# Patient Record
Sex: Male | Born: 1965 | Race: White | Hispanic: No | Smoking: Former smoker
Health system: Southern US, Academic
[De-identification: ages and names within clinical notes are randomized; demographics above are authoritative.]

## PROBLEM LIST (undated history)

## (undated) DIAGNOSIS — M542 Cervicalgia: Secondary | ICD-10-CM

## (undated) DIAGNOSIS — Z87898 Personal history of other specified conditions: Secondary | ICD-10-CM

## (undated) DIAGNOSIS — R29898 Other symptoms and signs involving the musculoskeletal system: Secondary | ICD-10-CM

## (undated) DIAGNOSIS — Z973 Presence of spectacles and contact lenses: Secondary | ICD-10-CM

## (undated) DIAGNOSIS — K069 Disorder of gingiva and edentulous alveolar ridge, unspecified: Secondary | ICD-10-CM

## (undated) DIAGNOSIS — M109 Gout, unspecified: Secondary | ICD-10-CM

## (undated) DIAGNOSIS — I679 Cerebrovascular disease, unspecified: Secondary | ICD-10-CM

## (undated) DIAGNOSIS — Z9981 Dependence on supplemental oxygen: Secondary | ICD-10-CM

## (undated) DIAGNOSIS — E785 Hyperlipidemia, unspecified: Secondary | ICD-10-CM

## (undated) DIAGNOSIS — M4802 Spinal stenosis, cervical region: Secondary | ICD-10-CM

## (undated) DIAGNOSIS — G8929 Other chronic pain: Secondary | ICD-10-CM

## (undated) DIAGNOSIS — J449 Chronic obstructive pulmonary disease, unspecified: Secondary | ICD-10-CM

## (undated) DIAGNOSIS — M5416 Radiculopathy, lumbar region: Secondary | ICD-10-CM

## (undated) DIAGNOSIS — R6889 Other general symptoms and signs: Secondary | ICD-10-CM

## (undated) DIAGNOSIS — R269 Unspecified abnormalities of gait and mobility: Secondary | ICD-10-CM

## (undated) DIAGNOSIS — K219 Gastro-esophageal reflux disease without esophagitis: Secondary | ICD-10-CM

## (undated) DIAGNOSIS — N200 Calculus of kidney: Secondary | ICD-10-CM

## (undated) DIAGNOSIS — Z87448 Personal history of other diseases of urinary system: Secondary | ICD-10-CM

## (undated) DIAGNOSIS — M545 Low back pain, unspecified: Secondary | ICD-10-CM

## (undated) HISTORY — DX: Calculus of kidney: N20.0

## (undated) HISTORY — DX: Cervicalgia: M54.2

## (undated) HISTORY — PX: HX CERVICAL SPINE SURGERY: 2100001197

## (undated) HISTORY — DX: Cerebrovascular disease, unspecified: I67.9

## (undated) HISTORY — DX: Gout, unspecified: M10.9

## (undated) HISTORY — DX: Low back pain, unspecified: M54.50

## (undated) HISTORY — DX: Radiculopathy, lumbar region: M54.16

## (undated) HISTORY — DX: Unspecified abnormalities of gait and mobility: R26.9

## (undated) HISTORY — DX: Chronic obstructive pulmonary disease, unspecified: J44.9

## (undated) HISTORY — PX: HX HERNIA REPAIR: SHX51

## (undated) HISTORY — DX: Chronic obstructive pulmonary disease, unspecified (CMS HCC): J44.9

## (undated) HISTORY — DX: Spinal stenosis, cervical region: M48.02

## (undated) HISTORY — PX: LITHOTRIPSY: SUR834

## (undated) HISTORY — DX: Other symptoms and signs involving the musculoskeletal system: R29.898

## (undated) HISTORY — PX: HX FOOT SURGERY: 2100001154

---

## 1992-08-11 ENCOUNTER — Ambulatory Visit (HOSPITAL_COMMUNITY): Payer: Self-pay | Admitting: INTERNAL MEDICINE

## 2011-11-06 DIAGNOSIS — A4902 Methicillin resistant Staphylococcus aureus infection, unspecified site: Secondary | ICD-10-CM

## 2011-11-06 HISTORY — DX: Methicillin resistant Staphylococcus aureus infection, unspecified site: A49.02

## 2019-02-19 ENCOUNTER — Inpatient Hospital Stay (HOSPITAL_COMMUNITY)
Admission: EM | Admit: 2019-02-19 | Discharge: 2019-02-19 | Disposition: A | Discharge status: Home / Self Care | Payer: Charity | Source: Other Acute Inpatient Hospital

## 2019-02-19 DIAGNOSIS — K047 Periapical abscess without sinus: Secondary | ICD-10-CM

## 2019-02-19 DIAGNOSIS — J329 Chronic sinusitis, unspecified: Secondary | ICD-10-CM

## 2019-08-03 ENCOUNTER — Inpatient Hospital Stay (HOSPITAL_COMMUNITY)
Admission: EM | Admit: 2019-08-03 | Discharge: 2019-08-03 | Disposition: A | Discharge status: Home / Self Care | Payer: Charity | Source: Other Acute Inpatient Hospital

## 2019-08-03 DIAGNOSIS — X58XXXA Exposure to other specified factors, initial encounter: Secondary | ICD-10-CM

## 2019-08-03 DIAGNOSIS — S2231XA Fracture of one rib, right side, initial encounter for closed fracture: Secondary | ICD-10-CM

## 2019-08-03 DIAGNOSIS — R0781 Pleurodynia: Secondary | ICD-10-CM

## 2019-08-12 ENCOUNTER — Inpatient Hospital Stay (HOSPITAL_COMMUNITY)
Admission: EM | Admit: 2019-08-12 | Discharge: 2019-08-12 | Disposition: A | Discharge status: Home / Self Care | Payer: Charity | Source: Other Acute Inpatient Hospital

## 2019-08-12 DIAGNOSIS — R0600 Dyspnea, unspecified: Secondary | ICD-10-CM

## 2019-08-12 DIAGNOSIS — J9600 Acute respiratory failure, unspecified whether with hypoxia or hypercapnia: Secondary | ICD-10-CM

## 2019-08-12 DIAGNOSIS — J9801 Acute bronchospasm: Secondary | ICD-10-CM

## 2019-08-12 DIAGNOSIS — F10139 Alcohol abuse with withdrawal, unspecified (CMS HCC): Secondary | ICD-10-CM

## 2019-08-12 DIAGNOSIS — J441 Chronic obstructive pulmonary disease with (acute) exacerbation: Secondary | ICD-10-CM

## 2019-08-12 DIAGNOSIS — S2231XA Fracture of one rib, right side, initial encounter for closed fracture: Secondary | ICD-10-CM

## 2019-08-12 DIAGNOSIS — R0789 Other chest pain: Secondary | ICD-10-CM

## 2020-01-27 ENCOUNTER — Inpatient Hospital Stay (HOSPITAL_COMMUNITY)
Admission: EM | Admit: 2020-01-27 | Discharge: 2020-01-27 | Disposition: A | Discharge status: Home / Self Care | Payer: No Typology Code available for payment source | Source: Other Acute Inpatient Hospital

## 2020-01-27 DIAGNOSIS — M545 Low back pain: Secondary | ICD-10-CM

## 2020-01-27 DIAGNOSIS — M5416 Radiculopathy, lumbar region: Secondary | ICD-10-CM

## 2020-01-27 DIAGNOSIS — X509XXA Other and unspecified overexertion or strenuous movements or postures, initial encounter: Secondary | ICD-10-CM

## 2020-01-27 DIAGNOSIS — M48061 Spinal stenosis, lumbar region without neurogenic claudication: Secondary | ICD-10-CM

## 2020-05-06 ENCOUNTER — Inpatient Hospital Stay (HOSPITAL_COMMUNITY)
Admission: EM | Admit: 2020-05-06 | Discharge: 2020-05-06 | Disposition: A | Discharge status: Home / Self Care | Payer: No Typology Code available for payment source | Source: Other Acute Inpatient Hospital

## 2020-05-06 DIAGNOSIS — M545 Low back pain: Secondary | ICD-10-CM

## 2021-08-12 ENCOUNTER — Emergency Department (HOSPITAL_COMMUNITY): Payer: Self-pay

## 2021-08-12 ENCOUNTER — Emergency Department
Admission: EM | Admit: 2021-08-12 | Discharge: 2021-08-12 | Disposition: A | Discharge status: Home / Self Care | Payer: MEDICAID | Attending: Physician Assistant | Admitting: Physician Assistant

## 2021-08-12 ENCOUNTER — Other Ambulatory Visit: Payer: Self-pay

## 2021-08-12 ENCOUNTER — Emergency Department (HOSPITAL_COMMUNITY)
Admission: RE | Admit: 2021-08-12 | Discharge: 2021-08-12 | Disposition: A | Discharge status: Home / Self Care | Payer: MEDICAID | Source: Ambulatory Visit

## 2021-08-12 ENCOUNTER — Encounter (HOSPITAL_COMMUNITY): Payer: Self-pay

## 2021-08-12 DIAGNOSIS — S93602A Unspecified sprain of left foot, initial encounter: Secondary | ICD-10-CM | POA: Insufficient documentation

## 2021-08-12 DIAGNOSIS — X501XXA Overexertion from prolonged static or awkward postures, initial encounter: Secondary | ICD-10-CM | POA: Insufficient documentation

## 2021-08-12 DIAGNOSIS — L03116 Cellulitis of left lower limb: Secondary | ICD-10-CM | POA: Insufficient documentation

## 2021-08-12 DIAGNOSIS — S99922A Unspecified injury of left foot, initial encounter: Secondary | ICD-10-CM

## 2021-08-12 DIAGNOSIS — S93609A Unspecified sprain of unspecified foot, initial encounter: Secondary | ICD-10-CM

## 2021-08-12 MED ORDER — CEPHALEXIN 500 MG CAPSULE
500.0000 mg | ORAL_CAPSULE | Freq: Four times a day (QID) | ORAL | 0 refills | Status: AC
Start: 2021-08-12 — End: 2021-08-22

## 2021-08-12 MED ORDER — KETOROLAC 60 MG/2 ML INTRAMUSCULAR SOLUTION
INTRAMUSCULAR | Status: AC
Start: 2021-08-12 — End: 2021-08-12
  Filled 2021-08-12: qty 2

## 2021-08-12 MED ORDER — HYDROCODONE 5 MG-ACETAMINOPHEN 325 MG TABLET
1.0000 | ORAL_TABLET | Freq: Four times a day (QID) | ORAL | 0 refills | Status: DC | PRN
Start: 2021-08-12 — End: 2022-06-12

## 2021-08-12 MED ORDER — HYDROCODONE 5 MG-ACETAMINOPHEN 325 MG TABLET
ORAL_TABLET | ORAL | Status: AC
Start: 2021-08-12 — End: 2021-08-12
  Filled 2021-08-12: qty 2

## 2021-08-12 MED ORDER — KETOROLAC 60 MG/2 ML INTRAMUSCULAR SOLUTION
60.0000 mg | INTRAMUSCULAR | Status: AC
Start: 2021-08-12 — End: 2021-08-12
  Administered 2021-08-12: 60 mg via INTRAMUSCULAR

## 2021-08-12 MED ORDER — HYDROCODONE 5 MG-ACETAMINOPHEN 325 MG TABLET
2.0000 | ORAL_TABLET | ORAL | Status: AC
Start: 2021-08-12 — End: 2021-08-12
  Administered 2021-08-12: 2 via ORAL

## 2021-08-12 MED ORDER — HYDROMORPHONE 1 MG/ML INJECTION WRAPPER
1.0000 mg | INJECTION | INTRAMUSCULAR | Status: DC
Start: 2021-08-12 — End: 2021-08-12

## 2021-08-12 NOTE — ED Triage Notes (Signed)
Patient states that he stepped off bed of his truck two days ago and fell and injured his left foot. Swelling and redness noted to left big toe.

## 2021-08-12 NOTE — ED Provider Notes (Signed)
[facility]  Emergency Department      Name: [name] [name]  Age and Gender: 55 y.o. male  Date of Birth: 1966-05-01  Date of Service: 08/12/2021   MRN: [id number]  PCP: No Pcp    Chief Complaint   Patient presents with   . Foot Pain       HPI:  Kycen Spalla is a 55 y.o. male presenting with left foot injury.  Patient reports yesterday he was working on the back of his truck when he off the back of the truck.  He states he landed on his left knee and twisted his foot and ankle.  He denies being on blood thinners.  Denies hitting his head or losing consciousness.  Patient reports that he has been able to walk on his leg, but mostly his left toe and foot have been swollen and painful.  Today he noticed it was red as well.  He denies being diabetic.  He denies any headache, dizziness, vision changes, fevers, chills, cough, chest pain, shortness of breath, abdominal pain, nausea, vomiting, diarrhea, dysuria, hematuria or any other symptoms.  Patient reports his last tetanus shot was within the last 5 years.      ROS:  Pertinent positives and negatives per HPI, all other systems reviewed and negative.       Below pertinent information reviewed with patient and/or EMR:  History reviewed. No pertinent past medical history.   Cannot display prior to admission medications because the patient has not been admitted in this contact.        No Known Allergies  History reviewed. No pertinent surgical history.  Family Medical History:    None        Social History     Tobacco Use   . Smoking status: Never   . Smokeless tobacco: Never       Objective:  ED Triage Vitals   BP (Non-Invasive) 08/12/21 1453 121/74   Heart Rate 08/12/21 1453 74   Respiratory Rate 08/12/21 1453 18   Temperature 08/12/21 1453 36.7 C (98.1 F)   SpO2 08/12/21 1453 93 %   Weight 08/12/21 1454 111 kg (245 lb)   Height 08/12/21 1454 1.88 m (6\' 2" )     Nursing notes and vital signs reviewed.    Constitutional: Vital signs reviewed.  Well-developed, well-nourished, in no apparent distress.  Head: No signs of head trauma  Eyes: Sclera white and non-icteric. Pupils bilaterally equal, round and reactive to light.  Ears: Hearing grossly intact.  Mouth/throat: Oropharynx is normal.  Mucous membranes are pink and moist without erythema, tonsillar enlargement or exudate. Uvula is midline and non-edematous   Neck:  Supple, without adenopathy or meningism. Trachea is midline.  Resp: No tenderness over the precordium, symmetric chest wall expansion.  Breathing is non-labored. Breath sounds present in all lung fields.  No evidence of wheezing, rales or rhonchi.  Cardio: Normal rate and rhythm. S1 and S2 without murmurs, rubs or gallops. No JVD. No edema.  GI: no detectable tenderness.  Soft, without signs of distention. No masses palpated. No rebound or guarding. Bowel sounds normal. No CVA tenderness bilaterally.  Musculoskeletal:  Strength is 5/5 in bilateral lower extremities. peripheral pulses are 2+, sensation is intact bilaterally.  Patient is able to wiggle his toes, dorsiflexion and plantar flexion are intact.  Full painless range of motion of all other major joints.  There is some mild swelling to the dorsum of the left foot into the ankle.  There is a small superficial scratch noted to the patient's left dorsal toe.  No active bleeding or foreign body noted.  There is some erythema surrounding this.  Moves all extremities well. Extremities without clubbing or cyanosis.  Neuro:  No focal neuro, sensory or strength deficits. EOMs intact.  Speech is normal.  Follows commands.  Psychiatric: Mood is normal. Alert and oriented 3.  Skin: Pink, with normal turgor.  No rashes or lesions noted.    Labs:   Labs Reviewed - No data to display    Imaging:  XR FOOT LEFT   Final Result by Edi, Radresults In (10/08 1450)   Tissue swelling.                Radiologist location ID: [id number]         XR ANKLE LEFT   Final Result by Edi, Radresults In (10/08 1448)    SOFT TISSUE SWELLING                   Radiologist location ID: [id number]                   MDM/Course:  Patient's vitals are stable, they are afebrile and in no acute distress.   Labs and diagnostic studies reviewed and discussed with the patient/caregiver.  X-ray shows soft tissue swelling, no acute fracture.  Patient denies history of gout.  I suspect patient has cellulitis as he does have a small cut on his toe and there is erythema surrounding this.  Patient was placed on Keflex.  He is up-to-date on his tetanus shot.  He was given Norco for pain referral to Podiatry.  He was placed in an Ace wrap and postop shoe.  Patient/caregiver councelled on supportive treatment and advised to follow up with PCP and/or specialist as soon as possible. Advised to return to the ED if experiencing worsening pain, fevers, or any new or worsening symptoms. Patient/caregiver agreed with the plan and they were discharged in stable condition.       Clinical Impression:     Encounter Diagnoses   Name Primary?   . Cellulitis of left foot Yes   . Foot sprain        Medications given:  Medications Administered in the ED   ketorolac (TORADOL) 60mg /2 mL IM injection (60 mg IntraMUSCULAR Given 08/12/21 1509)   HYDROcodone-acetaminophen (NORCO) 5-325 mg per tablet (2 Tablets Oral Given 08/12/21 1519)           Disposition: Discharged       Current Discharge Medication List      START taking these medications.      Details   cephalexin 500 mg Capsule  Commonly known as: KEFLEX   500 mg, Oral, 4 TIMES DAILY  Qty: 40 Capsule  Refills: 0     HYDROcodone-acetaminophen 5-325 mg Tablet  Commonly known as: NORCO   1 Tablet, Oral, EVERY 6 HOURS PRN  Qty: 12 Tablet  Refills: 0            Follow up:    Center, [facility]  [address]  [place]  [phone]    Schedule an appointment as soon as possible for a visit       [phone]  [address]  [address] [place]  [phone]    Schedule an appointment as  soon as possible for a visit         [phone],  PA-C  08/12/2021, 14:22     Parts of this patients chart were completed in a retrospective fashion due to simultaneous direct patient care activities in the Emergency Department.   This note was partially generated using MModal Fluency Direct system, and there may be some incorrect words, spellings, and punctuation that were not noted in checking the note before saving.

## 2021-08-12 NOTE — ED Nurses Note (Signed)
Patient asked about our charity form contacted registration.

## 2021-08-12 NOTE — ED Nurses Note (Signed)
Patient discharged home with family.  AVS reviewed with patient/care giver.  A written copy of the AVS and discharge instructions was given to the patient/care giver.  Questions sufficiently answered as needed.  Patient/care giver encouraged to follow up with PCP as indicated.  In the event of an emergency, patient/care giver instructed to call 911 or go to the nearest emergency room.

## 2021-08-16 ENCOUNTER — Ambulatory Visit: Payer: MEDICAID | Attending: Foot & Ankle Surgery | Admitting: Foot & Ankle Surgery

## 2021-08-16 ENCOUNTER — Ambulatory Visit (HOSPITAL_COMMUNITY): Payer: MEDICAID

## 2021-08-16 ENCOUNTER — Encounter (INDEPENDENT_AMBULATORY_CARE_PROVIDER_SITE_OTHER): Payer: Self-pay | Admitting: Foot & Ankle Surgery

## 2021-08-16 ENCOUNTER — Other Ambulatory Visit: Payer: Self-pay

## 2021-08-16 VITALS — Ht 74.02 in | Wt 245.0 lb

## 2021-08-16 DIAGNOSIS — M79672 Pain in left foot: Secondary | ICD-10-CM | POA: Insufficient documentation

## 2021-08-16 DIAGNOSIS — L02612 Cutaneous abscess of left foot: Secondary | ICD-10-CM | POA: Insufficient documentation

## 2021-08-16 DIAGNOSIS — L0390 Cellulitis, unspecified: Secondary | ICD-10-CM

## 2021-08-16 DIAGNOSIS — F17210 Nicotine dependence, cigarettes, uncomplicated: Secondary | ICD-10-CM

## 2021-08-16 DIAGNOSIS — L03116 Cellulitis of left lower limb: Secondary | ICD-10-CM

## 2021-08-16 LAB — C-REACTIVE PROTEIN(CRP),INFLAMMATION: CRP INFLAMMATION: 29.6 mg/L — ABNORMAL HIGH (ref <10.0)

## 2021-08-16 LAB — BASIC METABOLIC PANEL
ANION GAP: 8 mmol/L (ref 5–19)
BUN/CREA RATIO: 13 (ref 6–20)
BUN: 10 mg/dL (ref 9–20)
CALCIUM: 9.5 mg/dL (ref 8.4–10.2)
CHLORIDE: 106 mmol/L (ref 98–107)
CO2 TOTAL: 27 mmol/L (ref 22–30)
CREATININE: 0.75 mg/dL (ref 0.66–1.20)
ESTIMATED GFR: >60 mL/min/{1.73_m2} (ref >60)
GLUCOSE: 102 mg/dL (ref 74–106)
POTASSIUM: 4.7 mmol/L (ref 3.5–5.1)
SODIUM: 141 mmol/L (ref 137–145)

## 2021-08-16 LAB — CBC
HCT: 40.8 % (ref 36.0–46.0)
HGB: 13.9 g/dL (ref 13.9–16.3)
MCH: 36.2 pg — ABNORMAL HIGH (ref 25.4–34.0)
MCHC: 34.2 g/dL (ref 30.0–37.0)
MCV: 106.1 fL — ABNORMAL HIGH (ref 80.0–100.0)
MPV: 7.8 fL (ref 7.5–11.5)
PLATELETS: 430 10*3/uL — ABNORMAL HIGH (ref 130–400)
RBC: 3.84 10*6/uL — ABNORMAL LOW (ref 4.30–5.90)
RDW: 14.4 % — ABNORMAL HIGH (ref 11.5–14.0)
WBC: 8.7 10*3/uL (ref 4.5–11.5)

## 2021-08-16 MED ORDER — MELOXICAM 15 MG TABLET
15.0000 mg | ORAL_TABLET | Freq: Every day | ORAL | 0 refills | Status: AC
Start: 2021-08-16 — End: 2021-08-30

## 2021-08-16 MED ORDER — DOXYCYCLINE HYCLATE 100 MG CAPSULE
100.0000 mg | ORAL_CAPSULE | Freq: Two times a day (BID) | ORAL | 0 refills | Status: AC
Start: 2021-08-16 — End: 2021-08-26

## 2021-08-16 NOTE — Nursing Note (Signed)
Pain Left Great Toe Implant 10 years ago DOI 08/09/2021 [facility] ER 08/12/2021 Patient presents in a surgical shoe. Patient states he has increased pain when walking. Patient states he believes he has an infection. Patient states that his ankle is swelling now, its red and hot to the touch.

## 2021-08-16 NOTE — Procedures (Signed)
PODIATRY, [facility]  [address]  [place] [address]  Operated by [facility]  Procedure Note    Name: Tupac Jeffus MRN:  [id number]   Date: 08/16/2021 Age: 55 y.o.       Joint Asp/Inj    Date/Time: 08/16/2021 12:43 PM  Performed by: [name], DPM  Authorized by: [name], DPM     Consent:     Consent obtained:  Verbal    Consent given by:  Patient    Risks discussed:  Bleeding, incomplete drainage, infection and pain    Alternatives discussed:  No treatment and observation  Location:     Location:  Toe    Toe:  Great toe    Great toe joint:  L great MTP  Anesthesia (see MAR for exact dosages):     Anesthesia method:  Local infiltration    Local anesthetic:  Bupivacaine 0.5% w/o epi  Procedure details:     Needle gauge:  18 G    Ultrasound guidance: no      Approach:  Medial    Aspirate amount:  109mL    Aspirate characteristics:  Cloudy, purulent and yellow    Steroid injected: no      Specimen collected: yes    Post-procedure details:     Dressing:  Gauze roll and sterile dressing    Patient tolerance of procedure:  Tolerated well, no immediate complications        [name], DPM

## 2021-08-16 NOTE — Progress Notes (Signed)
Podiatric Surgery Clinic Note    Encounter Date: 08/16/2021  Patient Name: [name] [name]  Date of Birth: Jul 29, 1966  MRN: [id number]    Chief complaint #1:   Chief Complaint   Patient presents with   . Establish Care     Pain Left great toe.       HPI: [name] [name] is a 55 y.o. male who presents to the office for new onset, and worsening pain and redness to his left foot.  He states that on 08/11/2021, he fell off the back of the truck and twisted his foot.  He went to the emergency department and radiographs were taken that were noted to be negative for fracture.  He was started on cephalexin.  He states that since then, he has been getting worsening redness and swelling to his foot along with worsening pain.  Of note, patient has a 1st MPJ implant that was placed about 10 years ago, and at that time he states that he developed "MRSA and a bone infection".  He states that until last week, he had not had any problems with this foot.  He denies any history of gout.  He denies any open wounds after this recent fall but did have a small abrasion to the top of his toe.  He denies any nausea vomiting or fevers at present.  Patient states that he does not have a primary care doctor or insurance, but overall believes he is healthy without any medical conditions.  He denies any history of diabetes. He is a current tobacco smoker.      Current Outpatient Medications   Medication Sig   . cephalexin (KEFLEX) 500 mg Oral Capsule Take 1 Capsule (500 mg total) by mouth in the morning and 1 Capsule (500 mg total) at noon and 1 Capsule (500 mg total) in the evening and 1 Capsule (500 mg total) before bedtime. Do all this for 10 days.   Marland Kitchen doxycycline hyclate (VIBRAMYCIN) 100 mg Oral Capsule Take 1 Capsule (100 mg total) by mouth in the morning and 1 Capsule (100 mg total) before bedtime. Do all this for 10 days.   Marland Kitchen HYDROcodone-acetaminophen (NORCO) 5-325 mg Oral Tablet Take 1 Tablet by mouth Every 6 hours as needed for  Pain   . meloxicam (MOBIC) 15 mg Oral Tablet Take 1 Tablet (15 mg total) by mouth Once a day for 14 days     No Known Allergies   History reviewed. No pertinent past medical history.    Past Surgical History:   Procedure Laterality Date   . HX FOOT SURGERY         Social History     Tobacco Use   . Smoking status: Every Day     Types: Cigarettes   . Smokeless tobacco: Never   Substance Use Topics   . Alcohol use: Never       Physical Exam:  Vitals:    08/16/21 1008   Weight: 111 kg (245 lb)   Height: 1.88 m (6' 2.02")   BMI: 31.51          General: NAD, AAOx3  Left lower extremity examination:  Vascular exam: DP/PT pulses Palpable. normal CRT to all digits. Foot is warm to touch. Pedal hair growth is present.  Presence of varicosities; nonpitting edema to the left foot.  There is fluctuance noted to the dorsal 1st MPJ.  Neurological exam: Light touch sensation is normal,  no clonus, no allodynia  Dermatological exam:  To the left foot, there is erythema to the dorsal 1st MPJ that extends laterally.  There is no streaking cellulitis or ascending lymphangitis.  There is fluctuance noted to the dorsal 1st MPJ.  There is no drainage identified.  There is a superficial abrasion noted to the dorsal hallux.  No interspace wounds.  No plantar wounds.  Musculoskeletal exam:  To the left foot, hallux is rectus.  There is no subluxation or dislocation noted.  There is significant pain with attempted range of motion at the 1st MPJ and with palpation to this area.    Imaging:  Three views of the left ankle and three views of the left foot were evaluated from 08/12/2021.  There is no fractures identified.  There is a hemi-implant to the proximal phalanx of the 1st MPJ.  There is no subluxation or dislocation noted.  The 1st MPJ joint spaces completely narrowed.  No soft tissue air noted.  No cortical erosions.    Assessment and Plan:  (L03.90) Cellulitis, unspecified cellulitis site  (primary encounter diagnosis)  Plan:  doxycycline hyclate (VIBRAMYCIN) 100 mg Oral         Capsule, ANAEROBIC CULTURE, STERILE SITE         CULTURE AND GRAM STAIN, AEROBIC, SYNOVIAL         (JOINT) FLUID CRYSTALS, C-REACTIVE         PROTEIN(CRP),INFLAMMATION, CBC, BASIC METABOLIC        PANEL    (M79.672) Left foot pain  Plan: meloxicam (MOBIC) 15 mg Oral Tablet, DME -         OTHER    (L02.612) Abscess of left foot  Plan: ANAEROBIC CULTURE, STERILE SITE CULTURE AND         GRAM STAIN, AEROBIC, SYNOVIAL (JOINT) FLUID         CRYSTALS       Today we discussed treatment and medical management of patient's condition, 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  This is a new complicated condition with an unknown prognosis at this time.  Patient has acute onset cellulitis to the left foot following a traumatic injury.  Unlikely to be gout given the onset and lack of history in the past.  Patient has presence of hardware to his 1st MPJ and a history of infection to this area, it is possible that this trauma has reactivated the infection.  The fluctuance noted to the dorsal aspect of the left foot is concerning for a septic joint, and was aspirated today.  This fluid will be sent for culturing including aerobic anaerobic as well as [name] [name] analysis.  We will start patient on doxycycline for broad-spectrum coverage and MRSA coverage.  I have also given patient a prescription for meloxicam to take once daily for pain control.  We will check infectious labs including CBC and CRP.  Patient does not have a primary care doctor and therefore I would like to also check a BMP to evaluate the status of his renal function.  I advised patient that he needs to decrease his smoking to allow for increased healing.  Patient understands the moderate risk of this treatment. Patient is at risk for worsening of his condition including sepsis, and limb loss.  Due to pain at present, I have also recommended stabilization of this condition with a walking boot.  I recommended patient return to the  office on Monday for re-evaluation.  Should there be no resolution with use of antibiotics, patient may require formal incision and drainage.  Delorse Limber, DPM    This note was partially created using M*Modal fluency direct system (voice recognition software ) and is inherently subject to errors including those of syntax and "sound- alike" substitutions which may escape proofreading.  In such instances, original meaning may be extrapolated by contextual derivation.

## 2021-08-17 ENCOUNTER — Telehealth (HOSPITAL_COMMUNITY): Payer: Self-pay | Admitting: Foot & Ankle Surgery

## 2021-08-17 NOTE — Telephone Encounter (Signed)
I reviewed lab results with patient.  CRP is mildly elevated to 29, WBC within normal limits, BMP within normal limits.  There is no growth on the cultures at this time.  Patient states he felt like he may have a fever last night, but this morning he feels normal.  He states the foot is still red.  He started the doxycycline last night.  At this point, culture is still pending, unclear if this is due to septic joint versus possible gout.  Patient to continue with anti-inflammatory medication antibiotics.  We will continue to watch the cultures.  I advised patient if he does develop any fever, nausea, chills, worsening pain redness of the foot he should report immediately to emergency department.

## 2021-08-18 ENCOUNTER — Telehealth (HOSPITAL_COMMUNITY): Payer: Self-pay | Admitting: Foot & Ankle Surgery

## 2021-08-18 DIAGNOSIS — M79671 Pain in right foot: Secondary | ICD-10-CM

## 2021-08-18 DIAGNOSIS — M109 Gout, unspecified: Secondary | ICD-10-CM

## 2021-08-18 LAB — SYNOVIAL (JOINT) FLUID CRYSTALS

## 2021-08-18 MED ORDER — METHYLPREDNISOLONE 4 MG TABLETS IN A DOSE PACK
ORAL_TABLET | ORAL | 1 refills | Status: DC
Start: 2021-08-18 — End: 2022-06-12

## 2021-08-18 NOTE — Telephone Encounter (Signed)
Called patient to let him know that the culture is still negative, and the Shira Bobst analysis came back positive. He said his foot is feeling slightly better. We will treat him for presumed gout with medrol dose pak, I reviewed side effects. I think he should still continue to antibotics given him history just to be safe while we watch the cultures. Patient has an appt on Monday. He is to call with any concerns.

## 2021-08-19 LAB — STERILE SITE CULTURE AND GRAM STAIN, AEROBIC
FLC: NO GROWTH
GRAM STAIN: NONE SEEN

## 2021-08-21 ENCOUNTER — Other Ambulatory Visit: Payer: Self-pay

## 2021-08-21 ENCOUNTER — Encounter (INDEPENDENT_AMBULATORY_CARE_PROVIDER_SITE_OTHER): Payer: Self-pay | Admitting: Foot & Ankle Surgery

## 2021-08-21 ENCOUNTER — Ambulatory Visit: Payer: MEDICAID | Attending: Foot & Ankle Surgery | Admitting: Foot & Ankle Surgery

## 2021-08-21 VITALS — Ht 74.02 in | Wt 245.0 lb

## 2021-08-21 DIAGNOSIS — M109 Gout, unspecified: Secondary | ICD-10-CM

## 2021-08-21 DIAGNOSIS — M79672 Pain in left foot: Secondary | ICD-10-CM

## 2021-08-21 LAB — ANAEROBIC CULTURE: ANAEROBIC CULTURE: NO GROWTH

## 2021-08-21 NOTE — Progress Notes (Signed)
Podiatric Surgery Clinic Note    Encounter Date: 08/21/2021  Patient Name: [name] [name]  Date of Birth: [birth year]  MRN: [id number]    Chief complaint #1:   Chief Complaint   Patient presents with   . Follow Up     Left great Toe.       HPI: [name] [name] is a 55 y.o. male who returns to the office for evaluation of left foot infection. He had an injury on 08/11/21 that involved falling off the back of his truck, radiographs were negative but he had findings concerning for infection to his foot and was originally started on keflex by the ED. He was seen in the office on 08/16/21 and underwent aspiration of fluid at the 1st MPJ that was sent for culture and fluid analysis. He was switched to doxycycline. Lab work was taken that was significant for a CRP of 29.6, WBC wnl, and BMP wnl.  [name] [name] analysis was positive for monosodium urate.  Patient was started on a Medrol Dosepak for treatment of possible gout.  He states that he has only been taking 1 steroid pill a day because he was not sure how to take it.  He states that the foot pain and redness has gotten significantly better.  He denies any fevers.    Current Outpatient Medications   Medication Sig   . cephalexin (KEFLEX) 500 mg Oral Capsule Take 1 Capsule (500 mg total) by mouth in the morning and 1 Capsule (500 mg total) at noon and 1 Capsule (500 mg total) in the evening and 1 Capsule (500 mg total) before bedtime. Do all this for 10 days.   Marland Kitchen doxycycline hyclate (VIBRAMYCIN) 100 mg Oral Capsule Take 1 Capsule (100 mg total) by mouth in the morning and 1 Capsule (100 mg total) before bedtime. Do all this for 10 days.   Marland Kitchen HYDROcodone-acetaminophen (NORCO) 5-325 mg Oral Tablet Take 1 Tablet by mouth Every 6 hours as needed for Pain   . meloxicam (MOBIC) 15 mg Oral Tablet Take 1 Tablet (15 mg total) by mouth Once a day for 14 days   . Methylprednisolone (MEDROL DOSEPACK) 4 mg Oral Tablets, Dose Pack Take as instructed.     No Known Allergies   History  reviewed. No pertinent past medical history.      Past Surgical History:   Procedure Laterality Date   . HX FOOT SURGERY           Social History     Tobacco Use   . Smoking status: Every Day     Types: Cigarettes   . Smokeless tobacco: Never   Substance Use Topics   . Alcohol use: Never       Physical Exam:  Vitals:    08/21/21 1044   Weight: 111 kg (245 lb)   Height: 1.88 m (6' 2.02")   BMI: 31.51            General: NAD, AAOx3  Left lower extremity examination:  Vascular exam: DP/PT pulses Palpable. normal CRT to all digits. Foot is cool to touch. Pedal hair growth is present.  Moderate varicosities; minimal edema   Neurological exam: Light touch sensation is normal,  no clonus  Dermatological exam: Normal skin texture and turgor; no lesions or wounds; interspaces are clean and dry; no calor  Musculoskeletal exam:  To the left lower extremity, there has been reduction in pain to the 1st MPJ noted.  There is minimal edema noted.  Hallux is  rectus.    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  He is had significant improvement in symptoms.  I have reviewed his lab work and [name] [name] analysis with patient, findings are consistent with gout.  We discussed treatment of gout as well as diet modification.  I have discussed with patient that he should begin taking Medrol Dosepak as prescribed, and he was given directions today.  Patient can discontinue antibiotics at this time as cultures have grown back negative.  Patient to continue with supportive shoe gear.  I have recommended patient establish care with a primary care doctor for further management of gout in the future.  If he has repeat flare, he would benefit from uric acid level testing as well as possible allopurinol.  I have recommended patient increase his hydration throughout the day with water.  I have also recommended 1  oz of dark cherry juice concentrate daily as a natural way to lower uric acid levels.  Patient to call with any questions or concerns.      [name], DPM    This note was partially created using M*Modal fluency direct system (voice recognition software ) and is inherently subject to errors including those of syntax and "sound- alike" substitutions which may escape proofreading.  In such instances, original meaning may be extrapolated by contextual derivation.

## 2021-08-21 NOTE — Nursing Note (Signed)
Follow up Left great toe. Patient states he is doing much better. He is able to get his shoe on. Patient states he has very little pain. Patient states he has no new concerns.

## 2022-04-27 IMAGING — MR MRI LSPINE WO CONTRAST
5 of 6 series · 29 of 48 positions shown · IV contrast (agent unspecified)
Comparison: Lumbar spine radiographs 05/27/2020

HISTORY: 55 year-old male with chronic lower back pain
TECHNIQUE: Multiplanar, multisequence MR images of the lumbar spine were obtained without intravenous contrast.

CONTRAST: None.

[Series 2: bSSFP · axial · 8.0mm · 1.37mm/px · z∈[-61,+175]mm · 11 of 25 slices shown]
[im 1/25]
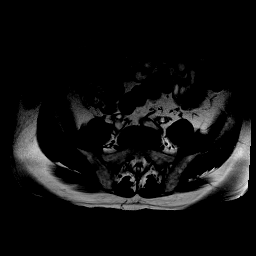
[im 3/25]
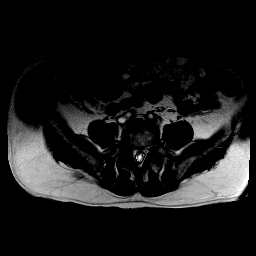
[im 5/25]
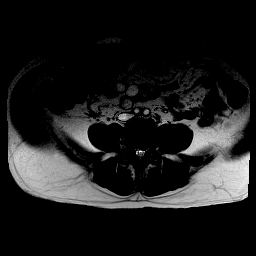
[im 8/25]
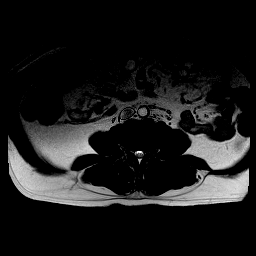
[im 10/25]
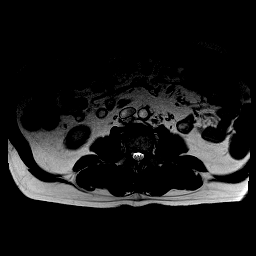
[im 13/25]
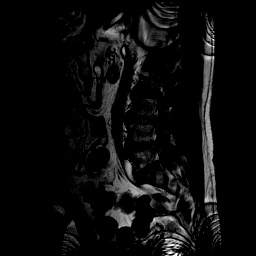
[im 15/25]
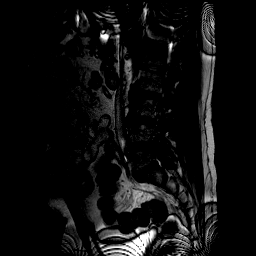
[im 17/25]
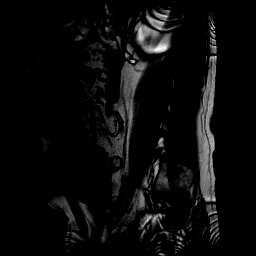
[im 20/25]
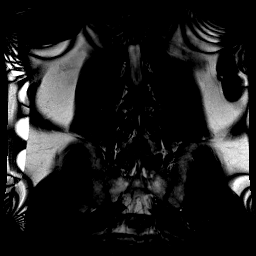
[im 22/25]
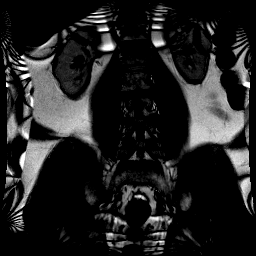
[im 25/25]
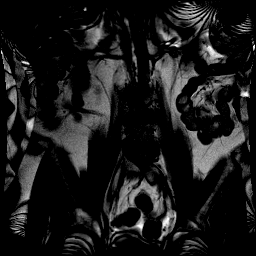

[Series 3: t2_sag · sagittal · 4.0mm · 0.38mm/px · 5 of 14 slices shown]
[im 1/14]
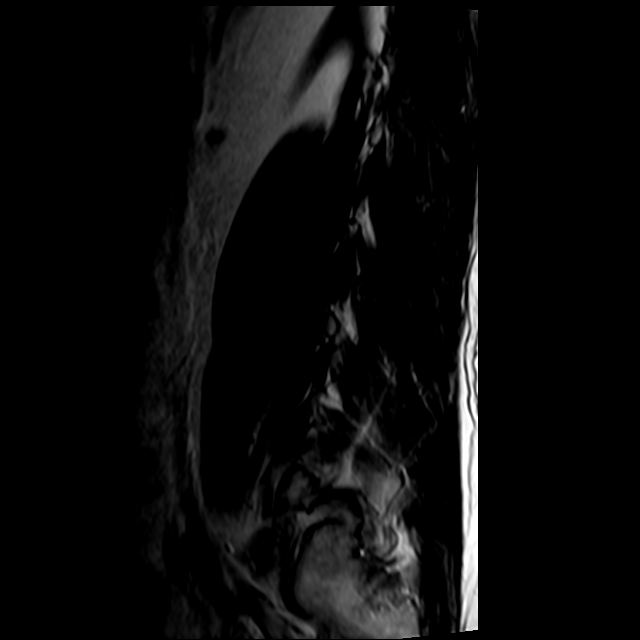
[im 4/14]
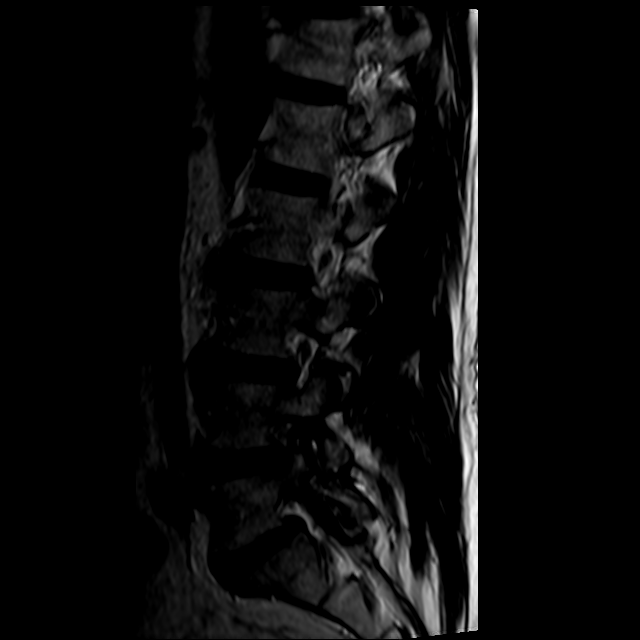
[im 7/14]
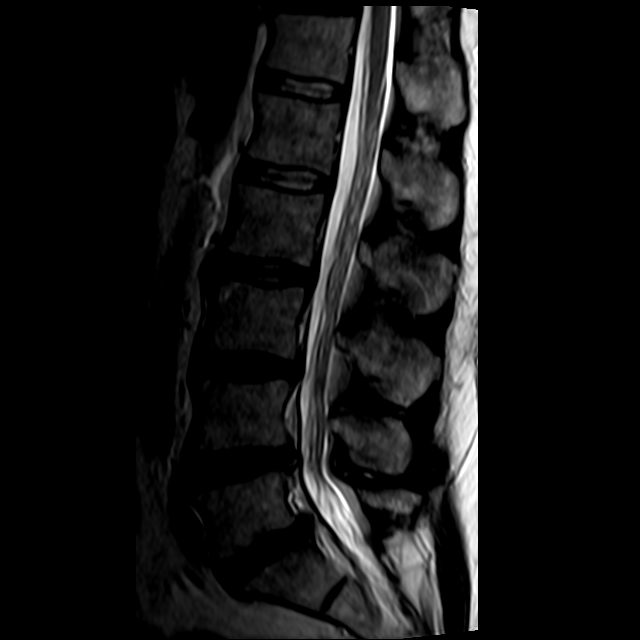
[im 10/14]
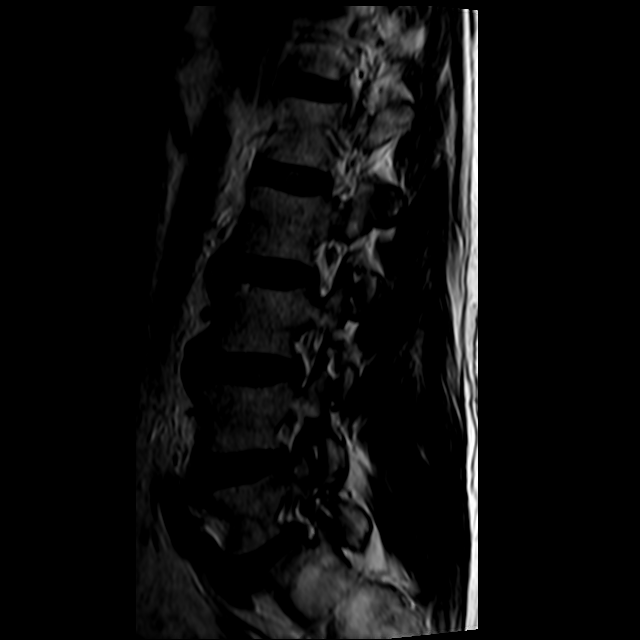
[im 14/14]
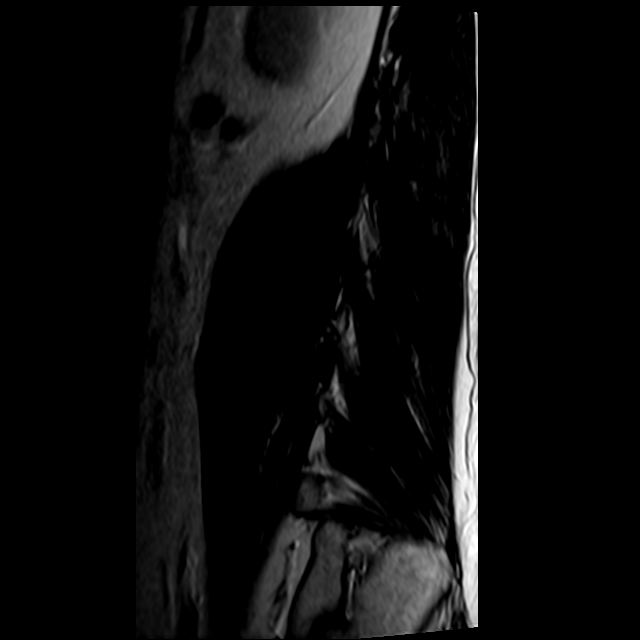

[Series 4: t1_sag · sagittal · 4.0mm · 0.75mm/px · 5 of 14 slices shown]
[im 1/14]
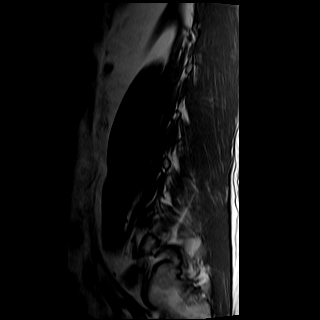
[im 4/14]
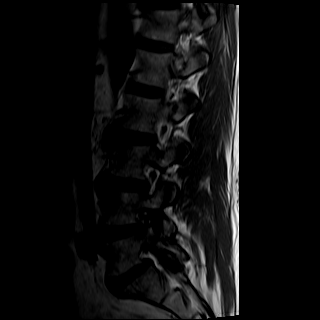
[im 7/14]
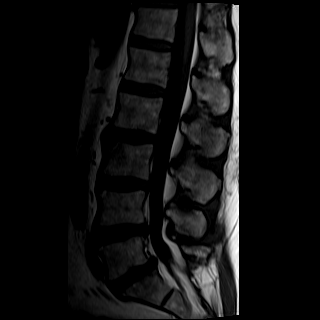
[im 10/14]
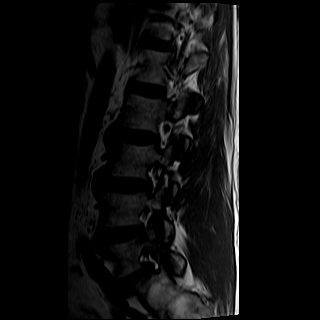
[im 14/14]
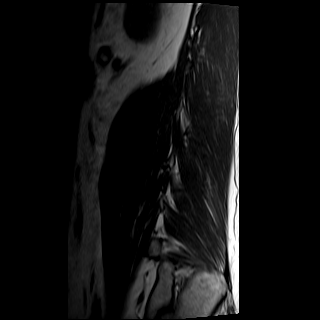

[Series 5: ir_sag · sagittal · 4.0mm · 0.47mm/px · 5 of 14 slices shown]
[im 1/14]
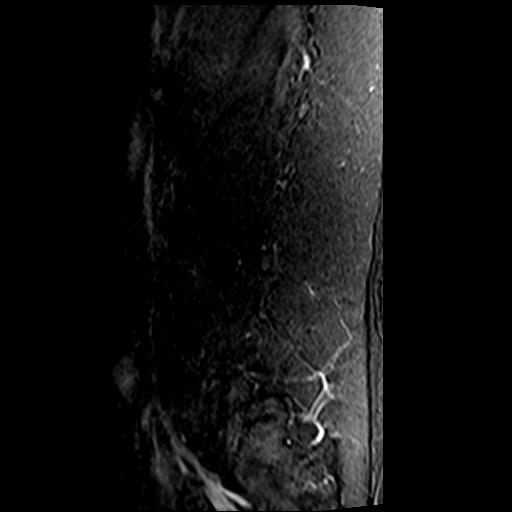
[im 4/14]
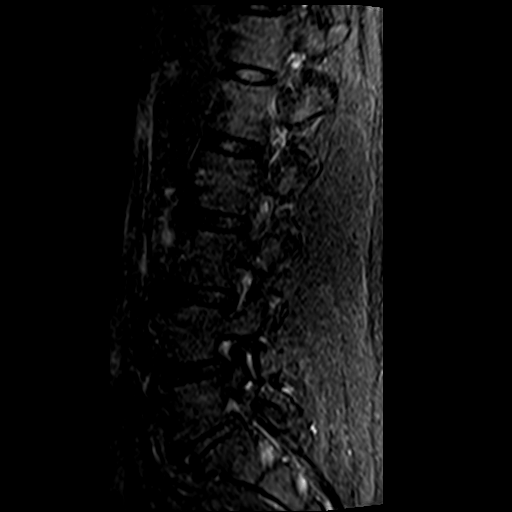
[im 7/14]
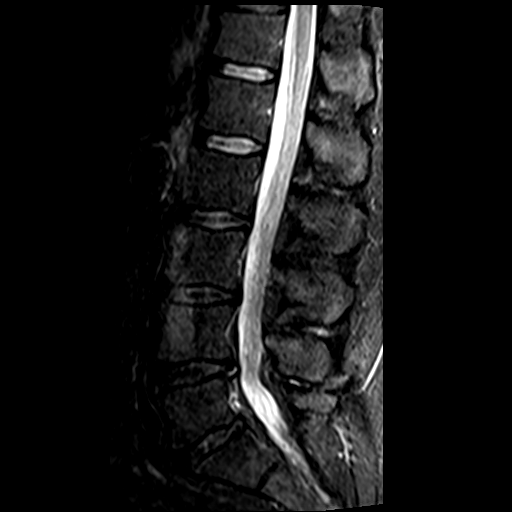
[im 10/14]
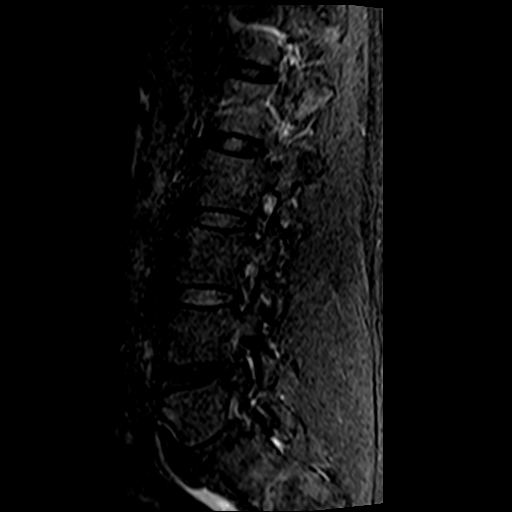
[im 14/14]
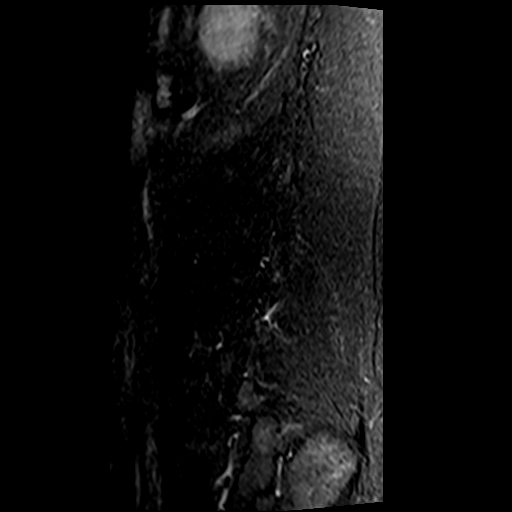

[Series 6: t1_axial_obl · axial · 4.0mm · 0.48mm/px · z∈[-91,-40]mm · 3 of 27 slices shown]
[im 1/27]
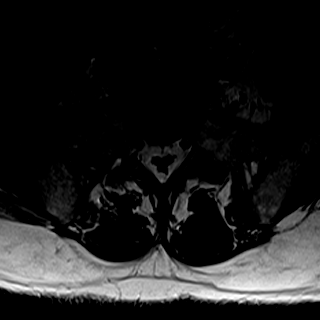
[im 3/27]
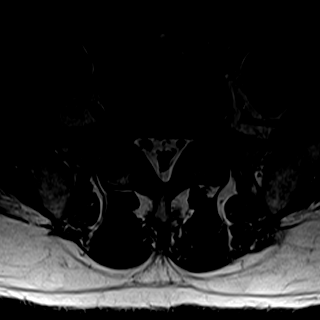
[im 9/27]
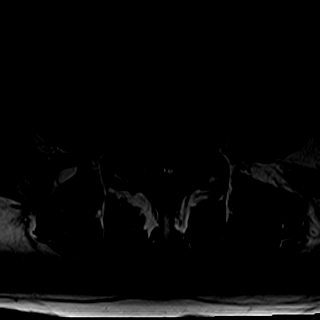

[29 of 48 positions shown; findings below may reference images not displayed]

FINDINGS: COUNT/LABELING: Please note that spinal labeling was performed assuming there are 5 non-rib-bearing lumbar type vertebrae.

ALIGNMENT: No subluxations.

VERTEBRAE:  Vertebral body heights are maintained. Minimal degenerative endplate signal changes.

INTERVERTEBRAL DISCS:  Multilevel disc desiccation without height loss.

FACET JOINTS:  Multilevel facet arthropathy, moderate at L1-2, L2-3, L3-4 and severe L4-5 and L5-S1.

PARASPINAL SOFT TISSUES:  No paraspinal soft tissue signal abnormalities.

DISTAL THORACIC SPINAL CORD AND CONUS MEDULLARIS:  The distal thoracic spinal cord is normal in caliber and signal with the conus medullaris terminating at approximately L1, which is normal.

CAUDA EQUINA NERVE ROOTS: Unremarkable.

FINDINGS BY LEVEL:

T12-L1: No high-grade canal stenosis or foraminal narrowing.

L1-L2:  No high-grade canal stenosis or foraminal narrowing.

L2-L3:  No high-grade canal stenosis or foraminal narrowing.

L3-L4:  No high-grade canal stenosis or foraminal narrowing. 

L4-L5:  Disc bulge, facet arthropathy and endplate spurring contribute to moderate right foraminal narrowing. No high-grade left foraminal narrowing or canal stenosis.

L5-S1:  Disc bulge, facet arthropathy and endplate spurring. Moderate bilateral foraminal narrowing. No high-grade canal stenosis.

OTHER: Mild bilateral SI joint osteoarthritis.
IMPRESSION: 1.
Moderate right L4-5 and moderate bilateral L5-S1 foraminal narrowing.

2.
Severe facet arthropathy at L4-5 and L5-S1.

3.
Mild bilateral SI joint osteoarthritis.

## 2022-05-27 ENCOUNTER — Emergency Department (HOSPITAL_COMMUNITY)
Admission: RE | Admit: 2022-05-27 | Discharge: 2022-05-27 | Disposition: A | Discharge status: Home / Self Care | Payer: MEDICAID | Source: Ambulatory Visit

## 2022-05-27 ENCOUNTER — Emergency Department (HOSPITAL_COMMUNITY): Payer: MEDICAID

## 2022-05-27 ENCOUNTER — Other Ambulatory Visit: Payer: Self-pay

## 2022-05-27 ENCOUNTER — Emergency Department
Admission: EM | Admit: 2022-05-27 | Discharge: 2022-05-27 | Disposition: A | Discharge status: Home / Self Care | Payer: MEDICAID | Attending: Physician Assistant | Admitting: Physician Assistant

## 2022-05-27 DIAGNOSIS — Z6832 Body mass index (BMI) 32.0-32.9, adult: Secondary | ICD-10-CM

## 2022-05-27 DIAGNOSIS — M545 Low back pain, unspecified: Secondary | ICD-10-CM

## 2022-05-27 DIAGNOSIS — F17210 Nicotine dependence, cigarettes, uncomplicated: Secondary | ICD-10-CM | POA: Insufficient documentation

## 2022-05-27 DIAGNOSIS — M47816 Spondylosis without myelopathy or radiculopathy, lumbar region: Secondary | ICD-10-CM | POA: Insufficient documentation

## 2022-05-27 DIAGNOSIS — M5441 Lumbago with sciatica, right side: Secondary | ICD-10-CM | POA: Insufficient documentation

## 2022-05-27 MED ORDER — ORPHENADRINE CITRATE 30 MG/ML INJECTION SOLUTION
60.0000 mg | INTRAMUSCULAR | Status: AC
Start: 2022-05-27 — End: 2022-05-27
  Administered 2022-05-27: 60 mg via INTRAMUSCULAR
  Filled 2022-05-27: qty 2

## 2022-05-27 MED ORDER — BACLOFEN 20 MG TABLET
20.0000 mg | ORAL_TABLET | Freq: Three times a day (TID) | ORAL | 0 refills | Status: DC | PRN
Start: 2022-05-27 — End: 2022-06-12

## 2022-05-27 MED ORDER — ORPHENADRINE CITRATE 30 MG/ML INJECTION SOLUTION
60.0000 mg | INTRAMUSCULAR | Status: DC
Start: 2022-05-27 — End: 2022-05-27

## 2022-05-27 MED ORDER — DEXAMETHASONE SODIUM PHOSPHATE (PF) 10 MG/ML INJECTION SOLUTION
10.0000 mg | INTRAMUSCULAR | Status: DC
Start: 2022-05-27 — End: 2022-05-27

## 2022-05-27 MED ORDER — KETOROLAC 30 MG/ML (1 ML) INJECTION SOLUTION
30.0000 mg | INTRAMUSCULAR | Status: DC
Start: 2022-05-27 — End: 2022-05-27

## 2022-05-27 MED ORDER — NAPROXEN 500 MG TABLET
500.0000 mg | ORAL_TABLET | Freq: Two times a day (BID) | ORAL | 0 refills | Status: DC
Start: 2022-05-27 — End: 2022-06-11

## 2022-05-27 MED ORDER — HYDROMORPHONE 1 MG/ML INJECTION WRAPPER
1.0000 mg | INJECTION | INTRAMUSCULAR | Status: AC
Start: 2022-05-27 — End: 2022-05-27
  Administered 2022-05-27: 1 mg via INTRAMUSCULAR
  Filled 2022-05-27: qty 1

## 2022-05-27 MED ORDER — KETOROLAC 30 MG/ML (1 ML) INJECTION SOLUTION
30.0000 mg | INTRAMUSCULAR | Status: AC
Start: 2022-05-27 — End: 2022-05-27
  Administered 2022-05-27: 30 mg via INTRAMUSCULAR
  Filled 2022-05-27: qty 1

## 2022-05-27 MED ORDER — DEXAMETHASONE SODIUM PHOSPHATE (PF) 10 MG/ML INJECTION SOLUTION
10.0000 mg | INTRAMUSCULAR | Status: AC
Start: 2022-05-27 — End: 2022-05-27
  Administered 2022-05-27: 10 mg via INTRAMUSCULAR
  Filled 2022-05-27: qty 1

## 2022-05-27 NOTE — ED Triage Notes (Signed)
Pt states Hx of low back pain from comp, states while brushing teeth this morning felt like a sledge hammer into low back, states worst pain ever, decreased by reliving pressure form low spine

## 2022-05-27 NOTE — ED Provider Notes (Signed)
[facility]  Emergency Department      Name: [name] [name]  Age and Gender: 56 y.o. male  Date of Birth: [birth year]  Date of Service: 05/27/2022   MRN: [id number]  PCP: No Pcp    Chief Complaint   Patient presents with    Low Back Pain       HPI:  [name] [name] is a 56 y.o. male presenting with low back pain.  Patient reports a history of low back problems for many years.  He states that it has never been this bad.  Reports that today while brushing his teeth he felt a sharp pain right in the middle of his low back.  He denies any recent injuries or falls.  He reports chronic numbness and tingling in his left foot.  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  No new numbness and tingling down his legs or in his groin.  He denies any saddle paresthesias, bowel or bladder incontinence or retention.  He did drive here today but states that he could find a ride.  He denies any headache dizziness, vision changes, fevers, chills, cough, chest pain, shortness of breath, abdominal pain, nausea vomiting diarrhea, dysuria, hematuria or any other symptoms.      ROS:  Pertinent positives and negatives per HPI, all other systems reviewed and negative.       Below pertinent information reviewed with patient and/or EMR:  No past medical history on file.  Medications Prior to Admission       Prescriptions    HYDROcodone-acetaminophen (NORCO) 5-325 mg Oral Tablet    Take 1 Tablet by mouth Every 6 hours as needed for Pain    Methylprednisolone (MEDROL DOSEPACK) 4 mg Oral Tablets, Dose Pack    Take as instructed.          No Known Allergies  Past Surgical History:   Procedure Laterality Date    HX FOOT SURGERY       Family Medical History:    None        Social History     Tobacco Use    Smoking status: Every Day     Types: Cigarettes    Smokeless tobacco: Never   Substance Use Topics    Alcohol use: Never    Drug use: Never       Objective:  ED Triage Vitals [05/27/22 1227]   BP (Non-Invasive) (!) 140/75   Heart Rate  (!) 104   Respiratory Rate 18   Temperature 36.7 C (98.1 F)   SpO2 94 %   Weight 116 kg (255 lb)   Height 1.88 m (6\' 2" )     Nursing notes and vital signs reviewed.    Constitutional: Vital signs reviewed. Well-developed, well-nourished, in no apparent distress.  Head: No signs of head trauma  Eyes: Sclera white and non-icteric. Pupils bilaterally equal, round and reactive to light.  Ears: Hearing grossly intact.  Mouth/throat: Oropharynx is normal.  Mucous membranes are pink and moist without erythema, tonsillar enlargement or exudate. Uvula is midline and non-edematous   Neck:  Supple, without adenopathy or meningism. Trachea is midline.  Resp: No tenderness over the precordium, symmetric chest wall expansion.  Breathing is non-labored. Breath sounds present in all lung fields.  No evidence of wheezing, rales or rhonchi.  Cardio: Normal rate and rhythm. S1 and S2 without murmurs, rubs or gallops. No JVD. No edema.  GI: no detectable tenderness.  Soft, without signs of distention. No masses palpated. No rebound  or guarding. Bowel sounds normal. No CVA tenderness bilaterally.  Musculoskeletal:  Strength is 5/5 bilateral upper and lower extremities.  Peripheral pulses are 2+.  Sensation is intact bilaterally.  Pain to palpation over L4-L5 of the low back without deformities or step-offs noted.  Moves all extremities well. Extremities without clubbing or cyanosis.  Neuro:  No focal neuro, sensory or strength deficits. EOMs intact.  Speech is normal.  Follows commands.  Psychiatric: Mood is normal. Alert and oriented 3.  Skin: Pink, with normal turgor.  No rashes or lesions noted.    Labs:   Labs Ordered/Reviewed - No data to display    Imaging:  XR LUMBAR SPINE SERIES   Final Result by Edi, Radresults In (07/23 1356)   MILD DEGENERATIVE CHANGES OF THE LUMBAR SPINE.               Radiologist location ID: [id number]                     MDM/Course:  Patient's vitals are stable, they are afebrile and in no acute  distress.   Labs and diagnostic studies reviewed and discussed with the patient/caregiver.  Patient is neurovascularly intact on exam.  He is able to ambulate with the Rollator that he brought.  Patient was given Toradol, Norflex and Decadron IM.  On recheck he is feeling somewhat better.  He was given IM Dilaudid.  Was given a prescription for naproxen, lidocaine patches and baclofen.  He was given referrals to neurosurgery and pain management.  Patient/caregiver councelled on supportive treatment and advised to follow up with PCP and/or specialist as soon as possible. Advised to return to the ED if experiencing worsening pain, fevers, or any new or worsening symptoms. AVS reviewed with patient. Patient/caregiver agreed with the plan and they were discharged in stable condition.       Clinical Impression:     Clinical Impression   Low back pain (Primary)       Medications given:  Medications Administered in the ED   dexamethasone (PF) 10 mg/mL injection (10 mg IntraMUSCULAR Given 05/27/22 1355)   ketorolac (TORADOL) 30 mg/mL injection (30 mg IntraMUSCULAR Given 05/27/22 1356)   orphenadrine (NORFLEX) 30 mg/mL injection (60 mg IntraMUSCULAR Given 05/27/22 1355)   HYDROmorphone (DILAUDID) 1 mg/mL injection (1 mg IntraMUSCULAR Given 05/27/22 1453)           Disposition: Discharged       Current Discharge Medication List        START taking these medications.        Details   Baclofen 20 mg Tablet  Commonly known as: LIORESAL   20 mg, Oral, 3 TIMES DAILY PRN  Qty: 15 Tablet  Refills: 0     naproxen 500 mg Tablet  Commonly known as: NAPROSYN   500 mg, Oral, 2 TIMES DAILY WITH FOOD  Qty: 30 Tablet  Refills: 0            CONTINUE these medications - NO CHANGES were made during your visit.        Details   HYDROcodone-acetaminophen 5-325 mg Tablet  Commonly known as: NORCO   1 Tablet, Oral, EVERY 6 HOURS PRN  Qty: 12 Tablet  Refills: 0     Methylprednisolone 4 mg Tablets, Dose Pack  Commonly known as: MEDROL DOSEPACK   Take as  instructed.  Qty: 21 Tablet  Refills: 1              Follow  up:    Janeann Merl, MD  [address]  [address]  [place] [address]  [phone]    Schedule an appointment as soon as possible for a visit       Philippa Sicks, MD  [address] [place] [address]  [phone]    Schedule an appointment as soon as possible for a visit         Aviva Signs, PA-C  05/27/2022, 13:23     Parts of this patients chart were completed in a retrospective fashion due to simultaneous direct patient care activities in the Emergency Department.   This note was partially generated using MModal Fluency Direct system, and there may be some incorrect words, spellings, and punctuation that were not noted in checking the note before saving.

## 2022-05-28 ENCOUNTER — Ambulatory Visit (INDEPENDENT_AMBULATORY_CARE_PROVIDER_SITE_OTHER): Payer: Self-pay

## 2022-05-28 NOTE — Nursing Note (Signed)
Spoke to patient and provided him a NPV visit with PA to follow up from his ED visit with lumbar xray.  Patient agreeable to plan.    Blima Dessert, RN

## 2022-05-28 NOTE — Telephone Encounter (Signed)
Regarding: npv - Cohen  ----- Message from Ellouise Newer sent at 05/28/2022  9:18 AM EDT -----  Pt seen in ED and I can't get him in until 06/13/22. Would like seen sooner if at all possible.

## 2022-06-05 ENCOUNTER — Encounter (HOSPITAL_COMMUNITY): Payer: Self-pay

## 2022-06-05 ENCOUNTER — Emergency Department
Admission: EM | Admit: 2022-06-05 | Discharge: 2022-06-05 | Disposition: A | Discharge status: Home / Self Care | Payer: MEDICAID | Attending: Emergency Medicine | Admitting: Emergency Medicine

## 2022-06-05 DIAGNOSIS — M5442 Lumbago with sciatica, left side: Secondary | ICD-10-CM | POA: Insufficient documentation

## 2022-06-05 DIAGNOSIS — F17210 Nicotine dependence, cigarettes, uncomplicated: Secondary | ICD-10-CM | POA: Insufficient documentation

## 2022-06-05 DIAGNOSIS — Z6832 Body mass index (BMI) 32.0-32.9, adult: Secondary | ICD-10-CM

## 2022-06-05 DIAGNOSIS — M5432 Sciatica, left side: Secondary | ICD-10-CM

## 2022-06-05 MED ORDER — LIDOCAINE 4 % TOPICAL PATCH
1.0000 | MEDICATED_PATCH | Freq: Every day | CUTANEOUS | 0 refills | Status: DC
Start: 2022-06-05 — End: 2022-06-11

## 2022-06-05 MED ORDER — LIDOCAINE 4 % TOPICAL PATCH
1.0000 | MEDICATED_PATCH | CUTANEOUS | Status: DC
Start: 2022-06-05 — End: 2022-06-05
  Administered 2022-06-05: 1 via TRANSDERMAL
  Filled 2022-06-05: qty 1

## 2022-06-05 MED ORDER — KETOROLAC 30 MG/ML (1 ML) INJECTION SOLUTION
30.0000 mg | INTRAMUSCULAR | Status: AC
Start: 2022-06-05 — End: 2022-06-05
  Administered 2022-06-05: 30 mg via INTRAMUSCULAR
  Filled 2022-06-05: qty 1

## 2022-06-05 MED ORDER — PREDNISONE 20 MG TABLET
60.0000 mg | ORAL_TABLET | Freq: Every day | ORAL | 0 refills | Status: DC
Start: 2022-06-05 — End: 2022-06-11

## 2022-06-05 MED ORDER — PREDNISONE 20 MG TABLET
60.0000 mg | ORAL_TABLET | ORAL | Status: AC
Start: 2022-06-05 — End: 2022-06-05
  Administered 2022-06-05: 60 mg via ORAL
  Filled 2022-06-05: qty 3

## 2022-06-05 MED ORDER — OXYCODONE-ACETAMINOPHEN 5 MG-325 MG TABLET
1.0000 | ORAL_TABLET | Freq: Four times a day (QID) | ORAL | 0 refills | Status: DC | PRN
Start: 2022-06-05 — End: 2022-06-11

## 2022-06-05 NOTE — ED Provider Notes (Signed)
[facility]  Emergency Department      Name: [name] [name]  Age and Gender: 56 y.o. male  Date of Birth: May 25, 1966  Date of Service: 06/05/2022   MRN: [id number]  PCP: No Pcp    Chief Complaint   Patient presents with    Back Pain       HPI:    [name] [name] is a 56 y.o. male presenting with low back pain radiating from left lower back down posterior thigh and to back of leg and into heel.  Denies any fevers or chills.  Was here last week.  Symptoms have been persistent despite medications.  He does have an appointment with his primary doctor but not until next week.  Denies any bowel or bladder incontinence.  Denies any weakness of his lower extremities.  He was able to drive here..        ROS:  Positives and negatives as per HPI otherwise 10 systems reviewed    Below pertinent information reviewed with patient and/or EMR:  History reviewed. No pertinent past medical history.  Medications Prior to Admission       Prescriptions    Baclofen (LIORESAL) 20 mg Oral Tablet    Take 1 Tablet (20 mg total) by mouth Three times a day as needed for up to 5 days    HYDROcodone-acetaminophen (NORCO) 5-325 mg Oral Tablet    Take 1 Tablet by mouth Every 6 hours as needed for Pain    Methylprednisolone (MEDROL DOSEPACK) 4 mg Oral Tablets, Dose Pack    Take as instructed.    naproxen (NAPROSYN) 500 mg Oral Tablet    Take 1 Tablet (500 mg total) by mouth Twice daily with food          No Known Allergies  Past Surgical History:   Procedure Laterality Date    HX FOOT SURGERY       Family Medical History:    None       Social History     Tobacco Use    Smoking status: Every Day     Types: Cigarettes    Smokeless tobacco: Never   Substance Use Topics    Alcohol use: Never    Drug use: Never       Objective:  ED Triage Vitals [06/05/22 1340]   BP (Non-Invasive) (!) 142/86   Heart Rate 85   Respiratory Rate (!) 22   Temp    SpO2 95 %   Weight 116 kg (255 lb)   Height 1.88 m (6\' 2" )     Nursing notes and vital signs  reviewed.    Constitutional:  No acute distress.  Alert and oriented to person, place, and time.   HENT:   Head: Normocephalic and atraumatic.   Mouth/Throat: Oropharynx is clear and moist.   Eyes: EOMI, PERRL   Neck: Trachea midline. Neck supple.  Abdominal: BS +. Abdomen soft, no tenderness, rebound or guarding.  Back: No midline spinal tenderness, no paraspinal tenderness, no CVA tenderness.           Musculoskeletal: No edema, tenderness or deformity.  Strength 5/5 in all extremities  Skin: warm and dry. No rash, erythema, pallor or cyanosis  Psychiatric: normal mood and affect. Behavior is normal.   Neurological: Patient keenly alert and responsive, CN II-XII grossly intact, moving all extremities equally and fully, normal gait.  Patient is able to stand and bear weight.  Dorsi and plantar flexion of the toes equal bilaterally.  Flexion extension of lower extremities equal bilaterally.  Able to briefly stand on toes and heels.  Able to squat.    Labs:   No results found for this or any previous visit (from the past 12 hour(s)).    Imaging:  No orders to display       EKG:     MDM/Course:    Patient without any evidence of any neurologic deficit.  Having persistent pain.  I did review the prescription drug monitoring website.  Patient has no current narcotic prescriptions.  I will continue steroids and prescribe short course of Percocet to help with the discomfort.         Clinical Impression:     Clinical Impression   Sciatica of left side (Primary)       Medications given:  Medications Administered in the ED   ketorolac (TORADOL) 30 mg/mL injection (has no administration in time range)   predniSONE (DELTASONE) tablet (has no administration in time range)           Disposition: Discharged       Current Discharge Medication List        START taking these medications.        Details   oxyCODONE-acetaminophen 5-325 mg Tablet  Commonly known as: PERCOCET   1 Tablet, Oral, EVERY 6 HOURS PRN  Qty: 12 Tablet  Refills:  0     predniSONE 20 mg Tablet  Commonly known as: DELTASONE   60 mg, Oral, DAILY  Qty: 15 Tablet  Refills: 0            CONTINUE these medications - NO CHANGES were made during your visit.        Details   HYDROcodone-acetaminophen 5-325 mg Tablet  Commonly known as: NORCO   1 Tablet, Oral, EVERY 6 HOURS PRN  Qty: 12 Tablet  Refills: 0     Methylprednisolone 4 mg Tablets, Dose Pack  Commonly known as: MEDROL DOSEPACK   Take as instructed.  Qty: 21 Tablet  Refills: 1     naproxen 500 mg Tablet  Commonly known as: NAPROSYN   500 mg, Oral, 2 TIMES DAILY WITH FOOD  Qty: 30 Tablet  Refills: 0            ASK your doctor about these medications.        Details   Baclofen 20 mg Tablet  Commonly known as: LIORESAL  Ask about: Should I take this medication?   20 mg, Oral, 3 TIMES DAILY PRN  Qty: 15 Tablet  Refills: 0              Follow up:  see your doctor as planned            Parts of this patients chart were completed in a retrospective fashion due to simultaneous direct patient care activities in the Emergency Department.   This note was partially generated using MModal Fluency Direct system, and there may be some incorrect words, spellings, and punctuation that were not noted in checking the note before saving.      Brooks Sailors, DO  06/05/2022, 14:12

## 2022-06-05 NOTE — ED Nurses Note (Signed)
Discharge instructions and prescriptions provided to patient by doctor and nurse.  He verbalized understanding.  Assisted off unit in wheelchair

## 2022-06-05 NOTE — ED Triage Notes (Signed)
Pt states that he has back pain that travels down left leg. Pt states was seen last week at [facility] for back pain as well.  Pt states that it starts low in his back goes across the back and down to his ankle.

## 2022-06-11 ENCOUNTER — Ambulatory Visit (INDEPENDENT_AMBULATORY_CARE_PROVIDER_SITE_OTHER): Payer: MEDICAID

## 2022-06-11 ENCOUNTER — Other Ambulatory Visit: Payer: Self-pay

## 2022-06-11 ENCOUNTER — Encounter (INDEPENDENT_AMBULATORY_CARE_PROVIDER_SITE_OTHER): Payer: Self-pay | Admitting: Family Medicine

## 2022-06-11 ENCOUNTER — Other Ambulatory Visit: Payer: MEDICAID | Attending: Family Medicine | Admitting: Family Medicine

## 2022-06-11 ENCOUNTER — Other Ambulatory Visit (INDEPENDENT_AMBULATORY_CARE_PROVIDER_SITE_OTHER): Payer: Self-pay | Admitting: Family Medicine

## 2022-06-11 ENCOUNTER — Ambulatory Visit (INDEPENDENT_AMBULATORY_CARE_PROVIDER_SITE_OTHER): Payer: MEDICAID | Admitting: Family Medicine

## 2022-06-11 VITALS — BP 130/76 | HR 102 | Temp 98.0°F | Ht 74.0 in | Wt 250.0 lb

## 2022-06-11 DIAGNOSIS — M2550 Pain in unspecified joint: Secondary | ICD-10-CM | POA: Insufficient documentation

## 2022-06-11 DIAGNOSIS — Z130 Encounter for screening for diseases of the blood and blood-forming organs and certain disorders involving the immune mechanism: Secondary | ICD-10-CM

## 2022-06-11 DIAGNOSIS — Z125 Encounter for screening for malignant neoplasm of prostate: Secondary | ICD-10-CM

## 2022-06-11 DIAGNOSIS — G8929 Other chronic pain: Secondary | ICD-10-CM

## 2022-06-11 DIAGNOSIS — J449 Chronic obstructive pulmonary disease, unspecified: Secondary | ICD-10-CM

## 2022-06-11 DIAGNOSIS — Z0000 Encounter for general adult medical examination without abnormal findings: Secondary | ICD-10-CM | POA: Insufficient documentation

## 2022-06-11 DIAGNOSIS — E663 Overweight: Secondary | ICD-10-CM

## 2022-06-11 DIAGNOSIS — M5416 Radiculopathy, lumbar region: Secondary | ICD-10-CM

## 2022-06-11 DIAGNOSIS — M47816 Spondylosis without myelopathy or radiculopathy, lumbar region: Secondary | ICD-10-CM | POA: Insufficient documentation

## 2022-06-11 DIAGNOSIS — Z6832 Body mass index (BMI) 32.0-32.9, adult: Secondary | ICD-10-CM

## 2022-06-11 DIAGNOSIS — F17200 Nicotine dependence, unspecified, uncomplicated: Secondary | ICD-10-CM | POA: Insufficient documentation

## 2022-06-11 DIAGNOSIS — F17210 Nicotine dependence, cigarettes, uncomplicated: Secondary | ICD-10-CM

## 2022-06-11 DIAGNOSIS — M79671 Pain in right foot: Secondary | ICD-10-CM

## 2022-06-11 LAB — URINALYSIS, MICROSCOPIC

## 2022-06-11 LAB — MANUAL DIFFERENTIAL
BAND %: 2 % (ref 0–5)
BASOPHIL %: 1 % (ref 0–1)
BASOPHIL ABSOLUTE: 0.09 10*3/uL (ref 0.00–0.30)
EOSINOPHIL %: 4 % (ref 0–4)
EOSINOPHIL ABSOLUTE: 0.38 10*3/uL (ref 0.00–0.50)
LYMPHOCYTE %: 23 % (ref 23–35)
LYMPHOCYTE ABSOLUTE: 2.16 10*3/uL (ref 0.90–4.80)
MONOCYTE %: 6 % (ref 0–12)
MONOCYTE ABSOLUTE: 0.56 10*3/uL (ref 0.30–0.90)
NEUTROPHIL %: 64 % (ref 50–70)
NEUTROPHIL ABSOLUTE: 6.2 10*3/uL (ref 1.70–7.00)
WBC MORPHOLOGY COMMENT: NORMAL
WBC: 9.4 10*3/uL

## 2022-06-11 LAB — LIPID PANEL
CHOL/HDL RATIO: 4.6
CHOLESTEROL: 210 mg/dL — ABNORMAL HIGH (ref 100–200)
HDL CHOL: 46 mg/dL — ABNORMAL LOW (ref >=50)
LDL CALC: 110 mg/dL — ABNORMAL HIGH (ref <100)
NON-HDL: 164 mg/dL (ref <=190)
TRIGLYCERIDES: 313 mg/dL — ABNORMAL HIGH (ref <150)
VLDL CALC: 53 mg/dL — ABNORMAL HIGH (ref <30)

## 2022-06-11 LAB — CBC WITH DIFF
HCT: 51.6 % (ref 42.0–52.0)
HGB: 17.3 g/dL (ref 13.5–18.0)
MCH: 33.4 pg — ABNORMAL HIGH (ref 28.0–33.0)
MCHC: 33.5 g/dL (ref 32.0–37.0)
MCV: 99.8 fL (ref 78.0–100.0)
PLATELETS: 308 10*3/uL (ref 130–400)
RBC: 5.17 10*6/uL (ref 4.70–5.40)
RDW: 16.7 % — ABNORMAL HIGH (ref 9.9–16.5)
WBC: 9.4 10*3/uL (ref 4.5–11.0)

## 2022-06-11 LAB — COMPREHENSIVE METABOLIC PNL, FASTING
ALBUMIN: 3.8 g/dL (ref 3.5–5.0)
ALKALINE PHOSPHATASE: 84 U/L (ref 45–115)
ALT (SGPT): 21 U/L (ref 10–55)
ANION GAP: 9 mmol/L (ref 4–13)
AST (SGOT): 26 U/L (ref 8–45)
BILIRUBIN TOTAL: 0.6 mg/dL (ref 0.3–1.3)
BUN/CREA RATIO: 9 (ref 6–22)
BUN: 10 mg/dL (ref 8–25)
CALCIUM: 9.6 mg/dL (ref 8.5–10.0)
CHLORIDE: 97 mmol/L (ref 96–111)
CO2 TOTAL: 31 mmol/L — ABNORMAL HIGH (ref 22–30)
CREATININE: 1.15 mg/dL (ref 0.75–1.35)
ESTIMATED GFR: 74 mL/min/BSA (ref >=60)
GLUCOSE: 94 mg/dL (ref 70–99)
POTASSIUM: 4.7 mmol/L (ref 3.5–5.1)
PROTEIN TOTAL: 7.7 g/dL (ref 6.4–8.3)
SODIUM: 137 mmol/L (ref 136–145)

## 2022-06-11 LAB — PSA SCREENING: PSA: 4.17 ng/mL — ABNORMAL HIGH (ref <=4.00)

## 2022-06-11 LAB — URINALYSIS, MACROSCOPIC
BILIRUBIN: NEGATIVE mg/dL
BLOOD: NEGATIVE mg/dL
GLUCOSE: NEGATIVE mg/dL
KETONES: NEGATIVE mg/dL
NITRITE: NEGATIVE
PH: 5.5 (ref 5.0–8.0)
PROTEIN: 50 mg/dL — AB
SPECIFIC GRAVITY: 1.025 (ref 1.002–1.030)
UROBILINOGEN: <2 mg/dL (ref <2.0)

## 2022-06-11 LAB — RHEUMATOID FACTOR, SERUM: RHEUMATOID FACTOR: <13 IU/mL (ref <=30)

## 2022-06-11 LAB — THYROID STIMULATING HORMONE (SENSITIVE TSH): TSH: 0.998 u[IU]/mL (ref 0.430–3.550)

## 2022-06-11 LAB — C-REACTIVE PROTEIN(CRP),INFLAMMATION: CRP INFLAMMATION: 3.4 mg/L (ref <8.0)

## 2022-06-11 LAB — SEDIMENTATION RATE: ERYTHROCYTE SEDIMENTATION RATE (ESR): 1 mm/hr (ref 0–15)

## 2022-06-11 LAB — URIC ACID: URIC ACID: 11.4 mg/dL — ABNORMAL HIGH (ref 3.5–7.5)

## 2022-06-11 LAB — THYROXINE, FREE (FREE T4): THYROXINE (T4), FREE: 0.99 ng/dL (ref 0.70–1.25)

## 2022-06-11 MED ORDER — MELOXICAM 15 MG TABLET
15.0000 mg | ORAL_TABLET | Freq: Every day | ORAL | 1 refills | Status: DC
Start: 2022-06-11 — End: 2023-02-15

## 2022-06-11 MED ORDER — MELOXICAM 15 MG TABLET
15.0000 mg | ORAL_TABLET | Freq: Every day | ORAL | 1 refills | Status: DC
Start: 2022-06-11 — End: 2022-06-11

## 2022-06-11 NOTE — Progress Notes (Signed)
FAMILY MEDICINE, [facility]  [address] [place] [address]       Name: [name] [name]  MRN:  [id number]       Date: 06/11/2022  DOB:    1966-06-30  Age: 56 y.o.         Reason for Visit: Establish Care, Annual Exam, and Back Pain (Usually gets Toradol shots at the ED)    History of Present Illness  [name] [name] is a 56 y.o. male who is being seen today for new patient checkup.  Has not had lab work or seen a doctor primary care for long time.  Has been going to the emergency room.  Chief complaint has been low back pain with radiation of pain down left leg without bowel or bladder dysfunction.  Hurt his back years ago was somewhat of a workman's comp injury.  Workman's comp signed off but still has low back pain.  No specific injury lately.  Has been emergency room a couple different times for this injury has had foot pain to had seen a podiatrist in the last couple years.  Had x-rays on his back recently but has not had an MRI in over 5 years.  Denies having colonoscopy or any up-to-date labs except these emergency room.  Patient smokes about a pack a day has been doing that for long time.  Low bit of cough every once in a while no fever some shortness of breath on exertion no chest pain no abdominal pain no bowel changes.       History reviewed. No pertinent past medical history.      Past Surgical History:   Procedure Laterality Date    HX FOOT SURGERY           Current Outpatient Medications   Medication Sig    Baclofen (LIORESAL) 20 mg Oral Tablet Take 1 Tablet (20 mg total) by mouth Three times a day as needed for up to 5 days    HYDROcodone-acetaminophen (NORCO) 5-325 mg Oral Tablet Take 1 Tablet by mouth Every 6 hours as needed for Pain    lidocaine 4 % Adhesive Patch, Medicated Place 1 Patch on the skin Once a day for 7 days (Patient not taking: Reported on 06/11/2022)    meloxicam (MOBIC) 15 mg Oral Tablet Take 1 Tablet (15 mg total) by mouth Once a day     Methylprednisolone (MEDROL DOSEPACK) 4 mg Oral Tablets, Dose Pack Take as instructed.    oxyCODONE-acetaminophen (PERCOCET) 5-325 mg Oral Tablet Take 1 Tablet by mouth Every 6 hours as needed for Pain     Allergies   Allergen Reactions    Naproxen Nausea/ Vomiting     Family Medical History:    None         Social History     Tobacco Use    Smoking status: Every Day     Types: Cigarettes    Smokeless tobacco: Never   Substance Use Topics    Alcohol use: Never    Drug use: Never       Nursing note  There are no exam notes on file for this visit.     Review of Systems  Review of Systems   Constitutional:  Positive for malaise/fatigue.   HENT: Negative.     Eyes: Negative.    Respiratory:  Positive for cough.    Cardiovascular: Negative.    Gastrointestinal: Negative.    Genitourinary: Negative.  Musculoskeletal:  Positive for back pain, joint pain, myalgias and neck pain.   Skin: Negative.    Neurological:  Positive for tingling.   Endo/Heme/Allergies: Negative.    Psychiatric/Behavioral:  The patient has insomnia.      Physical Exam:  BP 130/76   Pulse (!) 102   Temp 36.7 C (98 F)   Ht 1.88 m (6\' 2" )   Wt 113 kg (250 lb)   SpO2 (!) 83%   BMI 32.10 kg/m       Physical Exam  Constitutional:       Appearance: He is obese.   HENT:      Head: Normocephalic.   Eyes:      Conjunctiva/sclera: Conjunctivae normal.      Pupils: Pupils are equal, round, and reactive to light.   Cardiovascular:      Rate and Rhythm: Normal rate and regular rhythm.      Heart sounds: Normal heart sounds.   Pulmonary:      Effort: Pulmonary effort is normal.      Breath sounds: Normal breath sounds.   Abdominal:      General: Bowel sounds are normal.      Palpations: Abdomen is soft.   Musculoskeletal:         General: Tenderness present.      Cervical back: Normal range of motion and neck supple.      Comments: Some tenderness to palpation lumbar sacral area negative straight leg raising poor range of motion lumbar sacral area in  flexion extension rotation side bending deep tendon reflexes intact.    Skin:     General: Skin is warm.   Neurological:      Mental Status: He is alert and oriented to person, place, and time.      Gait: Gait is intact.   Psychiatric:         Mood and Affect: Mood and affect normal.         Cognition and Memory: Memory normal.         Judgment: Judgment normal.       Assessment and Plan    ENCOUNTER DIAGNOSES     ICD-10-CM   1. 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 Arthralgia, unspecified joint  M25.50   4. Tobacco dependence  F17.200   5. Overweight  E66.3   6. Lumbar radiculopathy  M54.16   7. Stage 1 mild COPD by GOLD classification (CMS HCC)  J44.9   8. 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  Reviewed patient's history concerning low back pain in ongoing symptoms.  Has not had physical therapy for long time going to see a pain specialist within the next couple days whom referred patient to the emergency room.  Has not had injections before.  Counseled  patient quit smoking for about 15 minutes.  We will do up-to-date outpatient labs today will do referral to physical therapy [facility] per patient.  Possibly end up doing an MRI of his lumbar sacral area possible referral to neurosurgeon depending on what the MRI shows.  Told patient to watch his diet closely and to exercise regular basis to lose weight.  Spent approximately 45 minutes with patient discussing his issues.On the day of the encounter, a total of  45 minutes was  spent on this patient encounter including review of historical information, examination, documentation and post-visit activities. The time documented excludes procedural time.  Patient had some GI distress from Naprosyn no specific shortness of breath or chest pain or mouth swelling or rash from that products.  Check the controlled substance prescription website.    Follow up: Return in about 4 weeks (around 07/09/2022).    Aylene Acoff, DO  06/11/2022, 13:07

## 2022-06-12 ENCOUNTER — Encounter (INDEPENDENT_AMBULATORY_CARE_PROVIDER_SITE_OTHER): Payer: Self-pay | Admitting: PHYSICIAN ASSISTANT

## 2022-06-12 ENCOUNTER — Ambulatory Visit (INDEPENDENT_AMBULATORY_CARE_PROVIDER_SITE_OTHER): Payer: MEDICAID | Admitting: PHYSICIAN ASSISTANT

## 2022-06-12 VITALS — BP 122/78 | HR 87 | Temp 96.8°F | Ht 74.0 in | Wt 250.0 lb

## 2022-06-12 DIAGNOSIS — M545 Low back pain, unspecified: Secondary | ICD-10-CM

## 2022-06-12 DIAGNOSIS — M4726 Other spondylosis with radiculopathy, lumbar region: Secondary | ICD-10-CM

## 2022-06-12 DIAGNOSIS — M5416 Radiculopathy, lumbar region: Secondary | ICD-10-CM

## 2022-06-12 DIAGNOSIS — M47816 Spondylosis without myelopathy or radiculopathy, lumbar region: Secondary | ICD-10-CM

## 2022-06-12 NOTE — Result Encounter Note (Signed)
Patient notified. 06/12/22 tjm

## 2022-06-12 NOTE — Progress Notes (Signed)
[facility] Department of Neurosurgery  [facility]  Outpatient     [name] [name]  Date of Service: 06/12/2022  Referring physician: Emergency, Physician, MD  No address on file    Gender: male      Chief Complaint:   Chief Complaint   Patient presents with    Lower Back Pain       History is provided by patient    History of Present Illness:  [name] is a 56 year-old male presenting as a new patient for evaluation of low back pain with radiation into bilateral posterolateral lower extremities, L>R-sided. Patient reports that his left achilles tendon "burns." He states these symptoms have been ongoing for 4-5 years since he hurt his back at work while moving a door. 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 He states pain is worse with all activity. He has completed PT in the past without significant relief. He is scheduled to begin PT next week. He has not been to Pain management. He has tried oral steroids and NSAIDs without significant relief. He denies saddle anesthesia and bowel/bladder changes. No history of spine surgery. He had recent lumbar xrays completed at [facility].          Past History:  Current Outpatient Medications   Medication Sig    meloxicam (MOBIC) 15 mg Oral Tablet Take 1 Tablet (15 mg total) by mouth Once a day     Allergies   Allergen Reactions    Naproxen Nausea/ Vomiting     Past Medical History:   Diagnosis Date    COPD (chronic obstructive pulmonary disease) (CMS HCC)     Gout     Kidney stones     Low back pain          Past Surgical History:   Procedure Laterality Date    HX FOOT SURGERY Left     jont implant left toe    HX HERNIA REPAIR      LITHOTRIPSY             Family History:  Family Medical History:    None           Social History:  Social History     Socioeconomic History    Marital status: Divorced   Tobacco Use    Smoking status: Every Day     Packs/day: 1.00     Types: Cigarettes    Smokeless tobacco: Never   Substance and Sexual Activity     Alcohol use: Yes     Comment: occ    Drug use: Never    Sexual activity: Not Currently       Review of Systems:  Other than ROS in the HPI, all other systems were negative.    Examination:  BP 122/78 (Site: Right, Patient Position: Sitting)   Pulse 87   Temp 36 C (96.8 F) (Tympanic)   Ht 1.88 m (6\' 2" )   Wt 113 kg (250 lb)   SpO2 98%   BMI 32.10 kg/m         Constitutional:   General appearance: Normal    Cardiovascular:   Auscultation: RRR.   Peripheral vascular system: Peripheral pulses 2+ bilaterally.     Lungs:  CTA bilaterally. Respiration non-labored.     Abdominal:  Soft and non tender to palpation.     Musculoskeletal:   Palpation: No tenderness noted of cervical neck or vertebral processes.   Muscle tone (upper extremities): within normal limits  Muscle tone (lower extremities): within normal limits   Arm Right Left Leg Right Left   Deltoid (shoulder abduction) 5/5 5/5 Hip Flexion 5/5 5/5   Elbow flexors 5/5 5/5 Knee Flexion 5/5 5/5   Elbow extensors 5/5 5/5 Knee Extension 5/5 5/5   Grip 5/5 5/5 Foot Dorsiflexion 5/5 5/5         Foot Plantarflexion 5/5 5/5   Gait: Posture is normal. Casual gait is steady and intact without ataxia.  Neurological:   Orientation: Alert and oriented x3.  Recent and remote memory: Good recall and able to follow commands.  Attention span and concentration: Normal in conversation.  Speech: No aphasia or dysarthria.  Fund of knowledge: Consistent with knowledge base in this setting.   Cranial Nerves:   1st: Not tested.  2nd: PERRLA.   3rd,4th,6th: EOM intact.  5th: Light touch is intact and equal bilaterally in V1-V3.   7th: Face symmetrical, no droop appreciated.   8th: Hearing is grossly intact.   9th: Palate symmetric, uvula elevates in the midline.   11th: Shoulder shrug intact and equal bilaterally.   12th: Tongue protrudes midline without fasciculations.   Motor: Appropriate muscle bulk and tone bilaterally in upper and lower extremities.   Coordination: Rapid  alternating movements of upper extremities intact bilaterally. There is no dysmetria on finger-to-nose test and heel-shin test bilaterally. No pronator drift of outstretched arms.   Sensory: Intact to light touch in all four extremities.   Reflexes:     Brachio-  radialis Biceps Triceps Patellar   Right 2+ 2+ 2+ 1+   Left 2+ 2+ 2+ 1+          Data reviewed:  Narrative & Impression   Michele Rockers Rashid     RADIOLOGIST: Carma Lair     XR LUMBAR SPINE SERIES performed on 05/27/2022 1:13 PM     CLINICAL HISTORY: pain.  back pain for the past 3 hours, denies any trauma     TECHNIQUE:  6 views of the lumbar spine.     COMPARISON: None.     FINDINGS:  Normal lumbar vertebral heights.  No evidence of fracture.  Mild multilevel disc space narrowing.     No evidence of spondylolysis.  Normal alignment.  No spondylolisthesis.     SI joints are unremarkable.        IMPRESSION:  MILD DEGENERATIVE CHANGES OF THE LUMBAR SPINE       Assessment/Plan:      ICD-10-CM    1. Lumbar back pain  M54.50 MRI SPINE LUMBOSACRAL WO CONTRAST      2. Lumbar spondylosis  M47.816 MRI SPINE LUMBOSACRAL WO CONTRAST      3. Lumbar radicular pain  M54.16 MRI SPINE LUMBOSACRAL WO CONTRAST          Imaging reviewed with patient in room.     Recommend patient continue with therapy that is scheduled next week.   Will obtain LMRI w/o at [facility]     All patient question's were answered and they verbalized agreement with plan of care.   Patient instructed to call/return to office or present to ED if symptoms change or worsen.     The patient was seen independently.    Rachel Moulds, PA-C 06/12/2022, 20:10

## 2022-06-13 ENCOUNTER — Ambulatory Visit (INDEPENDENT_AMBULATORY_CARE_PROVIDER_SITE_OTHER): Payer: Self-pay | Admitting: Neurological Surgery

## 2022-06-15 ENCOUNTER — Telehealth (INDEPENDENT_AMBULATORY_CARE_PROVIDER_SITE_OTHER): Payer: Self-pay | Admitting: Family Medicine

## 2022-06-18 ENCOUNTER — Ambulatory Visit (INDEPENDENT_AMBULATORY_CARE_PROVIDER_SITE_OTHER): Payer: MEDICAID

## 2022-06-20 ENCOUNTER — Telehealth (INDEPENDENT_AMBULATORY_CARE_PROVIDER_SITE_OTHER): Payer: Self-pay | Admitting: PHYSICIAN ASSISTANT

## 2022-06-20 NOTE — Telephone Encounter (Signed)
Called patient to let him know that his MRI is denied per insurance requiring 6 weeks of therapy. Patient missed PT eval this Monday. Number provided for PT office for patient to call to reschedule which he is agreeable to do so. Patient will be scheduled for follow-up telephone visit.     Rachel Moulds, PA-C

## 2022-06-24 ENCOUNTER — Other Ambulatory Visit: Payer: Self-pay

## 2022-06-29 ENCOUNTER — Ambulatory Visit (INDEPENDENT_AMBULATORY_CARE_PROVIDER_SITE_OTHER)
Admission: RE | Admit: 2022-06-29 | Discharge: 2022-06-29 | Disposition: A | Discharge status: Still In Hospital | Payer: MEDICAID | Source: Ambulatory Visit | Attending: Family Medicine | Admitting: Family Medicine

## 2022-06-29 ENCOUNTER — Other Ambulatory Visit: Payer: Self-pay

## 2022-06-29 DIAGNOSIS — M47816 Spondylosis without myelopathy or radiculopathy, lumbar region: Secondary | ICD-10-CM | POA: Insufficient documentation

## 2022-06-29 DIAGNOSIS — M5416 Radiculopathy, lumbar region: Secondary | ICD-10-CM | POA: Insufficient documentation

## 2022-06-29 DIAGNOSIS — M2550 Pain in unspecified joint: Secondary | ICD-10-CM | POA: Insufficient documentation

## 2022-06-29 NOTE — PT Treatment (Signed)
[facility]   Outpatient Physical Therapy  [address]    [place], [place] [address]  Phone [phone]  FAX [phone]      OUTPATIENT PHYSICAL THERAPY DAILY NOTE  GENERAL INFORMATION  Patient Name: [name] [name]  Date of Birth: [birth year]  Diagnosis:  No diagnosis found.  Date of service: 06/29/22     Visit Tracker  Visit Number:  1  Visits Authorized:    Authorization End Date:    Treatment Plan Visits:  12  Medicare Certification/Re-certification End Date:      SUBJECTIVE  Patient reports some discomfort with exercise - encouraged to remain within pain free movement    OBJECTIVE  Therapeutic Exercise:  Lumbar trunk rotation   Posterior pelvic tilt   Single knee to chest difficulty R side secondary to R hip pain   Patient unable to tolerate double knee to chest at this time secondary to pain in R hip     ASSESSMENT  Patient tolerates well    PLAN  Patient will trial land based and aquatic PT next week. Work on more functional movement for spine - traction trial or modalities     MINUTES  Therapeutic Exercise 10   Manual Therapy    Neuromuscular Re-education    Gait    Manual Electrical Stimulation    Ultrasound    Total Timed Treatment Minutes 10   Total Treatment Time 10     Lorayne Marek, PT

## 2022-06-29 NOTE — PT Evaluation (Signed)
[facility] Medicine [facility]   Outpatient Physical Therapy  [address]    [place], [place] [address]  Phone [phone]  FAX [phone]      OUTPATIENT PHYSICAL THERAPY EVALUATION    GENERAL INFORMATION  Patient Name:[name] [name]  Date of Birth: [birth year]  Date of service: 06/29/2022     SUBJECTIVE  Patient accompanied by:  Patient   Chief complaint:  low back pain with radiating pain   Date of onset:  reports chronic - ~ 5 years go   Mechanism of injury:  reports work injury  Surgery related to current condition:  n/a  Diagnostic tests:  xray - MILD DEGENERATIVE CHANGES OF THE LUMBAR SPINE.   Previous treatment:  reports previous PT but   Ongoing treatment:  PT  Aggravating factors:  standing, walking, prolonged positioning   Relieving factors:  lying down various positioning, ibuprofen  Functional limitations:  standing, sleeping, walking "everything"   Prior level of function:  ambulatory without device   Additional Comments: physician would like to get updated MRI.   Medication:   Current Outpatient Medications   Medication Sig    meloxicam (MOBIC) 15 mg Oral Tablet Take 1 Tablet (15 mg total) by mouth Once a day     Past Medical History:    Past Medical History:   Diagnosis Date    COPD (chronic obstructive pulmonary disease) (CMS HCC)     Gout     Kidney stones     Low back pain          Past Surgical History:    Past Surgical History:   Procedure Laterality Date    HX FOOT SURGERY Left     jont implant left toe    HX HERNIA REPAIR      LITHOTRIPSY           Modality Precautions:  none  Living Environment:    Employment:  working in Photographer Activities:    Patient's Goals:  decrease pain, improve mobility  OBJECTIVE  Selective Functional Movement Assessment:  Cervical Patterns  Cervical Flexion:  Dysfunctional, non-painful  Cervical Extension:  Dysfunctional, non-painful  Cervical Rotation Right:  Dysfunctional, non-painful  Cervical Rotation Left:  Dysfunctional,  non-painful  Shoulder Patterns   Right Shoulder Impingement Test: Negative   Left Shoulder Impingement Test:  Negative  Right Shoulder Pattern 1:  Dysfunctional, non-painful  Left Shoulder Pattern 1:  Dysfunctional, non-painful  Right Shoulder Pattern 2:  Dysfunctional, non-painful  Left shoulder Pattern 2:  Dysfunctional, non-painful  Multisegmental Patterns  Multisegmental Flexion:  Dysfunctional, non-painful  Multisegmental Extension:  Dysfunctional, painful   Multisegmental Rotation Right:  Dysfunctional, painful  Multisegmental Rotation Left:  Dysfunctional, painful  Single Leg Stance  Single Leg Stance Right:  Dysfunctional, non-painful  Single Leg Stance Left:  Dysfunctional, non-painful  Deep Squat   Deep Squat: Dysfunctional, painful    Lumbar Spine Exam    Posture: anterior pelvic tilt with increased lumbar lordosis     ROM:             AROM PROM         Movement  (Degrees)    (Degrees)  End-Feel  Flexion 30      Extension 10     Right Rotation 20     Left Rotation 20     Right Side Bending      Left Side Bending  Special Tests:  + FABER R>L for pain and mobility; + active/passive straight leg raise on RLE; generalized hamstring tightness; + slump on R     Palpation:  bilateral SI and pelvic crest     Additional Comments:  patient has typical lower quarter screen with hip strength grossly 4/5 - painful with MMT at hip throughout.  Patient has limited hip IR/ER R>L.  Patient does note some improvement in symptoms with manual lumbar traction        Outcome measures:  Patient specific functional scale  Walking 6  ASSESSMENT  Evaluation Statement:  Patient presents with c/o low back with radiating pain into bilateral LE.  Patient presents with the following impairments: pain, decreased AROM of lumbar spine and hip, decreased strength, dysfunctional movement patterns, lack of knowledge therapeutic exercise for diagnosis.  Patient would benefit from further PT intervention to address the above  impairments.    Physician Diagnosis:  No diagnosis found.  Physical Therapist Diagnosis:   Low back pain with radiating pain - M54.16 and Low back mobility impairments - M53.86  Rehabilitation Potential:  Good  Problems and Goals:       Initial Problems/Status             Current Status                     Goals  Pain 2-7/10        Decrease 1-4/10   Decreased lumbar and hip AROM        Increase by at least 10 degrees in extension and hip rotations    Decreased strength        Increase hip strength 5/5   DP/DN multisegmental flexion/extension/rotation        FN/DN patterns                Evaluation Complexity:   Personal Factors Impacting Plan of Care:  none   Co-morbidities Impacting Plan of Care:  none   Body Structures Examined:  back   Body Functions Examined:  musculoskeletal function and neuromuscular function   Activity Limitations and Participation Restrictions Examined:  mobility, domestic life, and major life areas   Clinical Presentation:  Stable   Evaluation Complexity:  Low-history 0, examination 1-2, stable presentation    PLAN:  Consent for treatment provided by:  Patient  Frequency per week:  2  Duration weeks:  6  Modalities:  Moist Heat (97010), Electrical stimulation unattended (02585), and  Traction (27782)   Therapeutic interventions:  dry needling (42353 and 20561), joint mobilization (97140), soft tissue mobilization (97140), strengthening exercises (97110), ROM exercises (97110), stretching exercises (97110), stability and motor control exercises  (61443), and aquatic therapy (15400)    MINUTES  Evaluation minutes:  20     Start of Care Date:   06/29/2022.     Physician Name:  Cecilie Kicks, DO     Physician Signature___________________________________Date___________________    Lorayne Marek, PT

## 2022-07-04 ENCOUNTER — Other Ambulatory Visit: Payer: Self-pay

## 2022-07-04 ENCOUNTER — Ambulatory Visit
Admission: RE | Admit: 2022-07-04 | Discharge: 2022-07-04 | Disposition: A | Discharge status: Still In Hospital | Payer: MEDICAID | Source: Ambulatory Visit | Attending: Family Medicine | Admitting: Family Medicine

## 2022-07-04 NOTE — PT Treatment (Signed)
[facility] Medicine [facility]   Outpatient Physical Therapy  [address]    [place], [place] [address]  Phone [phone]  FAX [phone]      OUTPATIENT PHYSICAL THERAPY DAILY NOTE  GENERAL INFORMATION  Patient Name: [name] [name]  Date of Birth: [birth year]  Diagnosis:  No diagnosis found.  Date of service: 07/04/22     Visit Tracker  Visit Number:  2  Visits Authorized:    Authorization End Date:    Treatment Plan Visits:  12  Medicare Certification/Re-certification End Date:      SUBJECTIVE  Patient reports some discomfort with exercise - will trial pool on Friday     OBJECTIVE  Therapeutic Exercise:  Lumbar trunk rotation   Posterior pelvic tilt   Single knee to chest L  Piriformis stretch L     Moist Heat:  Minutes:  20  Location:  low back  Interferential Electrical Stimulation:  Minutes:  20  Location:  low back       ASSESSMENT  Patient tolerates well. Attempted quadruped rock back but had increased radicular pain and exercise was discontinued     PLAN  Patient will trial aquatic PT Friday. Continue per plan of care     MINUTES  Therapeutic Exercise 11   Manual Therapy    Hot Pack/IFES 20   Gait    Manual Electrical Stimulation    Ultrasound    Total Timed Treatment Minutes 11   Total Treatment Time 31     Lorayne Marek, PT

## 2022-07-06 ENCOUNTER — Ambulatory Visit (INDEPENDENT_AMBULATORY_CARE_PROVIDER_SITE_OTHER): Payer: Self-pay

## 2022-07-10 ENCOUNTER — Encounter (INDEPENDENT_AMBULATORY_CARE_PROVIDER_SITE_OTHER): Payer: Self-pay | Admitting: Family Medicine

## 2022-07-13 ENCOUNTER — Ambulatory Visit (INDEPENDENT_AMBULATORY_CARE_PROVIDER_SITE_OTHER): Payer: MEDICAID | Admitting: Anesthesiology

## 2022-08-02 ENCOUNTER — Other Ambulatory Visit: Payer: Self-pay

## 2022-08-02 ENCOUNTER — Encounter (INDEPENDENT_AMBULATORY_CARE_PROVIDER_SITE_OTHER): Payer: Self-pay | Admitting: PHYSICIAN ASSISTANT

## 2022-08-02 ENCOUNTER — Telehealth: Payer: MEDICAID | Admitting: PHYSICIAN ASSISTANT

## 2022-08-02 DIAGNOSIS — M545 Low back pain, unspecified: Secondary | ICD-10-CM

## 2022-08-02 DIAGNOSIS — M5416 Radiculopathy, lumbar region: Secondary | ICD-10-CM

## 2022-08-02 DIAGNOSIS — M47816 Spondylosis without myelopathy or radiculopathy, lumbar region: Secondary | ICD-10-CM

## 2022-08-02 NOTE — Progress Notes (Signed)
NEUROSURGERY, [place] [address]  [place] [address]  [facility]  Telephone Visit    Name:  [name] [name] MRN: [id number]   Date:  08/02/2022 Age:   56 y.o.     The patient/family initiated a request for telephone service.  Verbal consent for this service was obtained from the patient/family.    Last office visit in this department: 06/12/2022      Reason for call: follow-up PT  Call notes:  [name] is a 56 year-old male who was agreeable to telephone visit to follow-up from PT. Patient reports low back pain with radiation into bilateral posterolateral lower extremities, L>R-sided. Patient reports that his left achilles tendon "burns." He states these symptoms have been ongoing for 4-5 years since he hurt his back at work while moving a door. 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 He states pain is worse with all activity. He has undergone 2 PT sessions since his last visit. He states he had to discontinue PT due to personal reasons, but is interested in starting again.  He has not been to Pain management. He has tried oral steroids and NSAIDs without significant relief. He denies saddle anesthesia and bowel/bladder changes. No history of spine surgery. He had lumbar xrays completed at [facility]    1. Lumbar spondylosis  M47.816 Referral to [facility]      2. Lumbar back pain  M54.50 Referral to PHYSICAL THERAPY - [facility]      3. Lumbar radicular pain  M54.16 Referral to PHYSICAL THERAPY - [facility]        A/P:   Referral placed to [facility] PT.   Patient states he will call office upon PT completion to discuss obtaining additional imaging at this time     Total provider time spent with the patient on the phone: 5 minutes.    Clearnce Hasten, PA-C

## 2022-08-15 ENCOUNTER — Ambulatory Visit (INDEPENDENT_AMBULATORY_CARE_PROVIDER_SITE_OTHER): Payer: MEDICAID | Admitting: PHYSICAL THERAPY

## 2022-12-05 ENCOUNTER — Encounter (INDEPENDENT_AMBULATORY_CARE_PROVIDER_SITE_OTHER): Payer: Self-pay | Admitting: Family Medicine

## 2022-12-05 ENCOUNTER — Other Ambulatory Visit: Payer: MEDICAID | Attending: Family Medicine | Admitting: Family Medicine

## 2022-12-05 ENCOUNTER — Ambulatory Visit (INDEPENDENT_AMBULATORY_CARE_PROVIDER_SITE_OTHER): Payer: MEDICAID | Admitting: Family Medicine

## 2022-12-05 ENCOUNTER — Other Ambulatory Visit (INDEPENDENT_AMBULATORY_CARE_PROVIDER_SITE_OTHER): Payer: MEDICAID

## 2022-12-05 ENCOUNTER — Ambulatory Visit (INDEPENDENT_AMBULATORY_CARE_PROVIDER_SITE_OTHER): Payer: MEDICAID

## 2022-12-05 ENCOUNTER — Other Ambulatory Visit: Payer: Self-pay

## 2022-12-05 VITALS — BP 132/80 | HR 81 | Temp 97.8°F | Ht 74.0 in | Wt 284.4 lb

## 2022-12-05 DIAGNOSIS — M1990 Unspecified osteoarthritis, unspecified site: Secondary | ICD-10-CM

## 2022-12-05 DIAGNOSIS — G5600 Carpal tunnel syndrome, unspecified upper limb: Secondary | ICD-10-CM

## 2022-12-05 DIAGNOSIS — R200 Anesthesia of skin: Secondary | ICD-10-CM

## 2022-12-05 DIAGNOSIS — F1011 Alcohol abuse, in remission: Secondary | ICD-10-CM

## 2022-12-05 DIAGNOSIS — F17210 Nicotine dependence, cigarettes, uncomplicated: Secondary | ICD-10-CM

## 2022-12-05 DIAGNOSIS — R5383 Other fatigue: Secondary | ICD-10-CM

## 2022-12-05 DIAGNOSIS — M5412 Radiculopathy, cervical region: Secondary | ICD-10-CM

## 2022-12-05 DIAGNOSIS — F17200 Nicotine dependence, unspecified, uncomplicated: Secondary | ICD-10-CM

## 2022-12-05 LAB — THYROID STIMULATING HORMONE (SENSITIVE TSH): TSH: 1.523 u[IU]/mL (ref 0.350–4.940)

## 2022-12-05 LAB — CBC WITH DIFF
BASOPHIL #: 0 10*3/uL (ref 0.00–0.30)
BASOPHIL %: 1 % (ref 0–1)
EOSINOPHIL #: 0.3 10*3/uL (ref 0.00–0.50)
EOSINOPHIL %: 5 % — ABNORMAL HIGH (ref 0–4)
HCT: 49.5 % (ref 42.0–52.0)
HGB: 17 g/dL (ref 13.5–18.0)
LYMPHOCYTE #: 2.3 10*3/uL (ref 0.90–4.80)
LYMPHOCYTE %: 33 % (ref 23–35)
MCH: 34.3 pg — ABNORMAL HIGH (ref 28.0–33.0)
MCHC: 34.3 g/dL (ref 32.0–37.0)
MCV: 100.2 fL — ABNORMAL HIGH (ref 78.0–100.0)
MONOCYTE #: 0.8 10*3/uL (ref 0.30–0.90)
MONOCYTE %: 12 % (ref 0–12)
NEUTROPHIL #: 3.6 10*3/uL (ref 1.70–7.00)
NEUTROPHIL %: 50 % (ref 50–70)
PLATELETS: 229 10*3/uL (ref 130–400)
RBC: 4.94 10*6/uL (ref 4.70–5.40)
RDW: 13.5 % (ref 9.9–16.5)
WBC: 7.1 10*3/uL (ref 4.5–11.0)

## 2022-12-05 LAB — COMPREHENSIVE METABOLIC PNL, FASTING
ALBUMIN: 3.8 g/dL (ref 3.5–5.0)
ALKALINE PHOSPHATASE: 68 U/L (ref 45–115)
ALT (SGPT): 27 U/L (ref 10–55)
ANION GAP: 8 mmol/L (ref 4–13)
AST (SGOT): 27 U/L (ref 8–45)
BILIRUBIN TOTAL: 0.7 mg/dL (ref 0.3–1.3)
BUN/CREA RATIO: 13 (ref 6–22)
BUN: 13 mg/dL (ref 8–25)
CALCIUM: 9.6 mg/dL (ref 8.6–10.2)
CHLORIDE: 101 mmol/L (ref 96–111)
CO2 TOTAL: 29 mmol/L (ref 22–30)
CREATININE: 1.02 mg/dL (ref 0.75–1.35)
ESTIMATED GFR - MALE: 86 mL/min/BSA (ref >=60)
GLUCOSE: 87 mg/dL (ref 70–99)
POTASSIUM: 4.9 mmol/L (ref 3.5–5.1)
PROTEIN TOTAL: 7.5 g/dL (ref 6.4–8.3)
SODIUM: 138 mmol/L (ref 136–145)

## 2022-12-05 LAB — LIPID PANEL
CHOL/HDL RATIO: 5
CHOLESTEROL: 210 mg/dL — ABNORMAL HIGH (ref 100–200)
HDL CHOL: 42 mg/dL — ABNORMAL LOW (ref >=50)
LDL CALC: 131 mg/dL — ABNORMAL HIGH (ref <100)
NON-HDL: 168 mg/dL (ref <=190)
TRIGLYCERIDES: 206 mg/dL — ABNORMAL HIGH (ref <150)
VLDL CALC: 37 mg/dL — ABNORMAL HIGH (ref <30)

## 2022-12-05 LAB — URINALYSIS, MACROSCOPIC
BILIRUBIN: NEGATIVE mg/dL
BLOOD: NEGATIVE mg/dL
GLUCOSE: NEGATIVE mg/dL
LEUKOCYTES: NEGATIVE WBCs/uL
NITRITE: NEGATIVE
PH: 6 (ref 5.0–8.0)
PROTEIN: 50 mg/dL — AB
SPECIFIC GRAVITY: 1.03 (ref 1.002–1.030)
UROBILINOGEN: 3 mg/dL — AB (ref <2.0)

## 2022-12-05 LAB — URIC ACID: URIC ACID: 8.6 mg/dL — ABNORMAL HIGH (ref 3.5–7.5)

## 2022-12-05 LAB — URINALYSIS, MICROSCOPIC

## 2022-12-05 LAB — THYROXINE, FREE (FREE T4): THYROXINE (T4), FREE: 0.95 ng/dL (ref 0.70–1.48)

## 2022-12-05 NOTE — Progress Notes (Signed)
FAMILY MEDICINE, [facility]  [address] [place] [address]       Name: [name] [name]  MRN:  [id number]       Date: 12/05/2022  DOB:    21-May-1966  Age: 57 y.o.         Reason for Visit: Numbness (Bilateral hands up arm numbness, left for numbness)    History of Present Illness  [name] [name] is a 57 y.o. male who is being seen today for arm and hand numbness off and on times a last several months worse lately keeps him up at night.  No specific trauma.  Work with his hands all his life.  History of low back pain.  Request up-to-date labs.  Denies eyesight changes denies headaches denies significant weakness patient has fatigue would like check for Lyme disease also.  Denies chest pain denies shortness of breath.       Past Medical History:   Diagnosis Date    COPD (chronic obstructive pulmonary disease) (CMS HCC)     Gout     Kidney stones     Low back pain          Past Surgical History:   Procedure Laterality Date    HX FOOT SURGERY Left     jont implant left toe    HX HERNIA REPAIR      LITHOTRIPSY           Current Outpatient Medications   Medication Sig    meloxicam (MOBIC) 15 mg Oral Tablet Take 1 Tablet (15 mg total) by mouth Once a day (Patient not taking: Reported on 12/05/2022)     Allergies   Allergen Reactions    Naproxen Nausea/ Vomiting     Family Medical History:    None         Social History     Tobacco Use    Smoking status: Every Day     Packs/day: 1     Types: Cigarettes    Smokeless tobacco: Never   Vaping Use    Vaping Use: Never used   Substance Use Topics    Alcohol use: Yes     Comment: occ    Drug use: Never       Nursing note  There are no exam notes on file for this visit.     Review of Systems  Review of Systems   Constitutional:  Positive for malaise/fatigue.   HENT: Negative.     Eyes: Negative.    Respiratory: Negative.     Cardiovascular: Negative.    Gastrointestinal: Negative.    Genitourinary: Negative.    Musculoskeletal:  Positive for back  pain, joint pain, myalgias and neck pain.   Skin: Negative.    Neurological:  Positive for tingling.   Endo/Heme/Allergies: Negative.    Psychiatric/Behavioral: Negative.         Physical Exam:  BP 132/80 (Site: Right, Patient Position: Sitting, Cuff Size: Adult)   Pulse 81   Temp 36.6 C (97.8 F) (Oral)   Ht 1.88 m (6\' 2" )   Wt 129 kg (284 lb 6.4 oz)   SpO2 96%   BMI 36.51 kg/m       Physical Exam  HENT:      Head: Normocephalic.   Eyes:      Conjunctiva/sclera: Conjunctivae normal.      Pupils: Pupils are equal, round, and reactive to light.   Cardiovascular:  Rate and Rhythm: Normal rate and regular rhythm.      Heart sounds: Normal heart sounds.   Pulmonary:      Effort: Pulmonary effort is normal.      Breath sounds: Normal breath sounds.   Abdominal:      General: Bowel sounds are normal.      Palpations: Abdomen is soft.   Musculoskeletal:         General: Tenderness present.      Cervical back: Normal range of motion and neck supple.      Comments: Decreased range of motion cervical area in flexion extension rotation side bending some crepitus noted C2 through C5.  Good grip strength bilateral questionable Tinel sign questionable Phalen sign good distal neurosensory.    Skin:     General: Skin is warm.   Neurological:      Mental Status: He is alert and oriented to person, place, and time.      Gait: Gait is intact.   Psychiatric:         Mood and Affect: Mood and affect normal.         Cognition and Memory: Memory normal.         Judgment: Judgment normal.         Assessment and Plan    ENCOUNTER DIAGNOSES     ICD-10-CM   1. Fatigue, unspecified type  R53.83   2. Carpal tunnel syndrome, unspecified laterality  G56.00   3. Cervical radiculopathy  M54.12   4. Arthritis  M19.90   5. Numbness  R20.0   6. Morbid obesity (CMS HCC)  E66.01   7. H/O ETOH abuse  F10.11   8. Tobacco dependence  F17.200        Orders Placed This Encounter    Cervical Spine Series Xray    CBC/DIFF    COMPREHENSIVE METABOLIC  PNL, FASTING    LIPID PANEL    URINALYSIS, MACROSCOPIC AND MICROSCOPIC    Uric Acid    TSH Sensitive    Thyroxine Free (Free T4)    LYME ANTIBODY PANEL WITH REFLEX    TESTOSTERONE, TOTAL, BIOAVAILABLE, AND FREE WITH SHBG, SERUM    NCS/EMG    Tell patient he gained some weight he needs to watch his diet exercise on regular basis will do some up-to-date outpatient labs do Lyme titer testosterone level also do cervical spinal x-ray along with will do an EMG upper extremities.  Told him to get some splints over-the-counter for his wrist to see if that will help his numbness his hands.  Continue anti-inflammatory.  If no better within the next few weeks we will get an MRI possible physical therapy get him in neurologist also possibly.  If any other problems in the meantime he is to let us know answered all questions for now we will know more after workup is done.  Told him to continue abstain from alcohol counseled patient to quit smoking also.On the day of the encounter, a total of  30 minutes was spent on this patient encounter including review of historical information, examination, documentation and post-visit activities. The time documented excludes procedural time.     Follow up: Return in about 3 months (around 03/05/2023).    Londell Moh, DO  12/05/2022, 07:39

## 2022-12-06 LAB — LYME ANTIBODY PANEL WITH REFLEX: LYME ANTIBODY TOTAL (Screen): NEGATIVE

## 2022-12-09 LAB — TESTOSTERONE, TOTAL, BIOAVAILABLE, AND FREE WITH SHBG, SERUM
ALBUMIN: 4.4 g/dL (ref 3.6–5.1)
SEX HORMONE BINDING GLOB.: 73 nmol/L (ref 22–77)
TESTOSTERONE, BIOAVAILABLE: 57.4 ng/dL — ABNORMAL LOW (ref 110.0–575.0)
TESTOSTERONE, FREE: 28.5 pg/mL — ABNORMAL LOW (ref 46.0–224.0)
TESTOSTERONE,TOTAL,LC/MS/MS: 436 ng/dL (ref 250–1100)

## 2022-12-12 ENCOUNTER — Inpatient Hospital Stay (HOSPITAL_COMMUNITY)
Admission: RE | Admit: 2022-12-12 | Discharge: 2022-12-12 | Disposition: A | Discharge status: Home / Self Care | Payer: MEDICAID | Source: Ambulatory Visit

## 2022-12-12 ENCOUNTER — Other Ambulatory Visit (HOSPITAL_COMMUNITY): Payer: Self-pay | Admitting: Family Medicine

## 2022-12-12 ENCOUNTER — Inpatient Hospital Stay
Admission: RE | Admit: 2022-12-12 | Discharge: 2022-12-12 | Disposition: A | Discharge status: Home / Self Care | Payer: MEDICAID | Source: Ambulatory Visit | Attending: Family Medicine | Admitting: Family Medicine

## 2022-12-12 ENCOUNTER — Other Ambulatory Visit: Payer: Self-pay

## 2022-12-12 DIAGNOSIS — M5412 Radiculopathy, cervical region: Secondary | ICD-10-CM | POA: Insufficient documentation

## 2022-12-12 DIAGNOSIS — M1990 Unspecified osteoarthritis, unspecified site: Secondary | ICD-10-CM

## 2022-12-12 DIAGNOSIS — R200 Anesthesia of skin: Secondary | ICD-10-CM

## 2022-12-12 DIAGNOSIS — G5600 Carpal tunnel syndrome, unspecified upper limb: Secondary | ICD-10-CM | POA: Insufficient documentation

## 2022-12-19 ENCOUNTER — Telehealth (INDEPENDENT_AMBULATORY_CARE_PROVIDER_SITE_OTHER): Payer: Self-pay | Admitting: Family Medicine

## 2022-12-19 NOTE — Telephone Encounter (Signed)
He did not complete PT. He only went to a couple appt for PT. Scheduled appt for office visit for March 6. 12/19/22 sms

## 2023-01-09 ENCOUNTER — Encounter (INDEPENDENT_AMBULATORY_CARE_PROVIDER_SITE_OTHER): Payer: Self-pay | Admitting: Family Medicine

## 2023-01-09 ENCOUNTER — Other Ambulatory Visit: Payer: Self-pay

## 2023-01-09 ENCOUNTER — Telehealth (INDEPENDENT_AMBULATORY_CARE_PROVIDER_SITE_OTHER): Payer: Self-pay | Admitting: Family Medicine

## 2023-01-09 ENCOUNTER — Ambulatory Visit: Payer: MEDICAID | Attending: Family Medicine

## 2023-01-09 ENCOUNTER — Ambulatory Visit (INDEPENDENT_AMBULATORY_CARE_PROVIDER_SITE_OTHER): Payer: MEDICAID | Admitting: Family Medicine

## 2023-01-09 ENCOUNTER — Ambulatory Visit (INDEPENDENT_AMBULATORY_CARE_PROVIDER_SITE_OTHER): Payer: MEDICAID

## 2023-01-09 VITALS — BP 138/78 | HR 100 | Temp 97.9°F | Resp 12 | Ht 74.0 in | Wt 271.8 lb

## 2023-01-09 DIAGNOSIS — M4802 Spinal stenosis, cervical region: Secondary | ICD-10-CM

## 2023-01-09 DIAGNOSIS — F17210 Nicotine dependence, cigarettes, uncomplicated: Secondary | ICD-10-CM

## 2023-01-09 DIAGNOSIS — R0602 Shortness of breath: Secondary | ICD-10-CM

## 2023-01-09 DIAGNOSIS — M109 Gout, unspecified: Secondary | ICD-10-CM

## 2023-01-09 DIAGNOSIS — R609 Edema, unspecified: Secondary | ICD-10-CM | POA: Insufficient documentation

## 2023-01-09 DIAGNOSIS — F17200 Nicotine dependence, unspecified, uncomplicated: Secondary | ICD-10-CM

## 2023-01-09 DIAGNOSIS — R262 Difficulty in walking, not elsewhere classified: Secondary | ICD-10-CM | POA: Insufficient documentation

## 2023-01-09 DIAGNOSIS — M5416 Radiculopathy, lumbar region: Secondary | ICD-10-CM

## 2023-01-09 DIAGNOSIS — R531 Weakness: Secondary | ICD-10-CM

## 2023-01-09 DIAGNOSIS — F1010 Alcohol abuse, uncomplicated: Secondary | ICD-10-CM

## 2023-01-09 DIAGNOSIS — J449 Chronic obstructive pulmonary disease, unspecified: Secondary | ICD-10-CM | POA: Insufficient documentation

## 2023-01-09 LAB — ECG 12 LEAD - (AMB USE ONLY)(MUSE, IN CLINIC)
Atrial Rate: 81 {beats}/min
Calculated P Axis: 63 degrees
Calculated R Axis: 48 degrees
Calculated T Axis: 48 degrees
PR Interval: 168 ms
QRS Duration: 92 ms
QT Interval: 378 ms
QTC Calculation: 439 ms
Ventricular rate: 81 {beats}/min

## 2023-01-09 MED ORDER — ALBUTEROL SULFATE HFA 90 MCG/ACTUATION AEROSOL INHALER
1.0000 | INHALATION_SPRAY | Freq: Four times a day (QID) | RESPIRATORY_TRACT | 3 refills | Status: DC | PRN
Start: 2023-01-09 — End: 2023-02-20

## 2023-01-09 MED ORDER — DEXAMETHASONE SODIUM PHOSPHATE 4 MG/ML INJECTION SOLUTION
4.0000 mg | INTRAMUSCULAR | Status: AC
Start: 2023-01-09 — End: 2023-01-09
  Administered 2023-01-09: 4 mg via INTRAMUSCULAR

## 2023-01-09 MED ORDER — PREDNISONE 20 MG TABLET
ORAL_TABLET | ORAL | 1 refills | Status: DC
Start: 2023-01-09 — End: 2023-02-14

## 2023-01-09 MED ORDER — ALLOPURINOL 100 MG TABLET
100.0000 mg | ORAL_TABLET | Freq: Two times a day (BID) | ORAL | 1 refills | Status: DC
Start: 2023-01-09 — End: 2023-02-18

## 2023-01-09 MED ORDER — KETOROLAC 60 MG/2 ML INTRAMUSCULAR SOLUTION
60.0000 mg | Freq: Once | INTRAMUSCULAR | Status: AC
Start: 2023-01-09 — End: 2023-01-09
  Administered 2023-01-09: 60 mg via INTRAMUSCULAR

## 2023-01-09 NOTE — Progress Notes (Signed)
FAMILY MEDICINE, [facility]  [address] [place] [address]       Name: SUYASH AMORY  MRN:  [id number]       Date: 01/09/2023  DOB:    [birth year]  Age: 57 y.o.         Reason for Visit: Follow Up (F/u EMG and x ray/discuss inability to walk)    History of Present Illness  CALLEN VANCUREN is a 57 y.o. male who is being seen today for several factors discuss EMG still weakness upper extremities poor grip strength patient states he has having a hard time walking also.  Still smoking.  Denies chest pain but has a edema noted distally says some shortness of breath on exertion no fever no chills no cough appetite is good no significant loss of weight.  No bowel changes bladder changes.  Gout flare up from drinking alcohol per patient request shots and some medication      Past Medical History:   Diagnosis Date    COPD (chronic obstructive pulmonary disease) (CMS HCC)     Gout     Kidney stones     Low back pain          Past Surgical History:   Procedure Laterality Date    HX FOOT SURGERY Left     jont implant left toe    HX HERNIA REPAIR      LITHOTRIPSY           Current Outpatient Medications   Medication Sig    albuterol sulfate (PROVENTIL OR VENTOLIN OR PROAIR) 90 mcg/actuation Inhalation oral inhaler Take 1-2 Puffs by inhalation Every 6 hours as needed    allopurinoL (ZYLOPRIM) 100 mg Oral Tablet Take 1 Tablet (100 mg total) by mouth Twice daily    meloxicam (MOBIC) 15 mg Oral Tablet Take 1 Tablet (15 mg total) by mouth Once a day (Patient not taking: Reported on 12/05/2022)    predniSONE (DELTASONE) 20 mg Oral Tablet 4-4-3-3-2-2-1-1     Allergies   Allergen Reactions    Naproxen Nausea/ Vomiting     Family Medical History:    None         Social History     Tobacco Use    Smoking status: Every Day     Current packs/day: 1.00     Types: Cigarettes    Smokeless tobacco: Never   Vaping Use    Vaping status: Never Used   Substance Use Topics    Alcohol use: Yes     Comment: occ    Drug  use: Never       Nursing note  There are no exam notes on file for this visit.     Review of Systems  Review of Systems   Constitutional:  Positive for malaise/fatigue.   HENT: Negative.     Eyes: Negative.    Respiratory:  Positive for cough, sputum production and shortness of breath.    Cardiovascular:  Positive for leg swelling.   Gastrointestinal: Negative.    Genitourinary: Negative.    Musculoskeletal:  Positive for back pain, joint pain, myalgias and neck pain.   Skin: Negative.    Neurological:  Positive for tingling and weakness.   Endo/Heme/Allergies: Negative.    Psychiatric/Behavioral:  The patient is nervous/anxious.        Physical Exam:  BP 138/78 (Site: Left, Patient Position: Sitting, Cuff Size: Adult)   Pulse 100   Temp 36.6 C (  97.9 F)   Resp 12   Ht 1.88 m (6\' 2" )   Wt 123 kg (271 lb 12.8 oz)   SpO2 (!) 86%   BMI 34.90 kg/m       Physical Exam  Constitutional:       Appearance: Normal appearance.   HENT:      Head: Normocephalic.   Eyes:      Conjunctiva/sclera: Conjunctivae normal.      Pupils: Pupils are equal, round, and reactive to light.   Cardiovascular:      Rate and Rhythm: Normal rate and regular rhythm.      Heart sounds: Normal heart sounds.   Pulmonary:      Effort: Pulmonary effort is normal.      Breath sounds: Normal breath sounds.   Abdominal:      General: Bowel sounds are normal.      Palpations: Abdomen is soft.   Musculoskeletal:         General: Tenderness present.      Cervical back: Normal range of motion and neck supple.      Comments: Some tenderness to palpation number sicker with decreased range of motion in flexion extension rotation side bending deep tendon reflexes intact   Skin:     General: Skin is warm.   Neurological:      Mental Status: He is alert and oriented to person, place, and time.      Gait: Gait is intact.      Comments: Decreased grip strength bilateral   Psychiatric:         Mood and Affect: Mood and affect normal.         Cognition and  Memory: Memory normal.         Judgment: Judgment normal.      Comments: anxious         Assessment and Plan    ENCOUNTER DIAGNOSES     ICD-10-CM   1. SOB (shortness of breath)  R06.02   2. Edema, unspecified type  R60.9   3. Stage 2 moderate COPD by GOLD classification (CMS HCC)  J44.9   4. Impaired ambulation  R26.2   5. Cervical stenosis of spinal canal  M48.02   6. Lumbar radiculopathy  M54.16   7. Tobacco dependence  F17.200   8. Weakness  R53.1   9. Gout, unspecified cause, unspecified chronicity, unspecified site  M10.9        Orders Placed This Encounter    CXR PA & Lat    CT Brain WO    Referral to External Provider (AMB)    ECG 12 LEAD (In Clinic - Patient will Return)    TRANSTHORACIC ECHOCARDIOGRAM - ADULT    dexAMETHasone 4 mg/mL injection    ketorolac (TORADOL) 60mg /2 mL IM injection    predniSONE (DELTASONE) 20 mg Oral Tablet    albuterol sulfate (PROVENTIL OR VENTOLIN OR PROAIR) 90 mcg/actuation Inhalation oral inhaler    allopurinoL (ZYLOPRIM) 100 mg Oral Tablet    Will do workup including chest x-ray CT brain patient already had x-rays of neck and lumbar sacral area recently will do an EKG do a echocardiogram give shot of dexamethasone and Toradol for gout flare up albuterol inhaler medication for gout to take by mouth go to ER if becomes worse I let him know monitor closely counseled patient quit smoking answered all questions for now.     Follow up: Return in about 4 weeks (around 02/06/2023).    [employer],  DO  01/09/2023, 12:25

## 2023-01-09 NOTE — Telephone Encounter (Signed)
01/09/23 pc spoke with [name] [name] gave appt date and time for TRANSTHORACIC ECHOCARDIOGRAM - wed 01/23/23 at 8am arrive at 7:30 am [facility]

## 2023-01-09 NOTE — Progress Notes (Signed)
Administrations This Visit       dexAMETHasone 4 mg/mL injection       Admin Date  01/09/2023 Action  Given Dose  4 mg Route  IntraMUSCULAR Documented By  Georgetta Haber, Ambulatory Care Assistant              ketorolac (TORADOL) 60mg /2 mL IM injection       Admin Date  01/09/2023 Action  Given Dose  60 mg Route  IntraMUSCULAR Documented By  Georgetta Haber, Ambulatory Care Assistant

## 2023-01-10 NOTE — Addendum Note (Signed)
Addended by: Hendricks Limes on: 01/10/2023 11:43 AM     Modules accepted: Level of Service

## 2023-01-16 ENCOUNTER — Other Ambulatory Visit (INDEPENDENT_AMBULATORY_CARE_PROVIDER_SITE_OTHER): Payer: Self-pay | Admitting: Family Medicine

## 2023-01-16 ENCOUNTER — Telehealth (INDEPENDENT_AMBULATORY_CARE_PROVIDER_SITE_OTHER): Payer: Self-pay | Admitting: Family Medicine

## 2023-01-16 DIAGNOSIS — R200 Anesthesia of skin: Secondary | ICD-10-CM

## 2023-01-16 DIAGNOSIS — R202 Paresthesia of skin: Secondary | ICD-10-CM

## 2023-01-16 DIAGNOSIS — R262 Difficulty in walking, not elsewhere classified: Secondary | ICD-10-CM

## 2023-01-16 NOTE — Telephone Encounter (Signed)
01/16/23 pc spoke with [name] [name] gave appt date and time for MRI BRAIN WO CONTRAST fri 01/18/23 at 10:45 am arrive at 10:45 am [facility] , fax MRI screening form to [facility] scheduling [facility]

## 2023-01-18 ENCOUNTER — Other Ambulatory Visit: Payer: Self-pay

## 2023-01-18 ENCOUNTER — Inpatient Hospital Stay
Admission: RE | Admit: 2023-01-18 | Discharge: 2023-01-18 | Disposition: A | Discharge status: Home / Self Care | Payer: MEDICAID | Source: Ambulatory Visit | Attending: Family Medicine | Admitting: Family Medicine

## 2023-01-18 DIAGNOSIS — R200 Anesthesia of skin: Secondary | ICD-10-CM | POA: Insufficient documentation

## 2023-01-18 DIAGNOSIS — R202 Paresthesia of skin: Secondary | ICD-10-CM | POA: Insufficient documentation

## 2023-01-18 DIAGNOSIS — R262 Difficulty in walking, not elsewhere classified: Secondary | ICD-10-CM | POA: Insufficient documentation

## 2023-01-23 ENCOUNTER — Inpatient Hospital Stay
Admission: RE | Admit: 2023-01-23 | Discharge: 2023-01-23 | Disposition: A | Discharge status: Home / Self Care | Payer: MEDICAID | Source: Ambulatory Visit | Attending: Family Medicine | Admitting: Family Medicine

## 2023-01-23 ENCOUNTER — Other Ambulatory Visit: Payer: Self-pay

## 2023-01-23 DIAGNOSIS — R262 Difficulty in walking, not elsewhere classified: Secondary | ICD-10-CM

## 2023-01-23 DIAGNOSIS — I517 Cardiomegaly: Secondary | ICD-10-CM

## 2023-01-23 DIAGNOSIS — R609 Edema, unspecified: Secondary | ICD-10-CM | POA: Insufficient documentation

## 2023-01-23 DIAGNOSIS — M4802 Spinal stenosis, cervical region: Secondary | ICD-10-CM | POA: Insufficient documentation

## 2023-01-23 DIAGNOSIS — R931 Abnormal findings on diagnostic imaging of heart and coronary circulation: Secondary | ICD-10-CM

## 2023-01-23 DIAGNOSIS — M5416 Radiculopathy, lumbar region: Secondary | ICD-10-CM | POA: Insufficient documentation

## 2023-01-23 DIAGNOSIS — R0602 Shortness of breath: Secondary | ICD-10-CM | POA: Insufficient documentation

## 2023-01-23 DIAGNOSIS — I361 Nonrheumatic tricuspid (valve) insufficiency: Secondary | ICD-10-CM | POA: Insufficient documentation

## 2023-01-23 DIAGNOSIS — J449 Chronic obstructive pulmonary disease, unspecified: Secondary | ICD-10-CM | POA: Insufficient documentation

## 2023-01-23 DIAGNOSIS — F17200 Nicotine dependence, unspecified, uncomplicated: Secondary | ICD-10-CM | POA: Insufficient documentation

## 2023-01-29 ENCOUNTER — Telehealth (INDEPENDENT_AMBULATORY_CARE_PROVIDER_SITE_OTHER): Payer: Self-pay | Admitting: Family Medicine

## 2023-01-29 ENCOUNTER — Other Ambulatory Visit (INDEPENDENT_AMBULATORY_CARE_PROVIDER_SITE_OTHER): Payer: Self-pay | Admitting: Family Medicine

## 2023-01-29 DIAGNOSIS — R269 Unspecified abnormalities of gait and mobility: Secondary | ICD-10-CM

## 2023-01-29 NOTE — Telephone Encounter (Signed)
LVM 01/29/23 sms

## 2023-01-30 ENCOUNTER — Other Ambulatory Visit (INDEPENDENT_AMBULATORY_CARE_PROVIDER_SITE_OTHER): Payer: Self-pay | Admitting: Family Medicine

## 2023-01-30 DIAGNOSIS — R269 Unspecified abnormalities of gait and mobility: Secondary | ICD-10-CM

## 2023-02-01 NOTE — Telephone Encounter (Signed)
Patient has appt scheduled with neurology 03/12/23

## 2023-02-04 DIAGNOSIS — S81819A Laceration without foreign body, unspecified lower leg, initial encounter: Secondary | ICD-10-CM

## 2023-02-04 HISTORY — DX: Laceration without foreign body, unspecified lower leg, initial encounter: S81.819A

## 2023-02-14 ENCOUNTER — Emergency Department (HOSPITAL_COMMUNITY): Payer: MEDICAID

## 2023-02-14 ENCOUNTER — Encounter (HOSPITAL_COMMUNITY): Payer: Self-pay

## 2023-02-14 ENCOUNTER — Emergency Department
Admission: EM | Admit: 2023-02-14 | Discharge: 2023-02-15 | Discharge status: Against Medical Advice | Payer: MEDICAID | Attending: Student in an Organized Health Care Education/Training Program | Admitting: Student in an Organized Health Care Education/Training Program

## 2023-02-14 ENCOUNTER — Other Ambulatory Visit: Payer: Self-pay

## 2023-02-14 DIAGNOSIS — R29818 Other symptoms and signs involving the nervous system: Secondary | ICD-10-CM | POA: Insufficient documentation

## 2023-02-14 DIAGNOSIS — R2990 Unspecified symptoms and signs involving the nervous system: Secondary | ICD-10-CM

## 2023-02-14 DIAGNOSIS — M4802 Spinal stenosis, cervical region: Secondary | ICD-10-CM

## 2023-02-14 DIAGNOSIS — Z5329 Procedure and treatment not carried out because of patient's decision for other reasons: Secondary | ICD-10-CM | POA: Insufficient documentation

## 2023-02-14 DIAGNOSIS — Z87442 Personal history of urinary calculi: Secondary | ICD-10-CM | POA: Insufficient documentation

## 2023-02-14 DIAGNOSIS — E878 Other disorders of electrolyte and fluid balance, not elsewhere classified: Secondary | ICD-10-CM

## 2023-02-14 LAB — CBC
HCT: 50.1 % (ref 42.0–52.0)
HGB: 16.8 g/dL (ref 13.5–18.0)
MCH: 31.8 pg (ref 28.0–33.0)
MCHC: 33.5 g/dL (ref 32.0–37.0)
MCV: 94.9 fL (ref 78.0–100.0)
MPV: 7.4 fL
PLATELETS: 254 10*3/uL (ref 130–400)
RBC: 5.29 10*6/uL (ref 4.70–5.40)
RDW: 14.1 % (ref 9.9–16.5)
WBC: 6.7 10*3/uL (ref 4.5–11.0)

## 2023-02-14 LAB — BASIC METABOLIC PANEL
ANION GAP: 9 mmol/L (ref 4–13)
BUN/CREA RATIO: 12 (ref 6–22)
BUN: 9 mg/dL (ref 8–25)
CALCIUM: 9.7 mg/dL (ref 8.6–10.2)
CHLORIDE: 101 mmol/L (ref 96–111)
CO2 TOTAL: 28 mmol/L (ref 22–30)
CREATININE: 0.73 mg/dL — ABNORMAL LOW (ref 0.75–1.35)
ESTIMATED GFR - MALE: >90 mL/min/BSA (ref >=60)
GLUCOSE: 118 mg/dL (ref 65–125)
POTASSIUM: 3.9 mmol/L (ref 3.5–5.1)
SODIUM: 138 mmol/L (ref 136–145)

## 2023-02-14 LAB — URINALYSIS, MICROSCOPIC

## 2023-02-14 LAB — URINALYSIS, MACROSCOPIC
BILIRUBIN: NEGATIVE mg/dL
BLOOD: NEGATIVE mg/dL
GLUCOSE: NEGATIVE mg/dL
KETONES: NEGATIVE mg/dL
LEUKOCYTES: NEGATIVE WBCs/uL
NITRITE: NEGATIVE
PH: 7 (ref 5.0–8.0)
PROTEIN: 30 mg/dL — AB
SPECIFIC GRAVITY: 1.01 (ref 1.002–1.030)
UROBILINOGEN: <2 mg/dL (ref <2.0)

## 2023-02-14 MED ORDER — LORAZEPAM 2 MG/ML INJECTION WRAPPER
2.0000 mg | INTRAMUSCULAR | Status: AC
Start: 2023-02-14 — End: 2023-02-14
  Administered 2023-02-14: 2 mg via INTRAVENOUS
  Filled 2023-02-14: qty 1

## 2023-02-14 MED ORDER — GADOTERIDOL 279.3 MG/ML INTRAVENOUS SOLUTION
20.0000 mL | INTRAVENOUS | Status: AC
Start: 2023-02-14 — End: 2023-02-14
  Administered 2023-02-14: 20 mL via INTRAVENOUS

## 2023-02-14 MED ORDER — LORAZEPAM 2 MG/ML INJECTION WRAPPER
0.5000 mg | INTRAMUSCULAR | Status: AC
Start: 2023-02-14 — End: 2023-02-14
  Administered 2023-02-14: 0.5 mg via INTRAVENOUS
  Filled 2023-02-14: qty 1

## 2023-02-14 MED ORDER — ALBUTEROL SULFATE 2.5 MG/3 ML (0.083 %) SOLUTION FOR NEBULIZATION
2.5000 mg | INHALATION_SOLUTION | RESPIRATORY_TRACT | Status: AC
Start: 2023-02-14 — End: 2023-02-14
  Administered 2023-02-14: 2.5 mg via RESPIRATORY_TRACT
  Filled 2023-02-14: qty 3

## 2023-02-14 NOTE — ED Nurses Note (Addendum)
[place] signed. ETA Transport approximately 1 hour

## 2023-02-14 NOTE — ED Provider Notes (Addendum)
Emergency Department  Provider Note    Name: [name] [name]      Subjective  History of Present Illness    [name] [name] is a 57 y.o. male  who presents to the ED today for neurologic symptoms  Patient is a 57 year old male with prior history of low back pain, COPD, kidney stones.  For the past several months, patient has been experiencing intermittent abnormal neurologic sensations and paresthesias, patient was evaluated on 03/15, with MRI questioning possible demyelination however noncontrasted study.  Patient has yet to establish with Neurology, and states that over the past 3-4 days she has been experiencing progressive paresthesias in the distal tip of his fingers, as well as weakness in his lower extremities, with sensation of saddle anesthesia, patient was educated on red flag neurologic symptoms, for which he has been experiencing worsening symptoms over the past several days, patient's wife who was present at bedside reports patient additionally has been experiencing increased redness of his eyes however patient denies any vision changes, blurred vision, discomfort with extraocular muscle movement.  Patient denies any falls or trauma, patient reports that he was previously on steroids, in his currently not on a steroid taper.  Patient reports that he has not received LP to confirm suspected demyelinating disorder.  Patient denies any falls or trauma, based off symptomatology, patient presents to the emergency department, patient states that over the past several hours, he has additionally has a sensation of inability to localize his hands, when he basically does not see them or signs them..  Patient denies any IV drug use, or anticoagulant use        Review of Systems    ROS: Other than those specifically stated in the HPI and below, all other systems reviewed and negative.  Constitutional symptoms:  Negative except as documented in HPI.   Skin symptoms:  Negative except as documented in HPI.   Eye  symptoms:  Negative except as documented in HPI.   HENT symptoms:  Negative except as documented in HPI.   Respiratory symptoms:  Negative except as documented in HPI.   Cardiovascular symptoms:  Negative except as documented in HPI.   Gastrointestinal symptoms:  Negative except as documented in HPI.   Genitourinary symptoms:  Negative except as documented in HPI.   Musculoskeletal symptoms:  Negative except as documented in HPI.   Neurologic symptoms:  Negative except as documented in HPI.   Psychiatric symptoms:  Negative except as documented in HPI.   Endocrine symptoms:  Negative except as documented in HPI.   Hematologic/Lymphatic symptoms:  Negative except as documented in HPI.   Allergy/immunologic symptoms:  Negative except as documented in HPI.   Additional review of systems information: All other systems reviewed and otherwise negative.     As reviewed in the HPI. All other systems reviewed are negative or normal.  Review of Systems       Historical Data   Below information reviewed with patient where pertinent:  Past Medical History:   Diagnosis Date    COPD (chronic obstructive pulmonary disease) (CMS HCC)     Gout     Kidney stones     Low back pain        Current Outpatient Medications   Medication Sig    albuterol sulfate (PROVENTIL OR VENTOLIN OR PROAIR) 90 mcg/actuation Inhalation oral inhaler Take 1-2 Puffs by inhalation Every 6 hours as needed    allopurinoL (ZYLOPRIM) 100 mg Oral Tablet Take 1 Tablet (100  mg total) by mouth Twice daily    meloxicam (MOBIC) 15 mg Oral Tablet Take 1 Tablet (15 mg total) by mouth Once a day (Patient not taking: Reported on 12/05/2022)       Allergies   Allergen Reactions    Naproxen Nausea/ Vomiting       Past Surgical History:   Procedure Laterality Date    HX FOOT SURGERY Left     jont implant left toe    HX HERNIA REPAIR      LITHOTRIPSY         Social History     Tobacco Use    Smoking status: Every Day     Current packs/day: 1.00     Types: Cigarettes     Smokeless tobacco: Never   Vaping Use    Vaping status: Never Used   Substance Use Topics    Alcohol use: Yes     Comment: occ    Drug use: Never              Objective  Physical Exam   Filed Vitals:    02/14/23 1658   BP: (!) 169/90   Pulse: 82   Resp: 18   Temp: 36.2 C (97.2 F)   SpO2: 90%       Physical Exam  CONSTITUTIONAL: _well nourished, well developed  SKIN: _warm, dry, no jaundice, hives or petechiae  EYES: _pupils are equally round, extraocular movements intact without nystagmus, erythematous conjunctiva, non-icteric sclera  HENT: _normocephalic, atraumatic, moist mucus membranes, oropharynx clear without exudates  NECK: _Nontender and supple with no nuchal rigidity, no lymphadenopathy, full range of motion  PULMONARY: _clear to auscultation without wheezes, rhonchi, or rales, normal excursion, no accessory muscle use and no stridor  CARDIOVASCULAR: _regular rate, rhythm, normal S1 and S2. No appreciated murmurs. Strong radial pulses with intact distal perfusion  GASTROINTESTINAL: _soft, non-tender, non-distended, no palpable masses, no rebound or guarding  GENITOURINARY: _No costovertebral angle tenderness to palpation  LYMPHATICS: _no edema in lower extremities, no lymphadenopathy  MUSCULOSKELETAL: _Extremities are nontender to palpation and have no gross deformity, no redness, or swelling  NEUROLOGIC: _a/o x 3, GCS 15, normal mentation and speech decreased sensation in dermatomes of bilateral upper extremity, as well as bilateral lower extremity, 5 out 5 elbow flexion and extension noted, bilaterally however 4/5 strength bilaterally, and grip strength.  4-5 bilateral knee flexion and extension.  Positive Lhermitte sign.           Orders and Results  Patient Data    Labs Ordered/Reviewed   BASIC METABOLIC PANEL - Abnormal; Notable for the following components:       Result Value    CREATININE 0.73 (*)     All other components within normal limits   URINALYSIS, MACROSCOPIC - Abnormal; Notable for the  following components:    PROTEIN 30 (*)     All other components within normal limits   URINALYSIS, MICROSCOPIC - Abnormal; Notable for the following components:    SPERMATOZOA URINE Present (*)     All other components within normal limits   CBC   URINALYSIS, MACROSCOPIC AND MICROSCOPIC    Narrative:     The following orders were created for panel order URINALYSIS, MACROSCOPIC AND MICROSCOPIC.                  Procedure  Abnormality         Status                                     ---------                               -----------         ------                                     URINALYSIS, MACROSCOPIC[[id number]]      Abnormal            Final result                               URINALYSIS, MICROSCOPIC[[id number]]      Abnormal            Final result                                                 Please view results for these tests on the individual orders.       Imaging:  No orders to display        Nursing notes, labs, and imaging reviewed.    Orders:  Orders Placed This Encounter    MRI BRAIN W/WO CONTRAST    MRI SPINE CERVICAL W/WO CONTRAST    MRI SPINE THORACIC W/WO CONTRAST    MRI SPINE LUMBOSACRAL W/WO CONTRAST    CBC    BASIC METABOLIC PANEL    URINALYSIS, MACROSCOPIC AND MICROSCOPIC    URINALYSIS, MACROSCOPIC    URINALYSIS, MICROSCOPIC    LORazepam (ATIVAN) 2 mg/mL injection           Medical Decision Making   Medical Decision Making  ED Course as of 02/14/23 1902   Thu Feb 14, 2023   1819 Patient reported anxiety when going to MRI scanner, Ativan provided   1902 UA negative     Based off the patient's clinical presentation as well as physical examination, my primary concerns included evaluating for the presence of:  Acute neurologic symptoms, hematologic and electrolyte abnormalities, MS flare, MRI abnormalities of brain, cervical spine, thoracic spine, lumbar spine    Based on these concerns, a detailed physical exam was immediately performed.  Physical exam showed:  A  57 year old male, with multiple neurologic findings, patient's prior documentation, and MRI from March was reviewed.  Patient has positive lhermitte sign, as well as weakness in hand strength bilaterally with sensation deficits, concern for demyelinating process present, however unclear spinal cord involvement at this time noted, based off lack of spinal cord imaging previously, discussed pros and cons of advanced imaging, based off previous workup as well as ongoing/progressive symptoms, with concern for MS flare, MRI brain, cervical, thoracic, as well as lumbar will be ordered.  Steroid bursts, we will to be deferred at this time of initial evaluation for further delineation of symptoms with advanced imaging.  Patient was at MRI, reported anxiety, and was provided Ativan.  Patient's care was still ongoing at time of shift change, patient's care will  be transitioned to nighttime provider, as patient was currently undergoing MRI, following MRI, patient would benefit from evaluation and possible consultation with neurology based off acute and progressive neurologic symptoms concerning for MS flare, additionally patient would benefit from further delineation of symptoms, to rule out/further evaluate possible compressive symptomatology .  Nighttime provider, stated clear understanding, patient's care was transitioned at 7:00 p.m.    This note was partially created using a voice recognition software and is inherently subject to errors including those of syntax and "sounds like "substitutions, which may escape proof reading.  In such instances, original meaning should be extrapolated by contextual deviation.     Medical Decision Making  Amount and/or Complexity of Data Reviewed  Labs: ordered.  Radiology: ordered.    Risk  Parenteral controlled substances.          Clinical Impression:     Clinical Impression   Abnormal neurological exam (Primary)       Disposition pending at the time of sign out. Care of ALVINO LECHUGA  was signed out to Dr. Daphine Deutscher at 7:00 p.m. following a discussion of the patient's course. Please refer to their Course Note for further details of the patient's ED course.                   Carollee Massed Zotto, DO     History reviewed in chart.  Parts of this patient's chart were completed in a retrospective fashion due to simultaneous direct patient care activities in the Emergency Department.

## 2023-02-14 NOTE — ED Nurses Note (Signed)
MRI called and stated pt is claustrophobic and needed something to be able to start the MRI. Dr. Revonda Standard notified and placing medication orders.

## 2023-02-14 NOTE — ED Nurses Note (Signed)
Report given to [facility], Charity fundraiser. Pt in MRI. Significant other updated on POC.

## 2023-02-14 NOTE — ED Nurses Note (Signed)
Pt resting in bed at this time- states no questions/ concerns. Bed is locked and in lowest position with bilateral bed rails in the raised position, call bell within reach.

## 2023-02-14 NOTE — ED Triage Notes (Signed)
Pt to ED with complaint of intermittent neck pain "that sends a shock" into his back states his arms and legs intermittently go numb as well and he is having difficulty walking.

## 2023-02-14 NOTE — ED Nurses Note (Signed)
Pt sitting on edge of bed for comfort. Visitor at bedside. No verbalized complaints or needs at this time. Pt changed into gown before MRI.

## 2023-02-14 NOTE — ED Nurses Note (Signed)
Report obtained from [place], [place]. Pt currently at MRI.

## 2023-02-14 NOTE — ED Nurses Note (Signed)
Pt returned from MRI. O2 saturation 84% on RA. Pt states he has COPD and normally ranges O2 sat 90-92% on RA. Auscultated wheezes bilateral lung fields. Pt placed on 3L NC and O2 sat elevated to 93-94%. Pt endorses neck pain. Pt denies sob, chest pain, nor any further complaint at this time.     Dr. Daphine Deutscher notified.

## 2023-02-14 NOTE — ED Nurses Note (Signed)
Patient accepted ED to ED transfer per Dr Dorinzi/Dr Sharol Given neurosurgery

## 2023-02-15 ENCOUNTER — Emergency Department (EMERGENCY_DEPARTMENT_HOSPITAL): Payer: MEDICAID

## 2023-02-15 ENCOUNTER — Inpatient Hospital Stay
Admission: EM | Admit: 2023-02-15 | Discharge: 2023-02-20 | DRG: 471 | Disposition: A | Discharge status: Home / Self Care | Payer: MEDICAID | Source: Other Acute Inpatient Hospital | Attending: Internal Medicine | Admitting: Internal Medicine

## 2023-02-15 ENCOUNTER — Inpatient Hospital Stay (HOSPITAL_COMMUNITY): Payer: MEDICAID

## 2023-02-15 DIAGNOSIS — H5462 Unqualified visual loss, left eye, normal vision right eye: Secondary | ICD-10-CM | POA: Diagnosis present

## 2023-02-15 DIAGNOSIS — R9431 Abnormal electrocardiogram [ECG] [EKG]: Secondary | ICD-10-CM

## 2023-02-15 DIAGNOSIS — M4682 Other specified inflammatory spondylopathies, cervical region: Secondary | ICD-10-CM | POA: Diagnosis present

## 2023-02-15 DIAGNOSIS — F17210 Nicotine dependence, cigarettes, uncomplicated: Secondary | ICD-10-CM | POA: Diagnosis present

## 2023-02-15 DIAGNOSIS — E46 Unspecified protein-calorie malnutrition: Secondary | ICD-10-CM | POA: Diagnosis present

## 2023-02-15 DIAGNOSIS — R0602 Shortness of breath: Secondary | ICD-10-CM

## 2023-02-15 DIAGNOSIS — J449 Chronic obstructive pulmonary disease, unspecified: Secondary | ICD-10-CM | POA: Diagnosis present

## 2023-02-15 DIAGNOSIS — E669 Obesity, unspecified: Secondary | ICD-10-CM | POA: Diagnosis present

## 2023-02-15 DIAGNOSIS — M5410 Radiculopathy, site unspecified: Secondary | ICD-10-CM

## 2023-02-15 DIAGNOSIS — Z87442 Personal history of urinary calculi: Secondary | ICD-10-CM

## 2023-02-15 DIAGNOSIS — R200 Anesthesia of skin: Secondary | ICD-10-CM

## 2023-02-15 DIAGNOSIS — G8929 Other chronic pain: Secondary | ICD-10-CM | POA: Diagnosis present

## 2023-02-15 DIAGNOSIS — M109 Gout, unspecified: Secondary | ICD-10-CM | POA: Diagnosis present

## 2023-02-15 DIAGNOSIS — M4802 Spinal stenosis, cervical region: Principal | ICD-10-CM | POA: Diagnosis present

## 2023-02-15 DIAGNOSIS — J9811 Atelectasis: Secondary | ICD-10-CM

## 2023-02-15 DIAGNOSIS — M5412 Radiculopathy, cervical region: Secondary | ICD-10-CM | POA: Diagnosis present

## 2023-02-15 DIAGNOSIS — Z6835 Body mass index (BMI) 35.0-35.9, adult: Secondary | ICD-10-CM

## 2023-02-15 DIAGNOSIS — N200 Calculus of kidney: Secondary | ICD-10-CM

## 2023-02-15 DIAGNOSIS — G992 Myelopathy in diseases classified elsewhere: Secondary | ICD-10-CM | POA: Diagnosis present

## 2023-02-15 DIAGNOSIS — J9621 Acute and chronic respiratory failure with hypoxia: Secondary | ICD-10-CM | POA: Diagnosis present

## 2023-02-15 DIAGNOSIS — S14129A Central cord syndrome at unspecified level of cervical spinal cord, initial encounter: Secondary | ICD-10-CM | POA: Diagnosis present

## 2023-02-15 LAB — CBC WITH DIFF
BASOPHIL #: <0.1 10*3/uL (ref <=0.20)
BASOPHIL %: 0.3 %
EOSINOPHIL #: <0.1 10*3/uL (ref <=0.50)
EOSINOPHIL %: 0 %
HCT: 55.1 % — ABNORMAL HIGH (ref 38.9–52.0)
HGB: 18 g/dL — ABNORMAL HIGH (ref 13.4–17.5)
IMMATURE GRANULOCYTE #: <0.1 10*3/uL (ref <0.10)
IMMATURE GRANULOCYTE %: 0.4 % (ref 0.0–1.0)
LYMPHOCYTE #: 1.19 10*3/uL (ref 1.00–4.80)
LYMPHOCYTE %: 15 %
MCH: 31.3 pg (ref 26.0–32.0)
MCHC: 32.7 g/dL (ref 31.0–35.5)
MCV: 95.7 fL (ref 78.0–100.0)
MONOCYTE #: <0.1 10*3/uL — ABNORMAL LOW (ref 0.20–1.10)
MONOCYTE %: 0.5 %
MPV: 9.4 fL (ref 8.7–12.5)
NEUTROPHIL #: 6.64 10*3/uL (ref 1.50–7.70)
NEUTROPHIL %: 83.8 %
PLATELETS: 242 10*3/uL (ref 150–400)
RBC: 5.76 10*6/uL (ref 4.50–6.10)
RDW-CV: 13.2 % (ref 11.5–15.5)
WBC: 7.9 10*3/uL (ref 3.7–11.0)

## 2023-02-15 LAB — ECG 12-LEAD
Atrial Rate: 99 {beats}/min
Calculated P Axis: 68 degrees
Calculated R Axis: 43 degrees
Calculated T Axis: 72 degrees
PR Interval: 172 ms
QRS Duration: 96 ms
QT Interval: 352 ms
QTC Calculation: 451 ms
Ventricular rate: 99 {beats}/min

## 2023-02-15 LAB — BLOOD GAS W/ LACTATE REFLEX
%FIO2 (VENOUS): 28 %
BASE EXCESS: 3.9 mmol/L — ABNORMAL HIGH (ref 0.0–3.0)
BICARBONATE (VENOUS): 27.5 mmol/L (ref 22.0–29.0)
LACTATE: 4.3 mmol/L (ref <=1.9)
O2 SATURATION (VENOUS): 79.7 %
PCO2 (VENOUS): 58 mm/Hg — ABNORMAL HIGH (ref 41–51)
PH (VENOUS): 7.34 (ref 7.32–7.43)
PO2 (VENOUS): 47 mm/Hg

## 2023-02-15 LAB — URINALYSIS, MACROSCOPIC
BILIRUBIN: NEGATIVE mg/dL
BLOOD: NEGATIVE mg/dL
COLOR: NORMAL
GLUCOSE: >=1000 mg/dL — AB
KETONES: NEGATIVE mg/dL
LEUKOCYTES: NEGATIVE WBCs/uL
NITRITE: NEGATIVE
PH: 6.5 (ref 5.0–8.0)
PROTEIN: 30 mg/dL — AB
SPECIFIC GRAVITY: 1.046 — ABNORMAL HIGH (ref 1.005–1.030)
UROBILINOGEN: NEGATIVE mg/dL

## 2023-02-15 LAB — BLOOD GAS/CO-OX
%FIO2 (VENOUS): 36 %
BASE EXCESS: 1.7 mmol/L (ref 0.0–3.0)
BICARBONATE (VENOUS): 25 mmol/L (ref 22.0–29.0)
CARBOXYHEMOGLOBIN: 4 % — ABNORMAL HIGH (ref <=3.0)
HEMATOCRITRT: 56 % — ABNORMAL HIGH (ref 37–50)
HEMOGLOBIN: 18.5 g/dL — ABNORMAL HIGH (ref 12.0–18.0)
MET-HEMOGLOBIN: <0.7 % (ref <=1.5)
O2 SATURATION (VENOUS): 70.1 %
O2CT: 16.8 %
OXYHEMOGLOBIN: 64.8 %
PCO2 (VENOUS): 62 mm/Hg — ABNORMAL HIGH (ref 41–51)
PH (VENOUS): 7.3 — ABNORMAL LOW (ref 7.32–7.43)
PO2 (VENOUS): 41 mm/Hg

## 2023-02-15 LAB — BLOOD GAS
%FIO2 (VENOUS): 44 %
BASE EXCESS: 4 mmol/L — ABNORMAL HIGH (ref 0.0–3.0)
BICARBONATE (VENOUS): 27.7 mmol/L (ref 22.0–29.0)
O2 SATURATION (VENOUS): 82.9 %
PCO2 (VENOUS): 60 mm/Hg — ABNORMAL HIGH (ref 41–51)
PH (VENOUS): 7.33 (ref 7.32–7.43)
PO2 (VENOUS): 51 mm/Hg

## 2023-02-15 LAB — POC BLOOD GLUCOSE (RESULTS)
GLUCOSE, POC: 258 mg/dl — ABNORMAL HIGH (ref 70–105)
GLUCOSE, POC: 284 mg/dl — ABNORMAL HIGH (ref 70–105)

## 2023-02-15 LAB — BASIC METABOLIC PANEL
ANION GAP: 13 mmol/L (ref 4–13)
BUN/CREA RATIO: 9 (ref 6–22)
BUN: 10 mg/dL (ref 8–25)
CALCIUM: 9.8 mg/dL (ref 8.6–10.2)
CHLORIDE: 100 mmol/L (ref 96–111)
CO2 TOTAL: 25 mmol/L (ref 22–30)
CREATININE: 1.06 mg/dL (ref 0.75–1.35)
ESTIMATED GFR - MALE: 82 mL/min/BSA (ref >=60)
GLUCOSE: 339 mg/dL — ABNORMAL HIGH (ref 65–125)
POTASSIUM: 4.1 mmol/L (ref 3.5–5.1)
SODIUM: 138 mmol/L (ref 136–145)

## 2023-02-15 LAB — FERRITIN: FERRITIN: 60 ng/mL (ref 20–300)

## 2023-02-15 LAB — C-REACTIVE PROTEIN(CRP),INFLAMMATION: CRP INFLAMMATION: 2.3 mg/L (ref <8.0)

## 2023-02-15 LAB — HEPATIC FUNCTION PANEL
ALBUMIN: 3.8 g/dL (ref 3.5–5.0)
ALKALINE PHOSPHATASE: 88 U/L (ref 45–115)
ALT (SGPT): 23 U/L (ref 10–55)
AST (SGOT): 23 U/L (ref 8–45)
BILIRUBIN DIRECT: 0.2 mg/dL (ref 0.1–0.4)
BILIRUBIN TOTAL: 0.4 mg/dL (ref 0.3–1.3)
PROTEIN TOTAL: 7.7 g/dL (ref 6.4–8.3)

## 2023-02-15 LAB — URINALYSIS, MICROSCOPIC
RBCS: 1 /hpf (ref <6.0)
WBCS: <1 /hpf (ref <4.0)

## 2023-02-15 LAB — PTT (PARTIAL THROMBOPLASTIN TIME): APTT: 36.7 seconds (ref 24.2–37.5)

## 2023-02-15 LAB — LACTIC ACID - FIRST REFLEX: LACTIC ACID: 3.6 mmol/L — ABNORMAL HIGH (ref 0.5–2.2)

## 2023-02-15 LAB — PHOSPHORUS: PHOSPHORUS: 1.3 mg/dL — ABNORMAL LOW (ref 2.4–4.7)

## 2023-02-15 LAB — TROPONIN-I (FOR ED ONLY): TROPONIN-I HS: <2.7 ng/L (ref <=35.0)

## 2023-02-15 LAB — VITAMIN B12: VITAMIN B 12: 525 pg/mL (ref 200–900)

## 2023-02-15 LAB — PT/INR
INR: 0.94 (ref 0.80–1.20)
PROTHROMBIN TIME: 11.5 seconds (ref 9.8–14.4)

## 2023-02-15 LAB — CREATINE KINASE (CK), TOTAL, SERUM OR PLASMA: CREATINE KINASE: 97 U/L (ref 45–225)

## 2023-02-15 LAB — IRON TRANSFERRIN AND TIBC
IRON (TRANSFERRIN) SATURATION: 22 % (ref 15–50)
IRON: 87 ug/dL (ref 55–175)
TOTAL IRON BINDING CAPACITY: 391 ug/dL (ref 252–504)
TRANSFERRIN: 279 mg/dL (ref 180–360)

## 2023-02-15 LAB — THYROID STIMULATING HORMONE (SENSITIVE TSH): TSH: 0.357 u[IU]/mL (ref 0.350–4.940)

## 2023-02-15 LAB — FOLATE: FOLATE: 17 ng/mL (ref 7.0–31.0)

## 2023-02-15 LAB — LACTIC ACID - SECOND REFLEX: LACTIC ACID: 3.3 mmol/L — ABNORMAL HIGH (ref 0.5–2.2)

## 2023-02-15 LAB — MAGNESIUM: MAGNESIUM: 1.7 mg/dL — ABNORMAL LOW (ref 1.8–2.6)

## 2023-02-15 MED ORDER — SODIUM PHOSPHATE 3 MMOL/ML INTRAVENOUS SOLUTION
20.0000 mmol | Freq: Once | INTRAVENOUS | Status: AC
Start: 2023-02-15 — End: 2023-02-15
  Administered 2023-02-15: 20 mmol via INTRAVENOUS
  Administered 2023-02-15: 0 mmol via INTRAVENOUS
  Filled 2023-02-15: qty 6.67

## 2023-02-15 MED ORDER — ONDANSETRON HCL (PF) 4 MG/2 ML INJECTION SOLUTION
4.0000 mg | Freq: Four times a day (QID) | INTRAMUSCULAR | Status: DC | PRN
Start: 2023-02-15 — End: 2023-02-20

## 2023-02-15 MED ORDER — SODIUM CHLORIDE 0.9 % (FLUSH) INJECTION SYRINGE
2.0000 mL | INJECTION | INTRAMUSCULAR | Status: DC | PRN
Start: 2023-02-15 — End: 2023-02-20

## 2023-02-15 MED ORDER — IPRATROPIUM 0.5 MG-ALBUTEROL 3 MG (2.5 MG BASE)/3 ML NEBULIZATION SOLN
3.0000 mL | INHALATION_SOLUTION | Freq: Four times a day (QID) | RESPIRATORY_TRACT | Status: DC
Start: 2023-02-15 — End: 2023-02-18
  Administered 2023-02-15 – 2023-02-17 (×7): 3 mL via RESPIRATORY_TRACT
  Administered 2023-02-17: 0 mL via RESPIRATORY_TRACT
  Administered 2023-02-17: 3 mL via RESPIRATORY_TRACT
  Filled 2023-02-15 (×7): qty 3

## 2023-02-15 MED ORDER — IPRATROPIUM 0.5 MG-ALBUTEROL 3 MG (2.5 MG BASE)/3 ML NEBULIZATION SOLN
3.0000 mL | INHALATION_SOLUTION | RESPIRATORY_TRACT | Status: AC
Start: 2023-02-15 — End: 2023-02-15
  Administered 2023-02-15: 3 mL via RESPIRATORY_TRACT

## 2023-02-15 MED ORDER — ALLOPURINOL 100 MG TABLET
100.0000 mg | ORAL_TABLET | Freq: Two times a day (BID) | ORAL | Status: DC
Start: 2023-02-15 — End: 2023-02-20
  Administered 2023-02-15 – 2023-02-20 (×10): 100 mg via ORAL
  Filled 2023-02-15 (×12): qty 1

## 2023-02-15 MED ORDER — INSULIN LISPRO 100 UNIT/ML SUB-Q SSIP
0.0000 [IU] | INJECTION | Freq: Four times a day (QID) | SUBCUTANEOUS | Status: DC | PRN
Start: 2023-02-15 — End: 2023-02-19
  Administered 2023-02-15: 6 [IU] via SUBCUTANEOUS
  Administered 2023-02-16: 2 [IU] via SUBCUTANEOUS
  Administered 2023-02-16 (×2): 4 [IU] via SUBCUTANEOUS
  Administered 2023-02-17 – 2023-02-19 (×5): 2 [IU] via SUBCUTANEOUS
  Filled 2023-02-15: qty 18
  Filled 2023-02-15 (×3): qty 6
  Filled 2023-02-15: qty 12
  Filled 2023-02-15 (×3): qty 6
  Filled 2023-02-15: qty 12

## 2023-02-15 MED ORDER — SODIUM CHLORIDE 0.9% FLUSH BAG - 250 ML
INTRAVENOUS | Status: AC | PRN
Start: 2023-02-15 — End: ?

## 2023-02-15 MED ORDER — HYDROMORPHONE (PF) 0.5 MG/0.5 ML INJECTION SYRINGE
0.5000 mg | INJECTION | INTRAMUSCULAR | Status: DC | PRN
Start: 2023-02-15 — End: 2023-02-20
  Administered 2023-02-15 – 2023-02-19 (×11): 0.5 mg via INTRAVENOUS
  Filled 2023-02-15 (×11): qty 0.5

## 2023-02-15 MED ORDER — NICOTINE 7 MG/24 HR DAILY TRANSDERMAL PATCH
21.0000 mg | MEDICATED_PATCH | TRANSDERMAL | Status: DC
Start: 2023-02-15 — End: 2023-02-15
  Administered 2023-02-15: 21 mg via TRANSDERMAL
  Filled 2023-02-15: qty 1

## 2023-02-15 MED ORDER — IPRATROPIUM 0.5 MG-ALBUTEROL 3 MG (2.5 MG BASE)/3 ML NEBULIZATION SOLN
3.0000 mL | INHALATION_SOLUTION | RESPIRATORY_TRACT | Status: DC
Start: 2023-02-15 — End: 2023-02-15

## 2023-02-15 MED ORDER — METHYLPREDNISOLONE SOD SUCC 125 MG SOLUTION FOR INJECTION WRAPPER
125.0000 mg | INTRAVENOUS | Status: AC
Start: 2023-02-15 — End: 2023-02-15
  Administered 2023-02-15: 125 mg via INTRAVENOUS
  Filled 2023-02-15: qty 2

## 2023-02-15 MED ORDER — MAGNESIUM SULFATE 2 GRAM/50 ML (4 %) IN WATER INTRAVENOUS PIGGYBACK
2.0000 g | INJECTION | INTRAVENOUS | Status: AC
Start: 2023-02-15 — End: 2023-02-15
  Administered 2023-02-15: 0 g via INTRAVENOUS
  Administered 2023-02-15: 2 g via INTRAVENOUS
  Filled 2023-02-15: qty 50

## 2023-02-15 MED ORDER — PREDNISONE 20 MG TABLET
40.0000 mg | ORAL_TABLET | Freq: Every day | ORAL | Status: AC
Start: 2023-02-16 — End: 2023-02-19
  Administered 2023-02-16 – 2023-02-19 (×4): 40 mg via ORAL
  Filled 2023-02-15 (×4): qty 2

## 2023-02-15 MED ORDER — ENOXAPARIN 40 MG/0.4 ML SUBCUTANEOUS SYRINGE
40.0000 mg | INJECTION | Freq: Every day | SUBCUTANEOUS | Status: DC
Start: 2023-02-16 — End: 2023-02-20
  Administered 2023-02-16: 40 mg via SUBCUTANEOUS
  Filled 2023-02-15: qty 0.4

## 2023-02-15 MED ORDER — IOPAMIDOL 370 MG IODINE/ML (76 %) INTRAVENOUS SOLUTION
100.0000 mL | INTRAVENOUS | Status: AC
Start: 2023-02-15 — End: 2023-02-15
  Administered 2023-02-15: 100 mL via INTRAVENOUS

## 2023-02-15 MED ORDER — POLYETHYLENE GLYCOL 3350 17 GRAM ORAL POWDER PACKET
17.0000 g | Freq: Every day | ORAL | Status: DC
Start: 2023-02-16 — End: 2023-02-19
  Administered 2023-02-16: 17 g via ORAL
  Administered 2023-02-17 – 2023-02-18 (×2): 0 g via ORAL
  Filled 2023-02-15 (×3): qty 1

## 2023-02-15 MED ORDER — ALBUTEROL SULFATE 2.5 MG/3 ML (0.083 %) SOLUTION FOR NEBULIZATION
2.5000 mg | INHALATION_SOLUTION | RESPIRATORY_TRACT | Status: DC | PRN
Start: 2023-02-15 — End: 2023-02-20
  Administered 2023-02-15 – 2023-02-17 (×2): 2.5 mg via RESPIRATORY_TRACT
  Filled 2023-02-15 (×2): qty 3

## 2023-02-15 MED ORDER — IPRATROPIUM 0.5 MG-ALBUTEROL 3 MG (2.5 MG BASE)/3 ML NEBULIZATION SOLN
3.0000 mL | INHALATION_SOLUTION | RESPIRATORY_TRACT | Status: DC
Start: 2023-02-15 — End: 2023-02-15
  Filled 2023-02-15: qty 3

## 2023-02-15 MED ORDER — NICOTINE 21 MG/24 HR DAILY TRANSDERMAL PATCH
21.0000 mg | MEDICATED_PATCH | TRANSDERMAL | Status: AC
Start: 2023-02-15 — End: 2023-02-16
  Administered 2023-02-15: 21 mg via TRANSDERMAL
  Filled 2023-02-15: qty 1

## 2023-02-15 MED ORDER — DIAZEPAM 5 MG/ML INJECTION SYRINGE
5.0000 mg | INJECTION | INTRAMUSCULAR | Status: DC | PRN
Start: 2023-02-15 — End: 2023-02-20
  Administered 2023-02-15: 5 mg via INTRAVENOUS

## 2023-02-15 MED ORDER — SODIUM CHLORIDE 0.9 % (FLUSH) INJECTION SYRINGE
2.0000 mL | INJECTION | Freq: Three times a day (TID) | INTRAMUSCULAR | Status: DC
Start: 2023-02-15 — End: 2023-02-20
  Administered 2023-02-15: 0 mL
  Administered 2023-02-15: 2 mL
  Administered 2023-02-16: 6 mL
  Administered 2023-02-16: 0 mL
  Administered 2023-02-16: 2 mL
  Administered 2023-02-17: 6 mL
  Administered 2023-02-17: 2 mL
  Administered 2023-02-17 – 2023-02-18 (×2): 0 mL
  Administered 2023-02-18 – 2023-02-19 (×3): 2 mL
  Administered 2023-02-19 – 2023-02-20 (×4): 0 mL

## 2023-02-15 MED ORDER — ALBUTEROL SULFATE CONCENTRATE 2.5 MG/0.5 ML SOLUTION FOR NEBULIZATION
2.5000 mg | INHALATION_SOLUTION | RESPIRATORY_TRACT | Status: AC
Start: 2023-02-15 — End: 2023-02-15
  Administered 2023-02-15: 2.5 mg via RESPIRATORY_TRACT
  Filled 2023-02-15: qty 1

## 2023-02-15 MED ORDER — IPRATROPIUM 0.5 MG-ALBUTEROL 3 MG (2.5 MG BASE)/3 ML NEBULIZATION SOLN
3.0000 mL | INHALATION_SOLUTION | Freq: Four times a day (QID) | RESPIRATORY_TRACT | Status: DC
Start: 2023-02-15 — End: 2023-02-15

## 2023-02-15 MED ORDER — OXYCODONE 5 MG TABLET
5.0000 mg | ORAL_TABLET | ORAL | Status: DC | PRN
Start: 2023-02-15 — End: 2023-02-19

## 2023-02-15 MED ORDER — DEXTROSE 5% IN WATER (D5W) FLUSH BAG - 250 ML
INTRAVENOUS | Status: AC | PRN
Start: 2023-02-15 — End: ?

## 2023-02-15 MED ORDER — OXYCODONE 10 MG TABLET
10.0000 mg | ORAL_TABLET | ORAL | Status: DC | PRN
Start: 2023-02-15 — End: 2023-02-19
  Administered 2023-02-15 – 2023-02-18 (×14): 10 mg via ORAL
  Administered 2023-02-19: 0 mg via ORAL
  Administered 2023-02-19: 10 mg via ORAL
  Filled 2023-02-15 (×16): qty 1

## 2023-02-15 MED ORDER — ALBUTEROL SULFATE 2.5 MG/3 ML (0.083 %) SOLUTION FOR NEBULIZATION
10.0000 mg | INHALATION_SOLUTION | RESPIRATORY_TRACT | Status: AC
Start: 2023-02-15 — End: 2023-02-15
  Administered 2023-02-15: 10 mg via RESPIRATORY_TRACT
  Filled 2023-02-15: qty 12

## 2023-02-15 MED ORDER — ALBUTEROL SULFATE 2.5 MG/3 ML (0.083 %) SOLUTION FOR NEBULIZATION
2.5000 mg | INHALATION_SOLUTION | RESPIRATORY_TRACT | Status: AC
Start: 2023-02-15 — End: 2023-02-15
  Administered 2023-02-15: 2.5 mg via RESPIRATORY_TRACT
  Filled 2023-02-15: qty 3

## 2023-02-15 MED ORDER — DIAZEPAM 5 MG/ML INJECTION SYRINGE
INJECTION | INTRAMUSCULAR | Status: AC
Start: 2023-02-15 — End: 2023-02-15
  Filled 2023-02-15: qty 2

## 2023-02-15 MED ORDER — ACETAMINOPHEN 325 MG TABLET
650.0000 mg | ORAL_TABLET | ORAL | Status: DC | PRN
Start: 2023-02-15 — End: 2023-02-19
  Administered 2023-02-16: 650 mg via ORAL
  Filled 2023-02-15: qty 2

## 2023-02-15 NOTE — ED Nurses Note (Signed)
Tina from [facility] called back saying that the [place] crew has timed out. They will try and get the patient to [facility]. Have an ALS crew that comes on at 8. But Inetta Fermo said that she will try and continue and hope that we can get the patient out sooner that 8am to [facility].Dr. Daphine Deutscher made aware

## 2023-02-15 NOTE — ED Nurses Note (Addendum)
The patient became angry with staff and is refusing oxygen supplement and further vital sign monitoring. The pt states he is leaving; awaiting ride from his wife. Dr. Daphine Deutscher called to bedside. The pt states that we do not care about him or his wellbeing. House supervisor and RT contacted to speak to the patient with this RN. It has been explained to the patient the severe risks of leaving against medical advise. The pt states "you all don't give a fuck about me because you haven't provided a ride to [facility]" The pt was reassured by RN and house supervisor that we do care, and that we want him to get to the appropriate facility and reapply the oxygen supplement. The pt remains firm on refusal.     RT was contacted to see if we could provide an oxygen tank to get the patient to [facility]. RT states that we do not have the capability to do that.     The pt is refusing to sign the leaving against medical advice paper.

## 2023-02-15 NOTE — H&P (Addendum)
[facility]  Division of [facility]  Admission H&P    Date of Service:  02/15/2023  [name] [name], [name] [name], 57 y.o. male  Date of Admission:  02/15/2023  Date of Birth:  [birth year]  PCP: Cecilie Kicks, DO          Information Obtained from: patient  Chief Complaint:  BUE/BLE weakness    HPI: [name] [name] is a 57 y.o., male with PMH significant for COPD, gout who presents with progressive weakness.    Patient states that about 2 months PTA he came home from work, sat down, coughed or sneezed and had sudden radicular pain that radiated to all his extremities which occurred a few more times. Patient then noted that every time he would open his mouth or eat he would get radicular pain down his extremities or down to his lower back which required him to keep his head down while eating and while sitting in general. States additional movements such as tilting head up or turning head too far in any direction also generated radicular pain. He states eventually the radicular pain stopped being prominent down his extremities and would instead radiate primarily down to his lower back. With the above, he began to noticed progressively worsening weakness in all extremities which he states has progressively worsened over the last week. States he has been unable to open jars with his hands or walk more than about 63ft without developing medial thigh weakness and numbness requiring that he sit down. Reports a couple falls in which he has caught himself because of the weakness but denies trauma. States he is unable to tell exactly where his hands or feet are in space without being able to see them and reported severely diminished discriminate touch with finger tips. Patient had an EMG 12/12/22 which was read as moderate bilateral CTS. He presented to [facility] on 4/11 with above symptoms and underwent MRI C/T/L spine with concern for C3-4 severe canal narrowing. He was transferred to [facility] for further spine evaluation.    In  addition, patient states he has been noted to be hypoxic since initiation of symptoms. States when he first saw his PCP he was noted to have O2 sats of 83-84% on RA. Does not appear he was ever placed on O2. States he was recently placed on albuterol PRN inhaler. States he has DOE but denies SOB at rest and originally attributed it to his issues with weakness. States he is still a smoker. 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     In the ED, patient was HDS but noted to require 6 L to maintain O2 sats of 88-90%. NSGY evaluated and recommended repeat MRI C spine without contrast given degraded imaging. Neuro evaluated without note in at time of admission. CT CAP without acute process. He was given albuterol, duoneb, IVMP  x 1, and admitted to medicine for further management. Of note, patient was noted to only drop to 84-85% when removing oxygen periodically without complaint of being SOB.     PAST MEDICAL:    Past Medical History:   Diagnosis Date    COPD (chronic obstructive pulmonary disease) (CMS HCC)     Gout     Kidney stones     Low back pain         Past Surgical  History:   Procedure Laterality Date    HX FOOT SURGERY Left     jont implant left toe    HX HERNIA REPAIR      LITHOTRIPSY              Medications Prior to Admission       Prescriptions    albuterol sulfate (PROVENTIL OR VENTOLIN OR PROAIR) 90 mcg/actuation Inhalation oral inhaler    Take 1-2 Puffs by inhalation Every 6 hours as needed    allopurinoL (ZYLOPRIM) 100 mg Oral Tablet    Take 1 Tablet (100 mg total) by mouth Twice daily          Allergies   Allergen Reactions    Naproxen Nausea/ Vomiting         Family History  Noncontributory    Social History  Social History          ROS: Other than ROS in the HPI, all other systems were negative.    Examination:  Temperature: 36.9 C  (98.4 F) Heart Rate: 91 BP (Non-Invasive): 134/85   Respiratory Rate: 20 SpO2: 90 %     Constitutional: Alert and oriented x 3, uncomfortable, appears age appropriate   Eyes: Conjunctiva clear., Sclera non-icteric.   ENT: ENMT without erythema or injection, mucous membranes moist.  Neck: supple, symmetrical, trachea midline  Respiratory: Clear to auscultation bilaterally but diminished.. No wheeze or rales.   Cardiovascular: regular rate and rhythm, S1, S2 normal, no murmur, click, rub or gallop  Gastrointestinal: Soft, non-tender, Bowel sounds normal, non-distended,   Musculoskeletal: Head atraumatic and normocephalic. No significant edema  Integumentary:  Skin warm and dry, No rashes and No lesions  Neurologic: 5/5 strength in BUE/BLE, decreased movement of fingers bilaterally (worse on left), decreased sensation to light touch and discriminate touch bilaterally (worse on LUE and LLE), CN II - XII grossly intact , No tremor  Psychiatric: Normal affect, behavior      Labs:  (I have review all labs available. See A/P for my assessment of lab data)  I have reviewed all lab results.    Imaging Studies:  (I have independently reviewed all imaging and tests available. See below for interpretation of imaging studies)       Consults: I have personally reviewed the notes of all consulting physicians, relevant previous outside providers, emergency room/referring providers.         I have ordered tests related to the inpatient managed diagnoses and problems  (see below for specific orders)    DNR Status:  Full Code    Assessment/Plan:   [name] [name] is a 57 y.o., male with PMH significant for COPD, gout who presents with progressive weakness.    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 Will  order. 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 Patient ideally would like to get off Suboxone entirely     Primary admission Problem: BUE/BLE weakness, radiculopathy  Life-Threatening        DVT/PE Prophylaxis: lovenox  Med Rec: per patient    I, as the attending physician, have completed the completed the assessment of this patient and my medical decision making process deems that this patient meets appropriate criteria to be admitted to the inpatient hospital for management of the above listed conditions and pathology.    INITIAL H&P LEVEL 3 (TOTAL TIME > 75 MINUTES) (79024)    On 02/15/2023 I spent a total visit time of 80 minutes. Time included review of tests and ordering tests, obtaining/reviewing history, examining the patient, communicating with consultants, documenting clinical information and counseling the patient and/or family regarding the diagnosis and management plan and coordination of care involved services directly related to patient care.    Murtis Sink, MD

## 2023-02-15 NOTE — ED Nurses Note (Signed)
Report received from previous RN. Patient care is assumed at this time. Patient to be updated on plan of care and have opportunity for questions or concerns.

## 2023-02-15 NOTE — Nurses Notes (Signed)
Notified by ED RN that patient was requesting to leave AMA, patient has been waiting for ED to ED transport to [facility]; spoke to patient with RN and RT present; patient stated that he was not waiting any longer and that we "were not doing anything to get him transferred and have been lying to him all night", explained EMS transport limitations, that we have contacted 2 different EMS services, and that he would be transported as soon as a crew was available; pt cursed at staff and said his wife was on her way and he would not wait any longer; advised that he was requiring oxygen and he should stay were he could be monitored and provided with supplemental oxygen, asked if there was anything we could do to make him more comfortable while he waited for transport; pt continued to curse at staff and said he was leaving; refused to sign AMA papers.

## 2023-02-15 NOTE — ED Nurses Note (Signed)
[facility]. [place] Can not go that far to [facility] but if Could get to [facility] they would transfer the patient. DR. Daphine Deutscher made aware and said patient needs [place].

## 2023-02-15 NOTE — ED Nurses Note (Signed)
Called [facility] spoke with [place]. Informed her that the patient walked out of the ED. Canceled Transport to [facility].

## 2023-02-15 NOTE — Consults (Signed)
[facility]  Neurology Initial Consult    [name], [name] T, 57 y.o. male  Date of Admission:  02/15/2023  Date of Birth:  29-Dec-1965    PCP: Cecilie Kicks, DO    Information obtained from: patient and history reviewed via medical record  Chief Complaint:  BUE and BLE weakness and paresthesias    HPI: [name] [name] is a 57 y.o., White male who presents with PMH of COPD who presents with weakness and paraesthesias of the BUE and BLE since 2 months ago, symptoms have been worsening over course of past 1-2 weeks. He initially presented to [facility] on 01/18/23. MRI brain showed nonspecific white matter disease, possibly due to small vessel changes or a demyelinating process. Repeat MRI brain on 02/14/23 was stable. MRI cervical showed severe canal narrowing at C3-4. MRI thoracic was unremarkable. Patient was then transferred to [facility] ED for further workup.     On encounter, patient shares that above symptoms have started affecting his ability to complete ADL's, including ambulation around his home, activity limited by radiating pain from neck down to lower back and often to arms and legs. States he has been having to eat with his neck slightly flexed, otherwise he experiences aforementioned radiating pain. Denies dysphagia, dysarthria, vision changes, urinary or bowel incontinence, saddle anesthesia, recent trauma, or history of prior spine surgeries. Not on anticoagulation or antiplatelet therapies. While in ED, patient also experiencing SOB with O2 sats in the upper 80's to low 90's. Reports SOB at baseline, albuterol typically helpful, does not use supplemental O2 at home.     Admission Source:  Transfer from another hospital -  [facility]  Advance Directives:  None-Discussed  Hospice involvement prior to admission?  Not applicable      Past Medical History:   Diagnosis Date    COPD (chronic obstructive pulmonary disease) (CMS HCC)     Gout     Kidney stones     Low back pain        Past  Surgical History:   Procedure Laterality Date    HX FOOT SURGERY Left     jont implant left toe    HX HERNIA REPAIR      LITHOTRIPSY         Medications Prior to Admission       Prescriptions    albuterol sulfate (PROVENTIL OR VENTOLIN OR PROAIR) 90 mcg/actuation Inhalation oral inhaler    Take 1-2 Puffs by inhalation Every 6 hours as needed    allopurinoL (ZYLOPRIM) 100 mg Oral Tablet    Take 1 Tablet (100 mg total) by mouth Twice daily    meloxicam (MOBIC) 15 mg Oral Tablet    Take 1 Tablet (15 mg total) by mouth Once a day    Patient not taking:  Reported on 12/05/2022          Allergies   Allergen Reactions    Naproxen Nausea/ Vomiting     Social History     Tobacco Use    Smoking status: Every Day     Current packs/day: 1.00     Types: Cigarettes    Smokeless tobacco: Never   Substance Use Topics    Alcohol use: Yes     Comment: occ     Family Medical History:    None         ROS: Other than ROS in the HPI, all other systems were negative.    Exam:  Temperature: 36.9 C (98.4  F)  Heart Rate: 95  BP (Non-Invasive): 134/85  Respiratory Rate: 18  SpO2: 99 %  General: No acute distress.  HENT: Mucous membranes are moist.  Neck: Supple.  Carotids: Symmetric carotid pulses.  Lungs: No respiratory distress. No accessory muscle use.  Cardiovascular: Regular rate and rhythm.  Abdomen: No abdominal distension.  Extremities: No calf tenderness.  Ophthalomscopic: normal w/o hemorrhages, exudates, or papilledema  Glasgow: Eye opening:  4 spontaneous, Verbal response:  5 oriented, Best motor response:  6 obeys commands  Mental status:  Level of Consciousness: alert  Orientations: Alert and oriented x 3  Memory: Normal memory to conversation. Following commands.  Attention: Normal attention and concentration to conversation.  Knowledge: Appropriate.  Language: No aphasia.  Speech: No dysarthria.  Cranial nerves:   CN2: Visual acuity and fields intact  CN 3,4,6: EOMI, PERRLA, no diplopia with sustained upgaze  CN 5: Facial  sensation intact  CN 7: Face symmetrical  CN 8: Hearing grossly intact  CN 9,10: Uvula is midline.  CN 11: Sternocleidomastoid and Trapezius have normal strength.  CN 12: Tongue normal with no fasiculations or deviation  Gait, Coordination, and Reflexes:   Gait: unable to assess. Reason: Fall risk  Coordination: Coordination is normal without tremor  Muscle tone: WNL  Muscle exam: motor strength exam limited by radiating neck pain, able to briefly give full strength  Arm Right Left Leg Right Left   Deltoid 5/5 5/5 Iliopsoas 5/5 5/5   Biceps 5/5 5/5 Quads 5/5 5/5   Triceps 5/5 5/5 Hamstrings 5/5 5/5   Wrist Extension 5/5 5/5 Ankle Dorsi Flexion 5/5 5/5   Wrist Flexion 5/5 5/5 Ankle Plantar Flexion 5/5 5/5   Interossei 4/5 4/5 Ankle Eversion 5/5 5/5   APB 4/5 4/5 Ankle Inversion 5/5 5/5     Reflexes   RJ BJ TJ KJ AJ Plantars Hoffman's   Right 3+ 3+ 3+ 3+ 3+ Downgoing present   Left 3+ 3+ 3+ 3+ 3+ Downgoing present     Of note, ankle clonus present bilaterally    Sensory: Diminished sensation to light and sharp touch in fingers to elbows and toes to knees. Diminished proprioception in both UE and LE bilaterally.    Labs:    I have reviewed all lab results.    Review of reports and notes reveal:   MRI BRAIN WO CONTRAST performed on 01/18/2023 12:01 PM  IMPRESSION:  Nonspecific white matter disease that could be due to small vessel scanning changes or demyelinating process.    MRI BRAIN W/WO CONTRAST performed on 02/14/2023 8:36 PM  IMPRESSION:  Stable MRI of the brain within the limitations of the exam.    MRI SPINE CERVICAL W/WO CONTRAST performed on 02/14/2023 8:37 PM  IMPRESSION:  The exam is severely limited by motion. There does appear to be severe canal narrowing at C3-C4.    MRI SPINE THORACIC W/WO CONTRAST performed on 02/14/2023 8:41 PM  IMPRESSION:  UNREMARKABLE THORACIC SPINE MRI WITHOUT AND WITH CONTRAST.    XR CERVICAL SPINE AP AND LATERAL performed on 02/15/2023 12:27 PM  IMPRESSION:  No acute fracture or  traumatic subluxation in the cervical spine within study limitations.      Assessment/Plan:  There are no active hospital problems to display for this patient.    RUE TINNEL is a 57 y.o., White male who presents with PMH of COPD who presents with weakness and paraesthesias of the BUE and BLE since 2 months ago, symptoms have been worsening over  course of past 1-2 weeks.     Weakness and Paresthesias in BUE and BLE  MRI brain 01/18/23 showed nonspecific white matter disease, possibly due to small vessel changes or a demyelinating process.   Repeat MRI brain on 02/14/23 was stable. MRI cervical showed severe canal narrowing at C3-4. MRI thoracic was unremarkable.  XR cervical AP/L on 4/12 with no acute fx or traumatic subluxation.  Recommend Neurosurgery consultation as patient history and exam are consistent with cervical pathology     Thank you for this consult and allowing [place] to participate in the care of this patient. Please page with any further questions or concerns.       DNR Status this admission:  Full Code  Palliative/Supportive Care consulted?  no  Hospice Consulted?  Not applicable    Current Comorbid Conditions - Neurology Consult  Severe Brain Conditions: NA  Coma (GCS less than 8):Coma -Not applicable  TIA not applicable  Encephalopathy:  no  Encephalitis-Not applicable  Seizure-Not applicable   COPD  Renal Failure: No  Coagulopathy Not applicable    DVT/PE Prophylaxis:  None    Daleen Bo, MD  02/15/23  Neurology PGY-1  [facility]  Pager # [phone]    Patient was staffed with attending this morning (4/13). NAEO. Still reporting weakness and paraesthesias of bilateral UE and LE. Endorses some difficulty with initiation of urinary stream but denies bladder/bowel incontinence. Neurosurgery on board with likely surgical intervention given findings of high-grade cervical stenosis at C3-C4 on MRI. Neurology service will sign off at this time. Will have patient follow-up with  neurology on an outpatient basis in a few months. Please page with any questions or concerns.     Marchia Bond, DO 02/16/2023 07:52  Psychiatry Resident, PGY-1  Department of Behavioral Medicine & Psychiatry  [address] [place], [place] [address]  Phone: [phone]  Fax: [phone]      I saw and examined the patient.  I reviewed the resident's note.  I agree with the findings and plan of care as documented in the resident's note.  Any exceptions/additions are edited/noted.    Georgina Peer, MD

## 2023-02-15 NOTE — ED Nurses Note (Signed)
The patient walked out of the ED, refused an attempt to persuade him back into the ED. PIV was removed outside and dressed appropriately. The pt was told to please stop at the nearest emergency department if he declines further in health. Risks again have been explained to the patient. The patient got into a vehicle driven by wife and departed hospital.

## 2023-02-15 NOTE — Nurses Notes (Signed)
Patient reported severe pain of 9/10. Upon further assessment, patient described as tingling and numbness on bilateral upper and lower extremities. POC glucose checked 258. Notified Hospitalist 3 service.

## 2023-02-15 NOTE — ED Nurses Note (Signed)
Report called to 5EOBS RN.

## 2023-02-15 NOTE — ED Nurses Note (Signed)
Admitting at bedside 

## 2023-02-15 NOTE — Nurses Notes (Signed)
Pt transfer from ED to 5E room 03. Pt A&Ox4, pt still having back pain, numbness upper, lower extremity. Pt get up assist stand-by at this time. Call bell within reach. Will monitor.

## 2023-02-15 NOTE — Nurses Notes (Signed)
Patient arrived to MRI in bed via RN.

## 2023-02-15 NOTE — ED Nurses Note (Signed)
Pt resting in bed at this time- states no questions/ concerns. Bed is locked and in lowest position with bilateral bed rails in the raised position, call bell within reach.

## 2023-02-15 NOTE — ED Nurses Note (Signed)
Called [employer] spoke with [place] at first then was transferred to [place] on transport. [place] [employer] [place]. Will find out once they are done with drop off. Will call either way to let us know.

## 2023-02-15 NOTE — Respiratory Therapy (Signed)
RN called RT with concerns of patient not wearing oxygen and wanting to leave. RT entered room to assess patient. Patient on room air, SPO2 is 83%. RT encouraged patient to wear nasal cannula. Patient stated he is leaving and his wife is coming to get him. Patient educated on importance of wearing oxygen device at this time. Patient refused to wear any oxygen device and removed pulse ox himself. RN supervisor spoke with patient who stated he no longer wants to be here and is leaving. Patient stated he will be fine and he's lived like this for months. Dr. Daphine Deutscher made aware of patient's decision to leave and patient refusing to put nasal cannula back on

## 2023-02-15 NOTE — ED Provider Notes (Signed)
[name] [name]  01-07-1966  57 y.o.  male    Chief Complaint:   Chief Complaint   Patient presents with    Neck Pain             Medical Decision Making:   I assumed care for this patient at 1900 hours pending MRI results.  The MRI techs were able to do the noncontrast MRIs of the head, neck, and thoracic spine before the patient became claustrophobic in agitated again.  He was reamed medicated with 2 mg of Ativan IV and the contrast imaging was done Accept for the lumbar spine.  The MRI of the brain showed scattered hyperintense lesions throughout the deep and subcortical white matter similar to the previous MRI.  The MRI of the cervical spine showed severe canal narrowing of the C3 and C4-5 level with moderate bilateral neuroforaminal narrowing.  The remainder of the MRIs were unremarkable.  I did speak with the spinal specialist on-call at [facility], Dr. Sharol Given, who accepted him for transfer and advised to transfer him to their emergency department.  While under my care he would to be placed on supplemental oxygen due to his oxygen saturation dropping to the low 80s.  While awaiting ambulance availability for transfer he became irritated and requested to have his IV and oxygen remove so his significant other could drive him to the emergency department down there.  I did have a discussion with him concerning the risks of making the trip by private vehicle without supplemental oxygen.  Patient stated that he understood the risks but could not sit in the emergency department and weight on her ambulance any longer.  He did elect to leave in spite of my warnings.  He was advised to proceed directly to the emergency department at [facility].             Clinical Impression:   Clinical Impression   Abnormal neurological exam (Primary)   Cervical spinal stenosis           Disposition: Transfered to Another Facility                 Procedures

## 2023-02-15 NOTE — Respiratory Therapy (Signed)
02/15/23 0811   Respiratory Parameters   Start Time 0806   1st Attempt 1.42 Liters   2nd Attempt 1.72 Liters   3rd Attempt 1.68 Liters   Average FVC 1.6 Liters   1st Attempt -60 cmH2O   2nd Attempt -45 cmH2O   3rd Attempt -43 cmH2O   Average NIF -49 cmH2O   Respiratory Effort Good   $Parameters (Resp only) Completed   Stop Time 0811   Duration 5 Minutes

## 2023-02-15 NOTE — Consults (Signed)
[facility]  Neurosurgery Consult  Initial Consult Note      [name], [name], 57 y.o. male  Date of Admission:  02/15/2023  Date of Service: 02/15/2023  Date of Birth:  07-19-1966    Information Obtained from: patient  Chief Complaint: weakness    HPI:  [name] [name] is a 57 y.o., White male with history of COPD and nephrolithiasis who presents with weakness and paraesthesias of the BUE and BLE for the past 2 months that have been acutely worsening over the past 2 weeks. He initially presented to [facility] ED where imaging was done. MRI brain wo (3/15//24) suggests nonspecific white matter disease that could be due to small vessel changes or a demyelinating process. Repeat MRI brain wwo (02/14/23) is stable. Neurology has been consulted for this. 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 MRI thoracic wwo (02/14/23) is unremarkable. Patient was then transferred to [facility] ED for further workup and neurosurgery was consulted. CT CAP w (02/15/23) shows no abnormalities other than a non-obstructing right-sided renal calculus. He has also been experiencing shortness of breath with O2 sats in the upper 80's to low 90's.     On exam, he denies gait instability, falls, urinary or bowel incontinence, saddle anesthesia, recent trauma, or history of prior spine surgeries. Denies taking any anticoagulant or antiplatelet medications. 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     ROS: (MUST comment on all "Abnormal" findings)  Other than ROS in the HPI, all other systems were negative.    PAST MEDICAL/ FAMILY/ SOCIAL HISTORY:   Past Medical History:   Diagnosis Date    COPD (chronic obstructive pulmonary disease) (CMS HCC)     Gout     Kidney stones     Low back pain          Medications Prior to Admission       Prescriptions    albuterol sulfate (PROVENTIL OR VENTOLIN OR PROAIR) 90 mcg/actuation Inhalation oral inhaler    Take 1-2 Puffs  by inhalation Every 6 hours as needed    allopurinoL (ZYLOPRIM) 100 mg Oral Tablet    Take 1 Tablet (100 mg total) by mouth Twice daily    meloxicam (MOBIC) 15 mg Oral Tablet    Take 1 Tablet (15 mg total) by mouth Once a day    Patient not taking:  Reported on 12/05/2022           No current facility-administered medications for this encounter.    Allergies   Allergen Reactions    Naproxen Nausea/ Vomiting     Past Surgical History:   Procedure Laterality Date    HX FOOT SURGERY Left     jont implant left toe    HX HERNIA REPAIR      LITHOTRIPSY           Social History     Tobacco Use    Smoking status: Every Day     Current packs/day: 1.00     Types: Cigarettes    Smokeless tobacco: Never   Vaping Use    Vaping status: Never Used   Substance Use Topics    Alcohol use: Yes     Comment: occ    Drug use: Never       PHYSICAL EXAMINATION: (MUST comment on all "Abnormal" findings)    Constitutional  Temperature: 36.9 C (98.4 F)  Heart Rate: Marland Kitchen)  104  BP (Non-Invasive): 134/85  Respiratory Rate: 16  SpO2: (!) 89 %    General: Appears stated age, NAD  Cardio: Radial pulses intact bilaterally  ENT: Trachea midline  Respiratory: Regular respirations  Skin: Skin pink    Neurologic Exam:  A&Ox3  GCS 4 6 5   Fluent speech  Appropriate fund of knowledge  Appropriate attention span & concentration  Appropriate recent and remote memory  CN 2 PERRL  CN 3 4 6  EOMI  CN 7 Face symmetric  CN 8 Hearing grossly intact  CN 11 shrug symmetric  CN 12 Tongue midline  Muscle Strength 5/5 BUE except 4/5 grip strength, 5/5 BLE  Muscle tone WNL  Paresthesias to BUE and BLE  No drift  No hoffman   Plantars downgoing  No clonus    Current Comorbid Conditions - Neurosurgery Consult  Severe Brain Conditions: NA  Coma less than 8: Coma -Not applicable  Loss of Consciousness: no  Encephalopathy No   Stroke: Not applicable  Craniectomy: no  Seizure: Seizure-Not applicable  Respiratory Conditions: COPD  Renal Failure: No  Acute Blood Loss Anemia:  No  N/A  Coagulopathy: Not applicable  Acid-Base Disorders: No   N/A  Hemiplegia/Hemiparesis/Paraplegia: No   Fatigue/Debility: No Weakness   Muscle Wasting and atrophy (specify location):  N/A    Impression/Recommendations  [name] [name] is a 57 y.o. male with PMH of COPD and renal nephrolithiasis who presents with a 2 month history of BUE and BLE weakness and paraesthesias, acutely worsening over the past 2 weeks.       -- Please obtain XR cervical upright if the patient can stand  -- Repeat MRI C spine w/o contrast given poor quality of the most recent one.   -- Recommend neurology workup   -- Recommend Medicine for respiratory status   -- Will review with staff and update plan accordingly    -- Imaging:    -- MRI C spine: RECOMMENDED   -- XR cervical upright (02/15/23): ORDERED   -- CT CAP w (02/15/23): unremarkable other than right-sided non-obstructive renal calculus   -- MRI thoracic wwo (02/14/23): unremarkable   -- MRI cervical wwo (02/14/23): severe spinal canal stenosis at C3-4   -- MRI brain wwo (02/14/23): nonspecific white matter disease that could be small vessel changes or demyelinating process      Joette Catching, PA-C 02/15/2023, 10:45  Department of Neurosurgery- Spine  Pager [phone], Phone [phone]    I independently of the faculty provider spent a total of 46 minutes in direct/indirect care of this patient including initial evaluation, review of laboratory, radiology, diagnostic studies, review of medical record, patient/ family education, order entry and coordination of care.    I personally saw and evaluated the patient. See mid-level's note for additional details. 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  Based on this patient's improvement from a respiratory state will almost certainly require an  anterior cervical diskectomy fusion and possible a posterior cervical fusion based on MRI results and exam.    Mariann Laster, MD

## 2023-02-15 NOTE — ED Nurses Note (Signed)
Pt being transported to floor via EDT

## 2023-02-15 NOTE — ED Nurses Note (Signed)
Called [employer] spoke with [name] [name], ALS crew is timing out. BLS crew still enroute to [place] at this time with the [employer]. Unknown what BLS crew will do.

## 2023-02-15 NOTE — Care Plan (Signed)
Problem: Adult Inpatient Plan of Care  Goal: Plan of Care Review  Outcome: Ongoing (see interventions/notes)  Goal: Patient-Specific Goal (Individualized)  Outcome: Ongoing (see interventions/notes)  Flowsheets (Taken 02/15/2023 1510)  Individualized Care Needs: medication for MRI  Anxieties, Fears or Concerns: MRI  Patient-Specific Goals (Include Timeframe): to go home  Goal: Optimal Comfort and Wellbeing  Outcome: Ongoing (see interventions/notes)

## 2023-02-15 NOTE — ED Provider Notes (Signed)
[facility] - Emergency Department  ED Primary Provider Note  History of Present Illness   Chief Complaint   Patient presents with    Weakness     [facility] transfer. weakness and paraesthesias to both upper and lower extremities. Denies recent falls/injuries but stated he has been unsteady    Shortness of Breath     +sob. Spo2 86% in triage on RA. Does not wear O2 at baseline. Stated he required O2 while at [facility] ER.      RAGE BEEVER is a 57 y.o. male who had concerns including Weakness and Shortness of Breath.  Arrival: The patient arrived by Ambulance    Mansfield Dann is a 57 yo M who presents with bilateral upper and lower extremity paresthesias for 2 months. However, he has noticed that these symptoms have worsened over the past 2 weeks. Patient reports that paresthesia is worse on the feet and hands. This has impacted his ability to walk around contributing to weakness. He noticed that weakness is worst at the end of the day and also at the beginning of the day when he gets out of bed. Patient is a current tobacco smoker but does not use home oxygen. He has noticed worsening difficulty breathing but he believed this was due to his tobacco use. He also endorsed worsening shooting pain that radiates from the back of the head down to low back when moving his head or when chewing food. Denied any hematochezia, melena, rashes, or changes in urination. He went to a physician recently who gave him some steroids but was not helpful. Denied any difficulty with speech. Denied any changes in vision. 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 Endorsed redness of both eyes. Denied starting any new medications. Denied any recent surgeries or procedures.       Shortness of Breath    History Reviewed This Encounter: reviewed     Physical Exam   ED Triage Vitals [02/15/23 0726]   BP (Non-Invasive) (!) 176/102   Heart Rate (!) 104   Respiratory Rate 16   Temperature  36.9 C (98.4 F)   SpO2 (!) 86 %   Weight 124 kg (272 lb 11.3 oz)   Height 1.88 m (6\' 2" )     Physical Exam  Vitals and nursing note reviewed.   Constitutional:       General: He is not in acute distress.     Appearance: He is well-developed.   HENT:      Head: Normocephalic and atraumatic.   Eyes:      Conjunctiva/sclera: Conjunctivae normal.   Cardiovascular:      Rate and Rhythm: Normal rate and regular rhythm.      Heart sounds: No murmur heard.  Pulmonary:      Effort: No respiratory distress.      Breath sounds: Normal breath sounds.      Comments: On 3L oxygen.  Abdominal:      Palpations: Abdomen is soft.      Tenderness: There is no abdominal tenderness.   Musculoskeletal:         General: No swelling.      Cervical back: Neck supple.   Skin:     General: Skin is warm and dry.      Capillary Refill: Capillary refill takes less than 2 seconds.   Neurological:      Mental Status: He is alert.  Comments: Muscular strength: BUE 3/5, BLE 3/5   CN III - XII intact.   Psychiatric:         Mood and Affect: Mood normal.       Patient Data     Labs Ordered/Reviewed   BASIC METABOLIC PANEL - Abnormal; Notable for the following components:       Result Value    GLUCOSE 339 (*)     All other components within normal limits   CBC WITH DIFF - Abnormal; Notable for the following components:    HGB 18.0 (*)     HCT 55.1 (*)     MONOCYTE # <0.10 (*)     All other components within normal limits   MAGNESIUM - Abnormal; Notable for the following components:    MAGNESIUM 1.7 (*)     All other components within normal limits   BLOOD GAS W/ LACTATE REFLEX - Abnormal; Notable for the following components:    PCO2 (VENOUS) 58 (*)     BASE EXCESS 3.9 (*)     LACTATE 4.3 (*)     All other components within normal limits   PHOSPHORUS - Abnormal; Notable for the following components:    PHOSPHORUS 1.3 (*)     All other components within normal limits   URINALYSIS, MACROSCOPIC - Abnormal; Notable for the following components:     SPECIFIC GRAVITY 1.046 (*)     GLUCOSE >=1000 (*)     PROTEIN 30 (*)     All other components within normal limits   BLOOD GAS/CO-OX - Abnormal; Notable for the following components:    PH (VENOUS) 7.30 (*)     PCO2 (VENOUS) 62 (*)     HEMOGLOBIN 18.5 (*)     HEMATOCRITRT 56 (*)     CARBOXYHEMOGLOBIN 4.0 (*)     All other components within normal limits   BLOOD GAS - Abnormal; Notable for the following components:    PCO2 (VENOUS) 60 (*)     BASE EXCESS 4.0 (*)     All other components within normal limits   THYROID STIMULATING HORMONE (SENSITIVE TSH) - Normal   C-REACTIVE PROTEIN(CRP),INFLAMMATION - Normal   CREATINE KINASE (CK), TOTAL, SERUM OR PLASMA - Normal   HEPATIC FUNCTION PANEL - Normal   TROPONIN-I (FOR ED ONLY) - Normal   URINALYSIS, MICROSCOPIC - Normal   VITAMIN B12 - Normal   FOLATE - Normal   CBC/DIFF    Narrative:     The following orders were created for panel order CBC/DIFF.  Procedure                               Abnormality         Status                     ---------                               -----------         ------                     CBC WITH ZOXW[[id number]]                Abnormal            Final result  Please view results for these tests on the individual orders.   URINALYSIS, MACROSCOPIC AND MICROSCOPIC W/CULTURE REFLEX    Narrative:     The following orders were created for panel order URINALYSIS, MACROSCOPIC AND MICROSCOPIC W/CULTURE REFLEX.  Procedure                               Abnormality         Status                     ---------                               -----------         ------                     URINALYSIS, MACROSCOPIC[[id number]]      Abnormal            Final result               URINALYSIS, MICROSCOPIC[[id number]]      Normal              Final result                 Please view results for these tests on the individual orders.     XR CERVICAL SPINE AP AND LATERAL   Final Result by Edi, Radresults In (04/12 1243)   No acute fracture or traumatic  subluxation in the cervical spine within study limitations.      CT CHEST ABDOMEN PELVIS W IV CONTRAST   Final Result by Edi, Radresults In (04/12 1007)   1. No evidence of acute abnormality in the chest, abdomen, and pelvis.   2. Nonobstructing right-sided nephrolithiasis.        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 Patient is having worsening paresthesias of bilateral upper and lower extremities, shooting pain down lower back with moving head, weakness, and 3+ patellar hyperreflexia.     We ordered basic labs, inflammatory markers, and CT chest/abd/p to determine the etiology.     MRI c spine supportive of cervical stenosis. Consulted neurology and spine. Ordered repeat C spine and XR C spine per spine recommendations. Patient was given steroids for low O2 sats, which improved. O2 sats have been ranging from 89-91% on 4-6L oxygen. Admitted to medicine.     Patient has been hypoxic 88-91% O2 on 4-6L oxygen. NIF and FVC unremarkable. Does not use home oxygen.     Amount and/or Complexity of Data Reviewed  Labs: ordered.  Radiology: ordered.  ECG/medicine tests: ordered.    Risk  OTC drugs.  Prescription drug management.  Parenteral controlled substances.  Decision regarding hospitalization.      ED Course as of 02/26/23 0408   Fri Feb 15, 2023   0908 FVC 1.7     0944 MAGNESIUM(!): 1.7   0944 PHOSPHORUS(!): 1.3   0944 TROPONIN-I HS: <2.7   0946 HGB(!): 18.0   0946 HCT(!): 55.1   0946 LACTATE(!!): 4.3   1023 FOLATE: 17.0   1024 VITAMIN B12: 525         Medications Administered in the ED   ipratropium-albuterol 0.5 mg-3 mg(2.5 mg base)/3 mL Solution for Nebulization (3 mL Nebulization Given 02/15/23  1610)   albuterol (PROVENTIL) 2.5mg / 0.5 mL nebulizer solution (2.5 mg Nebulization Given 02/15/23 0831)     And   albuterol (PROVENTIL) 2.5 mg / 3 mL (0.083%) neb solution (2.5 mg Nebulization Given 02/15/23 0831)   iopamidol (ISOVUE-370)  76% infusion (100 mL Intravenous Given 02/15/23 0906)   magnesium sulfate 2 G in SW 50 mL premix IVPB (0 g Intravenous Stopped 02/15/23 1021)   nicotine (NICODERM CQ) transdermal patch (mg/24 hr) (has no administration in time range)   methylPREDNISolone sod succ (SOLU-medrol) 125 mg/2 mL injection (has no administration in time range)     Clinical Impression   Cervical stenosis of spinal canal       Disposition: Admitted

## 2023-02-15 NOTE — Ancillary Notes (Signed)
[facility] [employer]  MRI Technologist Note        MRI has been completed.        Eulis Foster, [facility] 02/15/2023, 21:00

## 2023-02-16 DIAGNOSIS — R9082 White matter disease, unspecified: Secondary | ICD-10-CM

## 2023-02-16 DIAGNOSIS — F17210 Nicotine dependence, cigarettes, uncomplicated: Secondary | ICD-10-CM

## 2023-02-16 DIAGNOSIS — R202 Paresthesia of skin: Secondary | ICD-10-CM

## 2023-02-16 LAB — BLOOD GAS W/ LACTATE REFLEX
%FIO2 (ARTERIAL): 40 %
BASE EXCESS (ARTERIAL): 6.7 mmol/L — ABNORMAL HIGH (ref 0.0–3.0)
BICARBONATE (ARTERIAL): 30 mmol/L — ABNORMAL HIGH (ref 21.0–28.0)
LACTATE: 1.6 mmol/L (ref <=1.9)
O2 SATURATION (ARTERIAL): 91.1 %
PAO2/FIO2 RATIO: 155
PCO2 (ARTERIAL): 55 mm/Hg — ABNORMAL HIGH (ref 35–45)
PH (ARTERIAL): 7.39 (ref 7.35–7.45)
PO2 (ARTERIAL): 62 mm/Hg — ABNORMAL LOW (ref 83–108)

## 2023-02-16 LAB — TYPE AND SCREEN
ABO/RH(D): O POS
ANTIBODY SCREEN: NEGATIVE

## 2023-02-16 LAB — MANUAL DIFF AND MORPHOLOGY-SYSMEX
BASOPHIL #: <0.1 10*3/uL (ref <=0.20)
BASOPHIL %: 0 %
EOSINOPHIL #: <0.1 10*3/uL (ref <=0.50)
EOSINOPHIL %: 0 %
LYMPHOCYTE #: 1.22 10*3/uL (ref 1.00–4.80)
LYMPHOCYTE %: 4 %
MONOCYTE #: 0.73 10*3/uL (ref 0.20–1.10)
MONOCYTE %: 3 %
NEUTROPHIL #: 22.45 10*3/uL — ABNORMAL HIGH (ref 1.50–7.70)
NEUTROPHIL %: 91 %
NEUTROPHIL BANDS %: 1 %
RBC MORPHOLOGY: NORMAL
REACTIVE LYMPHOCYTE %: 1 %

## 2023-02-16 LAB — CBC WITH DIFF
HCT: 51.8 % (ref 38.9–52.0)
HGB: 17.1 g/dL (ref 13.4–17.5)
MCH: 31.1 pg (ref 26.0–32.0)
MCHC: 33 g/dL (ref 31.0–35.5)
MCV: 94.4 fL (ref 78.0–100.0)
MPV: 9.5 fL (ref 8.7–12.5)
PLATELETS: 271 10*3/uL (ref 150–400)
RBC: 5.49 10*6/uL (ref 4.50–6.10)
RDW-CV: 13.2 % (ref 11.5–15.5)
WBC: 24.4 10*3/uL — ABNORMAL HIGH (ref 3.7–11.0)

## 2023-02-16 LAB — POC BLOOD GLUCOSE (RESULTS)
GLUCOSE, POC: 126 mg/dl — ABNORMAL HIGH (ref 70–105)
GLUCOSE, POC: 152 mg/dl — ABNORMAL HIGH (ref 70–105)
GLUCOSE, POC: 208 mg/dl — ABNORMAL HIGH (ref 70–105)
GLUCOSE, POC: 217 mg/dl — ABNORMAL HIGH (ref 70–105)

## 2023-02-16 LAB — BASIC METABOLIC PANEL
ANION GAP: 6 mmol/L (ref 4–13)
BUN/CREA RATIO: 19 (ref 6–22)
BUN: 14 mg/dL (ref 8–25)
CALCIUM: 8.8 mg/dL (ref 8.6–10.2)
CHLORIDE: 103 mmol/L (ref 96–111)
CO2 TOTAL: 28 mmol/L (ref 22–30)
CREATININE: 0.75 mg/dL (ref 0.75–1.35)
ESTIMATED GFR - MALE: >90 mL/min/BSA (ref >=60)
GLUCOSE: 140 mg/dL — ABNORMAL HIGH (ref 65–125)
POTASSIUM: 5.3 mmol/L — ABNORMAL HIGH (ref 3.5–5.1)
SODIUM: 137 mmol/L (ref 136–145)

## 2023-02-16 LAB — MAGNESIUM: MAGNESIUM: 2 mg/dL (ref 1.8–2.6)

## 2023-02-16 MED ORDER — GABAPENTIN 300 MG CAPSULE
300.0000 mg | ORAL_CAPSULE | Freq: Two times a day (BID) | ORAL | Status: DC
Start: 2023-02-16 — End: 2023-02-20
  Administered 2023-02-16 – 2023-02-20 (×9): 300 mg via ORAL
  Filled 2023-02-16 (×9): qty 1

## 2023-02-16 MED ORDER — NICOTINE 21 MG/24 HR DAILY TRANSDERMAL PATCH
21.0000 mg | MEDICATED_PATCH | Freq: Every day | TRANSDERMAL | Status: DC
Start: 2023-02-16 — End: 2023-02-20
  Administered 2023-02-16 – 2023-02-20 (×5): 21 mg via TRANSDERMAL
  Filled 2023-02-16 (×5): qty 1

## 2023-02-16 MED ORDER — GUAIFENESIN 100 MG/5 ML ORAL LIQUID
200.0000 mg | ORAL | Status: DC | PRN
Start: 2023-02-16 — End: 2023-02-20
  Administered 2023-02-16 – 2023-02-17 (×2): 200 mg via ORAL
  Filled 2023-02-16 (×2): qty 10

## 2023-02-16 NOTE — Nurses Notes (Signed)
Dr Luci Bank returned call and said since patient has history COPD O2 sat 88-89% on his [facility] is okay.  Will continue to monitor patient.

## 2023-02-16 NOTE — Care Plan (Signed)
Patient Alert Ox4, up ad lib, 6LNC 88-92% no distress noted.  Hx COPD, prn dilaudid and oxycodone given for c/0 pain to hands and feet.  MRI completed overnight.  Denies additional needs at this time.  Problem: Gas Exchange Impaired  Goal: Optimal Gas Exchange  02/16/2023 0405 by Zara Council, RN  Outcome: Ongoing (see interventions/notes)  02/16/2023 0404 by Zara Council, RN  Outcome: Ongoing (see interventions/notes)     Problem: Infection  Goal: Absence of Infection Signs and Symptoms  02/16/2023 0405 by Zara Council, RN  Outcome: Ongoing (see interventions/notes)  02/16/2023 0404 by Zara Council, RN  Outcome: Ongoing (see interventions/notes)  Intervention: Prevent or Manage Infection  Recent Flowsheet Documentation  Taken 02/15/2023 2000 by Zara Council, RN  Fever Reduction/Comfort Measures:   lightweight bedding   lightweight clothing     Problem: Pain Acute  Goal: Optimal Pain Control and Function  Outcome: Ongoing (see interventions/notes)  Intervention: Optimize Psychosocial Wellbeing  Recent Flowsheet Documentation  Taken 02/15/2023 2000 by Zara Council, RN  Diversional Activities: television  Supportive Measures: active listening utilized

## 2023-02-16 NOTE — Nurses Notes (Signed)
Patient O2 sat keeps dropping to 87-89% 6LNC while sleeping and then returning to 90-91%, no distess noted, pt denies SOB.  Resp therapist called to bedside to evaluate patient--they too note no distress.  Dr Luci Bank text paged and made aware.

## 2023-02-16 NOTE — Progress Notes (Signed)
[facility]  Medicine Progress Note    [name] [name]  Date of service: 02/16/2023  Date of Admission:  02/15/2023    Hospital Day:  LOS: 1 day       Assessment/ Plan:   Active Hospital Problems    Diagnosis    Primary Problem: Cervical stenosis of spine       [name] [name] is a 57 y.o., male with PMH significant for COPD, gout who presents with progressive weakness.     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 - Progressive BUE/BLE weakness, numbness, radiculopathy x 2 months, markedly worse x 1 week, MRI C spine with C3-4 severe canal narrowing;   - NSGY consulted, plan for anterior cervical diskectomy in the future however there are concerns over current respiratory symptoms  - Neuro consulted, signed off as no further recs  - continue PRN narcotics for pain control  - PT OT     Acute hypoxic respiratory failure - reported desats for about the last 2 months, unclear etiology, CT Chest without acute pathology, no wheezing on exam, TTE March 2024 without significant abnormality; PaO2 62 on ABG  - continue O2  - continue prednisone  daily x 4 days (5 days total)  - scheduled duonebs  - PRN albuterol  - consult pulm for further assistance     Gout - allopurinol     Chronic pain - 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 Patient ideally would like to get off Suboxone entirely     Obesity    Leukocytosis - presumably due to steroids. Monitor for now.     Body mass index is 34.93 kg/m.  Protein Calorie Malnutrition  -Patient meets criteria based on the following clinical characteristics:     -Nutrition consulted     -MNT protocol ordered  -monitor bowel functions  Lab Results   Component Value Date    ALBUMIN 3.8 02/15/2023        Hardware (lines, foley's, tubes):  Patient Lines/Drains/Airways Status       Active Line / Dialysis Catheter / Dialysis Graft / Drain / Airway / Wound        Name Placement date Placement time Site Days    Peripheral IV Left Median Cubital  (antecubital fossa) 02/15/23  0846  -- 1                     DVT/PE Prophylaxis: lovenox    Disposition Planning: TBD      Murtis Sink, MD  [facility]  Assistant Professor of Medicine  Pager [phone]    On 02/16/2023 I spent a total visit time of 51 minutes. Time included review of tests and ordering tests, obtaining/reviewing history, examining the patient, communicating with consultants, documenting clinical information and counseling the patient and/or family regarding the diagnosis and management plan and coordination of care involved services directly related to patient care.    FOLLOW UP NOTE LEVEL 3 (TOTAL TIME > 50 MINUTES) (54098)    ____________________________________________________________________________________________________________________________________________________________________    Chief Complaint: resp failure, central cord syndrome  Subjective: NAEO. Still with pain in extremities, head, feet. States medications are generally effective. Still on 6L and states he has maintained O2 sats in the mid-low 80s for the last 2 months without specific interventions prior to admission here. States he was largely asymptomatic and attributed DOE/weakness to his neurologic  conditions.     Vital Signs:  Temp  Avg: 36.7 C (98 F)  Min: 36.5 C (97.7 F)  Max: 36.7 C (98.1 F)    Pulse  Avg: 84.2  Min: 64  Max: 102 BP  Min: 116/76  Max: 155/73   Resp  Avg: 18.5  Min: 16  Max: 20 SpO2  Avg: 89.8 %  Min: 88 %  Max: 92 %          Input/Output    Intake/Output Summary (Last 24 hours) at 02/16/2023 1116  Last data filed at 02/16/2023 0900  Gross per 24 hour   Intake 108 ml   Output 1025 ml   Net -917 ml    I/O last shift:  04/13 0700 - 04/13 1859  In: -   Out: 475 [Urine:475]   acetaminophen (TYLENOL) tablet, 650 mg, Oral, Q4H PRN  albuterol (PROVENTIL) 2.5 mg / 3 mL (0.083%) neb solution, 2.5 mg, Nebulization, Q4H  PRN  allopurinol (ZYLOPRIM) tablet, 100 mg, Oral, 2x/day  D5W 250 mL flush bag, , Intravenous, Q15 Min PRN  diazePAM (VALIUM) 5 mg/mL injection, 5 mg, Intravenous, Q30 Min PRN  enoxaparin PF (LOVENOX) 40 mg/0.4 mL SubQ injection, 40 mg, Subcutaneous, Daily  gabapentin (NEURONTIN) capsule, 300 mg, Oral, 2x/day  HYDROmorphone (DILAUDID) 0.5 mg/0.5 mL injection, 0.5 mg, Intravenous, Q3H PRN  ipratropium-albuterol 0.5 mg-3 mg(2.5 mg base)/3 mL Solution for Nebulization, 3 mL, Nebulization, 4x/day  nicotine (NICODERM CQ) transdermal patch (mg/24 hr), 21 mg, Transdermal, Now  NS 250 mL flush bag, , Intravenous, Q15 Min PRN  NS flush syringe, 2-6 mL, Intracatheter, Q8HRS  NS flush syringe, 2-6 mL, Intracatheter, Q1 MIN PRN  ondansetron (ZOFRAN) 2 mg/mL injection, 4 mg, Intravenous, Q6H PRN  oxyCODONE (ROXICODONE) immediate release tablet, 5 mg, Oral, Q4H PRN   Or  oxyCODONE (ROXICODONE) immediate release tablet, 10 mg, Oral, Q4H PRN  polyethylene glycol (MIRALAX) oral packet, 17 g, Oral, Daily  predniSONE (DELTASONE) tablet, 40 mg, Oral, Daily  SSIP insulin lispro (HumaLOG) 100 units/mL injection, 0-12 Units, Subcutaneous, 4x/day PRN          Physical Exam  BP (!) 141/84   Pulse 64   Temp 36.5 C (97.7 F)   Resp 20   Ht 1.88 m (6\' 2" )   Wt 123 kg (272 lb 0.8 oz)   SpO2 (!) 88%   BMI 34.93 kg/m     Constitutional: Alert and oriented x 3, NAD, appears age appropriate  Respiratory: Clear to auscultation bilaterally. No wheeze or rales.   Cardiovascular: regular rate and rhythm, S1, S2 normal, no murmur, click, rub or gallop  Gastrointestinal: Soft, non-tender, Bowel sounds normal, non-distended  Musculoskeletal: Head atraumatic and normocephalic. No significant edema.   Integumentary:  Skin warm and dry, No rashes and No lesions  Neurologic: decreased grip strength, diminished sensation in BUE/BLE (worse in L than R). No tremor  Psychiatric: Normal affect, behavior    Labs:    CBC  Diff   Lab Results   Component  Value Date/Time    WBC 24.4 (H) 02/16/2023 05:19 AM    HGB 17.1 02/16/2023 05:19 AM    HCT 51.8 02/16/2023 05:19 AM    PLTCNT 271 02/16/2023 05:19 AM    RBC 5.49 02/16/2023 05:19 AM    MCV 94.4 02/16/2023 05:19 AM    MCHC 33.0 02/16/2023 05:19 AM    MCH 31.1 02/16/2023 05:19 AM    RDW 14.1 02/14/2023 05:32 PM    MPV 9.5 02/16/2023  05:19 AM    Lab Results   Component Value Date/Time    PMNS 91 02/16/2023 05:19 AM    LYMPHOCYTES 4 02/16/2023 05:19 AM    EOSINOPHIL 0 02/16/2023 05:19 AM    MONOCYTES 3 02/16/2023 05:19 AM    BASOPHILS <0.10 02/16/2023 05:19 AM    BASOPHILS 0 02/16/2023 05:19 AM    PMNABS 22.45 (H) 02/16/2023 05:19 AM    LYMPHSABS 1.19 02/15/2023 08:46 AM    EOSABS <0.10 02/16/2023 05:19 AM    MONOSABS 0.73 02/16/2023 05:19 AM    BASABS 0.09 06/11/2022 12:22 PM          Comprehensive Metabolic Profile    Lab Results   Component Value Date/Time    SODIUM 137 02/16/2023 05:19 AM    POTASSIUM 5.3 (H) 02/16/2023 05:19 AM    CHLORIDE 103 02/16/2023 05:19 AM    CO2 28 02/16/2023 05:19 AM    ANIONGAP 6 02/16/2023 05:19 AM    BUN 14 02/16/2023 05:19 AM    CREATININE 0.75 02/16/2023 05:19 AM    GLUCOSE 140 (H) 02/16/2023 05:19 AM    Lab Results   Component Value Date/Time    CALCIUM 8.8 02/16/2023 05:19 AM    PHOSPHORUS 1.3 (L) 02/15/2023 08:46 AM    ALBUMIN 3.8 02/15/2023 08:46 AM    TOTALPROTEIN 7.7 02/15/2023 08:46 AM    ALKPHOS 88 02/15/2023 08:46 AM    AST 23 02/15/2023 08:46 AM    ALT 23 02/15/2023 08:46 AM    TOTBILIRUBIN 0.4 02/15/2023 08:46 AM        CARDIAC MARKERS  Lab Results   Component Value Date    CPK 97 02/15/2023            Radiology:      Results for orders placed during the hospital encounter of 02/15/23    CT CHEST ABDOMEN PELVIS W IV CONTRAST 02/15/2023 10:07 AM    Impression  1. No evidence of acute abnormality in the chest, abdomen, and pelvis.  2. Nonobstructing right-sided nephrolithiasis.

## 2023-02-16 NOTE — Nurses Notes (Signed)
Pt sitting up his chair. Pt wants to breathing treatment at this time. Notified respiratory therapist. He is in here and give him treatment.

## 2023-02-17 DIAGNOSIS — S14129A Central cord syndrome at unspecified level of cervical spinal cord, initial encounter: Secondary | ICD-10-CM

## 2023-02-17 DIAGNOSIS — J9611 Chronic respiratory failure with hypoxia: Secondary | ICD-10-CM

## 2023-02-17 DIAGNOSIS — R531 Weakness: Secondary | ICD-10-CM

## 2023-02-17 DIAGNOSIS — D72829 Elevated white blood cell count, unspecified: Secondary | ICD-10-CM

## 2023-02-17 LAB — CBC WITH DIFF
BASOPHIL #: <0.1 10*3/uL (ref <=0.20)
BASOPHIL %: 0.2 %
EOSINOPHIL #: <0.1 10*3/uL (ref <=0.50)
EOSINOPHIL %: 0.1 %
HCT: 51.6 % (ref 38.9–52.0)
HGB: 16.9 g/dL (ref 13.4–17.5)
IMMATURE GRANULOCYTE #: 0.1 10*3/uL — ABNORMAL HIGH (ref <0.10)
IMMATURE GRANULOCYTE %: 0.5 % (ref 0.0–1.0)
LYMPHOCYTE #: 3.15 10*3/uL (ref 1.00–4.80)
LYMPHOCYTE %: 17 %
MCH: 31.5 pg (ref 26.0–32.0)
MCHC: 32.8 g/dL (ref 31.0–35.5)
MCV: 96.3 fL (ref 78.0–100.0)
MONOCYTE #: 1.38 10*3/uL — ABNORMAL HIGH (ref 0.20–1.10)
MONOCYTE %: 7.5 %
MPV: 9.9 fL (ref 8.7–12.5)
NEUTROPHIL #: 13.82 10*3/uL — ABNORMAL HIGH (ref 1.50–7.70)
NEUTROPHIL %: 74.7 %
PLATELETS: 239 10*3/uL (ref 150–400)
RBC: 5.36 10*6/uL (ref 4.50–6.10)
RDW-CV: 13.6 % (ref 11.5–15.5)
WBC: 18.5 10*3/uL — ABNORMAL HIGH (ref 3.7–11.0)

## 2023-02-17 LAB — BASIC METABOLIC PANEL
ANION GAP: 5 mmol/L (ref 4–13)
BUN/CREA RATIO: 28 — ABNORMAL HIGH (ref 6–22)
BUN: 21 mg/dL (ref 8–25)
CALCIUM: 9.7 mg/dL (ref 8.6–10.2)
CHLORIDE: 100 mmol/L (ref 96–111)
CO2 TOTAL: 32 mmol/L — ABNORMAL HIGH (ref 22–30)
CREATININE: 0.76 mg/dL (ref 0.75–1.35)
ESTIMATED GFR - MALE: >90 mL/min/BSA (ref >=60)
GLUCOSE: 104 mg/dL (ref 65–125)
POTASSIUM: 4.5 mmol/L (ref 3.5–5.1)
SODIUM: 137 mmol/L (ref 136–145)

## 2023-02-17 LAB — PHOSPHORUS: PHOSPHORUS: 3.9 mg/dL (ref 2.4–4.7)

## 2023-02-17 LAB — B-TYPE NATRIURETIC PEPTIDE (BNP),PLASMA: BNP: 14 pg/mL (ref <=100)

## 2023-02-17 LAB — POC BLOOD GLUCOSE (RESULTS)
GLUCOSE, POC: 100 mg/dl (ref 70–105)
GLUCOSE, POC: 196 mg/dl — ABNORMAL HIGH (ref 70–105)
GLUCOSE, POC: 110 mg/dl — ABNORMAL HIGH (ref 70–105)
GLUCOSE, POC: 189 mg/dl — ABNORMAL HIGH (ref 70–105)

## 2023-02-17 LAB — MAGNESIUM: MAGNESIUM: 1.9 mg/dL (ref 1.8–2.6)

## 2023-02-17 LAB — ABO & RH: ABO/RH(D): O POS

## 2023-02-17 MED ORDER — DEXTROSE 5 % IN WATER (D5W) INTRAVENOUS SOLUTION
3.0000 g | Freq: Once | INTRAVENOUS | Status: AC
Start: 2023-02-18 — End: 2023-02-18
  Administered 2023-02-18: 3 g via INTRAVENOUS
  Filled 2023-02-17: qty 15

## 2023-02-17 MED ORDER — SODIUM CHLORIDE 0.9 % INTRAVENOUS SOLUTION
INTRAVENOUS | Status: DC
Start: 2023-02-18 — End: 2023-02-18
  Administered 2023-02-18: 0 mL via INTRAVENOUS

## 2023-02-17 NOTE — Care Plan (Signed)
[facility]  Rehabilitation Services  Occupational Therapy Initial Evaluation    Patient Name: [name] [name]  Date of Birth: November 16, 1965  Height: Height: 188 cm (6\' 2" )  Weight: Weight: 123 kg (272 lb 0.8 oz)  Room/Bed: 03/A  Payor: [employer] / Plan: [employer] / Product Type: Medicaid [facility] /     Assessment:   [name] [name] tolerated OT eval fairly well on this date. Pt demonstrates limited endurance, strength, ROM, functional activity tolerance and fxnl mobility from baseline 2/2 increased fatigue and decreasaed coordination at this time. However, anticipate pt will progress well w/ therapy and be able to return home w/ assist and [facility] once medically ready for d/c.      Discharge Needs:   Equipment Recommendation: (P) to be determined        Discharge Disposition: home with 24/7 assistance, home with home health    JUSTIFICATION OF DISCHARGE RECOMMENDATION   Based on current diagnosis, functional performance prior to admission, and current functional performance, this patient requires continued OT services in home with 24/7 assistance, home with home health  in order to achieve significant functional improvements.    Plan:   Current Intervention: ADL retraining, balance training, bed mobility training, endurance training, strengthening, therapeutic exercise, transfer training    To provide Occupational therapy services (P) 1x/day, minimum of 2x/week, until discharge.       The risks/benefits of therapy have been discussed with the patient/caregiver and he/she is in agreement with the established plan of care.       Subjective & Objective        02/17/23 1131   Therapist Pager   OT Assigned/ Pager # Natasha Mead [phone]   Rehab Session   Document Type evaluation   Total OT Minutes: 9   Patient Effort adequate   Symptoms Noted During/After Treatment fatigue;shortness of breath   Symptoms Noted Comment reports SOB w/ exertion 2/2 baseline COPD   General Information   Patient Profile Reviewed yes   Onset  of Illness/Injury or Date of Surgery 02/15/23   General Observations of Patient Pt sitting in chair upon arrival, pleasant and cooperative. Agreeable to therapy at this time   Pertinent History of Current Functional Problem [name] [name] is a 57 y.o., male with PMH significant for COPD, gout who presents with progressive weakness.     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 - Progressive BUE/BLE weakness, numbness, radiculopathy x 2 months, markedly worse x 1 week, MRI C spine with C3-4 severe canal narrowing;   Medical Lines PIV Line;Telemetry   Respiratory Status nasal cannula  (6L)   Existing Precautions/Restrictions full code;fall precautions;oxygen therapy device and L/min   Pre Treatment Status   Pre Treatment Patient Status Patient sitting in bedside chair or w/c;Call light within reach;Telephone within reach;Patient safety alarm activated;Nurse approved session   Support Present Pre Treatment  None   Communication Pre Treatment  Nurse   Communication Pre Treatment Comment RN approved session   Mutuality/Individual Preferences   Individualized Care Needs OOB Ax1   Patient-Specific Goals (Include Timeframe) get better, get home   Plan of Care Reviewed With patient   Living Environment   Lives With significant other   Living Arrangements house   Home Accessibility stairs to enter home   Transportation Available family or friend will provide   Living Environment Comment pt reports few STE, did not specify. pt also reports switching back and forth between his house and fiances.  Pt reports his fiance helps him prn.   Home Main Entrance   Number of Stairs, Main Entrance three;four   Functional Level Prior   Ambulation 0 - independent   Transferring 0 - independent   Toileting 0 - independent   Bathing 0 - independent   Dressing 0 - independent   Eating 0 - independent   Communication 0 - understands/communicates without difficulty   Prior Functional Level Comment pt reports typically being  ind w/ ADLs/IADL; however reports recent difficulty w/ ADLs, fine motor skills, grip/pinch, etc. Pt reports his fiance helps him prn.   Self-Care   Dominant Hand right   Usual Activity Tolerance good   Current Activity Tolerance moderate   Equipment Currently Used at Home no   Vital Signs   Pre SpO2 (%) 89   O2 Delivery Pre Treatment supplemental O2   Post SpO2 (%) 90   O2 Delivery Post Treatment supplemental O2   Vitals Comment no s/s of distress noted on 6L baseline   Pain Assessment   Pre/Posttreatment Pain Comment no c/o pain   Coping/Psychosocial   Observed Emotional State cooperative   Coping/Psychosocial Response Interventions   Plan Of Care Reviewed With patient   Trust Relationship/Rapport care explained   Cognitive Assessment/Interventions   Behavior/Mood Observations alert;cooperative   Orientation Status oriented x 4   Attention WNL/WFL   Follows Commands WNL   RUE Assessment   RUE Assessment X- Exceptions   RUE ROM grossly 3/4 range   RUE Strength grossly 4-/5   LUE Assessment   LUE Assessment X-Exceptions   LUE ROM grossly 3/4 range   LUE Strength grossly 4-/5   Grip Strength   Grip Left (4-/5) good minus, left   Right Grip (4-/5) good minus, right   Mobility Assessment/Training   Mobility Comment pt completed fxnl mobility ~25' w/ SBS and CGA   Bed Mobility Assessment/Treatment   Supine-Sit Independence not tested   Sit to Supine, Independence not tested   Comment OOB in chair pre/post   Transfer Assessment/Treatment   Sit-Stand Independence contact guard assist   Stand-Sit Independence contact guard assist   Sit-Stand-Sit, Assist Device handheld assist   Toilet Transfer Assist Device none   Transfer Safety Issues sequencing ability decreased;balance decreased during turns;step length decreased   Transfer Impairments balance impaired;endurance;coordination impaired;flexibility decreased;postural control impaired;ROM decreased;strength decreased   ADL Assessment/Intervention   ADL Comments pt decline  ADLs this date, reports been up to bathroom w/o difficulties   Balance Skill Training   Comment CGA and SBS   Sitting Balance: Static fair + balance   Sitting, Dynamic (Balance) fair + balance   Sit-to-Stand Balance fair balance   Standing Balance: Static fair balance   Standing Balance: Dynamic fair - balance   Systems Impairment Contributing to Balance Disturbance musculoskeletal;neuromuscular   Identified Impairments Contributing to Balance Disturbance impaired coordination;impaired motor control;impaired postural control;decreased ROM;decreased strength   Functional Endurance Training   Comment, Functional Endurance fair   Post Treatment Status   Post Treatment Patient Status Patient sitting in bedside chair or w/c;Call light within reach;Telephone within reach;Patient safety alarm activated   Support Present Post Treatment  None   Communication Post Treatement Nurse   Communication Post Treatment Comment pt performance   Care Plan Goals   OT Rehab Goals Bed Mobility Goal;LB Dressing Goal;Grooming Goal;Toileting Goal;Transfer Training Goal 2   Bed Mobility Goal   Bed Mobility Goal, Date Established 02/17/23   Bed Mobility Goal, Time to Achieve by discharge  Bed Mobility Goal, Activity Type all bed mobility activities   Bed Mobility Goal, Independence Level modified independence   Grooming Goal   Grooming Goal, Date Established 02/17/23   Grooming Goal, Time to Achieve by discharge   Grooming Goal, Activity Type all grooming tasks   Grooming Goal, Independence  stand-by assistance   Grooming Goal, Position standing   LB Dressing Goal   LB Dressing Goal, Date Established 02/17/23   LB Dressing Goal, Time to Achieve by discharge   LB Dressing Goal, Activity Type all lower body dressing tasks   LB Dressing Goal, Independence Level stand-by assistance   LB Dressing Goal, Adaptive Equipment shoe horn, long handled;sock-aid;reacher   Toileting Goal   Toileting Goal, Date Established 02/17/23   Toileting Goal, Time to  Achieve by discharge   Toileting Goal, Activity Type all toileting tasks   Toileting Goal, Independence Level stand-by assistance   Toileting Goal, Assist Device grab bar;toilet seat, raised   Transfer Training Goal 2   Transfer Training Goal, Date Established 02/17/23   Transfer Training Goal, Time to Achieve by discharge   Transfer Training Goal, Activity Type all transfers   Transfer Training Goal, Independence Level modified independence   Transfer Training Goal, Assist Device other (see comments)  (LRAD)   Planned Therapy Interventions, OT Eval   Planned Therapy Interventions ADL retraining;balance training;bed mobility training;endurance training;strengthening;therapeutic exercise;transfer training   Clinical Impression   Functional Level at Time of Session [name] [name] tolerated OT eval fairly well on this date. Pt demonstrates limited endurance, strength, ROM, functional activity tolerance and fxnl mobility from baseline 2/2 increased fatigue and decreasaed coordination at this time. However, anticipate pt will progress well w/ therapy and be able to return home w/ assist and [facility] once medically ready for d/c.   Criteria for Skilled Therapeutic Interventions Met (OT) yes;skilled treatment is necessary   Rehab Potential good   Therapy Frequency 1x/day;minimum of 2x/week   Predicted Duration of Therapy until discharge   Anticipated Equipment Needs at Discharge to be determined   Anticipated Discharge Disposition home with 24/7 assistance;home with home health   Highest level of Mobility score   Exercise/Activity Level Performed 7- Walked 25 feet or more   Evaluation Complexity Justification   Occupational Profile Review Expanded review   Performance Deficits 3-5 deficits;5+ deficits;Strength;Range of motion;Coordination;Endurance;Balance;Mobility   Clinical Decision Making Moderate analytic complexity   Evaluation Complexity Moderate       Therapist:   Einar Gip, OT   Pager #: [phone]

## 2023-02-17 NOTE — Nurses Notes (Signed)
Pt resting in his bed. Pt complains of neck and headache. Given schedule med with PRN Po oxy 10 mg.

## 2023-02-17 NOTE — Progress Notes (Addendum)
[facility]  Medicine Progress Note    [name]  Date of service: 02/17/2023  Date of Admission:  02/15/2023    Hospital Day:  LOS: 2 days       Assessment/ Plan:   Active Hospital Problems    Diagnosis    Primary Problem: Cervical stenosis of spine       [name] [name] is a 57 y.o., male with PMH significant for COPD, gout who presents with progressive weakness.     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 NPO at 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 Patient ideally would like to get off Suboxone entirely     Obesity    Leukocytosis - presumably due to steroids. Monitor for now.     Body mass index is 34.93 kg/m.  Protein Calorie Malnutrition  -Patient meets criteria based on the following clinical characteristics:     -Nutrition consulted     -MNT protocol ordered  -monitor bowel functions  Lab Results   Component Value Date    ALBUMIN 3.8 02/15/2023        Hardware (lines, foley's, tubes):  Patient Lines/Drains/Airways Status       Active Line / Dialysis Catheter / Dialysis Graft / Drain / Airway / Wound       Name Placement date Placement time Site Days     Peripheral IV Left Median Cubital  (antecubital fossa) 02/15/23  0846  -- 2                     DVT/PE Prophylaxis: lovenox    Disposition Planning: TBD      Murtis Sink, MD  [facility]  Assistant Professor of Medicine  Pager [phone]    On 02/17/2023 I spent a total visit time of 52 minutes. Time included review of tests and ordering tests, obtaining/reviewing history, examining the patient, communicating with consultants, documenting clinical information and counseling the patient and/or family regarding the diagnosis and management plan and coordination of care involved services directly related to patient care.    FOLLOW UP NOTE LEVEL 3 (TOTAL TIME > 50 MINUTES) ([id number])    ____________________________________________________________________________________________________________________________________________________________________    Chief Complaint: resp failure, central cord syndrome  Subjective: NAEO. Pain reasonably controlled. States he has been utilizing respiratory devices to try and improve his O2 sats on the monitor. Denied cp, sob.     Vital Signs:  Temp  Avg: 36.7 C (98.1 F)  Min: 36.4 C (97.5 F)  Max: 36.9 C (98.4 F)    Pulse  Avg: 79.1  Min: 72  Max: 86 BP  Min: 122/89  Max: 153/78   Resp  Avg: 16.2  Min: 12  Max: 20 SpO2  Avg: 91.4 %  Min: 89 %  Max: 94 %          Input/Output    Intake/Output Summary (Last 24 hours) at 02/17/2023 1100  Last data filed at 02/17/2023 1000  Gross per 24 hour   Intake 1320 ml   Output 2625 ml   Net -1305 ml    I/O last shift:  04/14 0700 - 04/14 1859  In: 480 [P.O.:480]  Out: 600 [Urine:600]   acetaminophen (TYLENOL) tablet, 650 mg, Oral, Q4H PRN  albuterol (PROVENTIL) 2.5 mg / 3 mL (0.083%) neb solution, 2.5 mg, Nebulization, Q4H PRN  allopurinol (ZYLOPRIM) tablet, 100 mg, Oral, 2x/day  D5W 250 mL flush bag, , Intravenous, Q15 Min PRN  diazePAM (VALIUM) 5 mg/mL injection, 5 mg, Intravenous, Q30 Min PRN  [Held by provider] enoxaparin PF (LOVENOX) 40  mg/0.4 mL SubQ injection, 40 mg, Subcutaneous, Daily  gabapentin (NEURONTIN) capsule, 300 mg, Oral, 2x/day  guaiFENesin  per 5mL oral liquid - for cough (expectorant), 200 mg, Oral, Q4H PRN  HYDROmorphone (DILAUDID) 0.5 mg/0.5 mL injection, 0.5 mg, Intravenous, Q3H PRN  ipratropium-albuterol 0.5 mg-3 mg(2.5 mg base)/3 mL Solution for Nebulization, 3 mL, Nebulization, 4x/day  nicotine (NICODERM CQ) transdermal patch (mg/24 hr), 21 mg, Transdermal, Daily  NS 250 mL flush bag, , Intravenous, Q15 Min PRN  NS flush syringe, 2-6 mL, Intracatheter, Q8HRS  NS flush syringe, 2-6 mL, Intracatheter, Q1 MIN PRN  [START ON 02/18/2023] NS premix infusion, , Intravenous, Continuous  ondansetron (ZOFRAN) 2 mg/mL injection, 4 mg, Intravenous, Q6H PRN  oxyCODONE (ROXICODONE) immediate release tablet, 5 mg, Oral, Q4H PRN   Or  oxyCODONE (ROXICODONE) immediate release tablet, 10 mg, Oral, Q4H PRN  polyethylene glycol (MIRALAX) oral packet, 17 g, Oral, Daily  predniSONE (DELTASONE) tablet, 40 mg, Oral, Daily  SSIP insulin lispro (HumaLOG) 100 units/mL injection, 0-12 Units, Subcutaneous, 4x/day PRN          Physical Exam  BP 128/77   Pulse 83   Temp 36.4 C (97.5 F)   Resp 15   Ht 1.88 m ( )   Wt 123 kg (272 lb 0.8 oz)   SpO2 91%   BMI 34.93 kg/m     Constitutional: Alert and oriented x 3, NAD, appears age appropriate  Respiratory: diminished but CTAB. No wheeze or rales.   Cardiovascular: rrr, S1, S2 normal, no murmur, click, rub or gallop  Gastrointestinal: Soft, NT, Bowel sounds normal, non-distended  Musculoskeletal: Head atraumatic and normocephalic. No significant edema.   Integumentary:  Skin warm and dry, No rashes and No lesions  Neurologic: decreased grip strength, diminished sensation in BUE/BLE (worse in L than R). No tremor  Psychiatric: Normal affect, behavior    Labs:    CBC  Diff   Lab Results   Component Value Date/Time    WBC 18.5 (H) 02/17/2023 03:48 AM    HGB 16.9 02/17/2023 03:48 AM    HCT 51.6  02/17/2023 03:48 AM    PLTCNT 239 02/17/2023 03:48 AM    RBC 5.36 02/17/2023 03:48 AM    MCV 96.3 02/17/2023 03:48 AM    MCHC 32.8 02/17/2023 03:48 AM    MCH 31.5 02/17/2023 03:48 AM    RDW 14.1 02/14/2023 05:32 PM    MPV 9.9 02/17/2023 03:48 AM    Lab Results   Component Value Date/Time  PMNS 74.7 02/17/2023 03:48 AM    LYMPHOCYTES 4 02/16/2023 05:19 AM    EOSINOPHIL 0 02/16/2023 05:19 AM    MONOCYTES 7.5 02/17/2023 03:48 AM    BASOPHILS 0.2 02/17/2023 03:48 AM    BASOPHILS <0.10 02/17/2023 03:48 AM    PMNABS 13.82 (H) 02/17/2023 03:48 AM    LYMPHSABS 3.15 02/17/2023 03:48 AM    EOSABS <0.10 02/17/2023 03:48 AM    MONOSABS 1.38 (H) 02/17/2023 03:48 AM    BASABS 0.09 06/11/2022 12:22 PM          Comprehensive Metabolic Profile    Lab Results   Component Value Date/Time    SODIUM 137 02/17/2023 03:48 AM    POTASSIUM 4.5 02/17/2023 03:48 AM    CHLORIDE 100 02/17/2023 03:48 AM    CO2 32 (H) 02/17/2023 03:48 AM    ANIONGAP 5 02/17/2023 03:48 AM    BUN 21 02/17/2023 03:48 AM    CREATININE 0.76 02/17/2023 03:48 AM    GLUCOSE 104 02/17/2023 03:48 AM    Lab Results   Component Value Date/Time    CALCIUM 9.7 02/17/2023 03:48 AM    PHOSPHORUS 3.9 02/17/2023 03:48 AM    ALBUMIN 3.8 02/15/2023 08:46 AM    TOTALPROTEIN 7.7 02/15/2023 08:46 AM    ALKPHOS 88 02/15/2023 08:46 AM    AST 23 02/15/2023 08:46 AM    ALT 23 02/15/2023 08:46 AM    TOTBILIRUBIN 0.4 02/15/2023 08:46 AM        CARDIAC MARKERS  Lab Results   Component Value Date    CPK 97 02/15/2023            Radiology:      Results for orders placed during the hospital encounter of 02/15/23    CT CHEST ABDOMEN PELVIS W IV CONTRAST 02/15/2023 10:07 AM    Impression  1. No evidence of acute abnormality in the chest, abdomen, and pelvis.  2. Nonobstructing right-sided nephrolithiasis.

## 2023-02-17 NOTE — Care Plan (Signed)
[facility]  Rehabilitation Services  Physical Therapy Initial Evaluation    Patient Name: [name] [name]  Date of Birth: [birth year]  Height: Height: 188 cm (6\' 2" )  Weight: Weight: 123 kg (272 lb 0.8 oz)  Room/Bed: 03/A  Payor: [employer] / Plan: [employer]ICA[employer] / Product Type: Medicaid [facility] /     Assessment:      [name] [name] tolerated PT evaluation fairly well on this date, demonstrates limited functional activity tolerance and transfers/ambulation ability from baseline 2/2 increased fatigue and decreasaed coordination at this time. However, anticipate pt will progress well with therapy and that pt will be able to return home with assist and HHPT once medically ready for d/c    Discharge Needs:    Equipment Recommendation: none anticipated (declines DME)  Discharge Disposition: home with assist, home with home health    JUSTIFICATION OF DISCHARGE RECOMMENDATION   Based on current diagnosis, functional performance prior to admission, and current functional performance, this patient requires continued PT services in home with assist, home with home health in order to achieve significant functional improvements in these deficit areas: aerobic capacity/endurance, gait, locomotion, and balance, motor function, muscle performance, neuromuscular, posture, ROM (range of motion).    Plan:   Current Intervention: balance training, bed mobility training, home exercise program, gait training, motor coordination training, neuromuscular re-education, patient/family education, postural re-education, stair training, ROM (range of motion), strengthening, stretching, transfer training  To provide physical therapy services 1x/day, minimum of 2x/week  for duration of until discharge.    The risks/benefits of therapy have been discussed with the patient/caregiver and he/she is in agreement with the established plan of care.       Subjective & Objective        02/17/23 1131   Therapist Pager   PT Assigned/ Pager  # [name] [name] [phone]   Rehab Session   Document Type evaluation   PT Visit Date 02/17/23   Total PT Minutes: 9   Patient Effort adequate   Symptoms Noted During/After Treatment fatigue;shortness of breath   Symptoms Noted Comment reports always SOB with exertion 2/2 baseline COPD   General Information   Patient Profile Reviewed yes   Onset of Illness/Injury or Date of Surgery 02/15/23   Pertinent History of Current Functional Problem [name] [name] is a 57 y.o., male with PMH significant for COPD, gout who presents with progressive weakness.     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 Agreeable to therapy at this time   Mutuality/Individual Preferences   Individualized Care Needs OOB CGA x1   Patient-Specific Goals (Include Timeframe) get better, get home   Plan of Care Reviewed With patient   Living Environment   Lives With significant other   Living Arrangements house   Home Accessibility stairs to enter home   Transportation Available family or friend will provide   Living Environment Comment reports few STE, did not specify   Home Main Entrance   Number of Stairs, Main Entrance three;four   Functional Level Prior   Ambulation 0 - independent   Transferring 0 - independent  Toileting 0 - independent   Bathing 0 - independent   Dressing 0 - independent   Eating 0 - independent   Communication 0 - understands/communicates without difficulty   Prior Functional Level Comment reports he is typically independent at his baseline; "does not try to use DME because I believe if you don't use it, you lose it"    Self-Care   Dominant Hand right   Usual Activity Tolerance good   Current Activity Tolerance moderate   Equipment Currently Used at Home no   Pre Treatment Status   Pre Treatment Patient Status Patient sitting in bedside chair or w/c;Call light within reach;Telephone within reach;Patient safety alarm activated;Nurse approved session   Support Present Pre Treatment  None   Communication Pre Treatment  Nurse   Communication Pre Treatment Comment RN approved PT and OT session   Cognitive Assessment/Interventions   Behavior/Mood Observations alert;cooperative   Orientation Status oriented x 4   Attention WNL/WFL   Follows Commands WNL   Vital Signs   O2 Delivery Pre Treatment supplemental O2   O2 Delivery Post Treatment supplemental O2   Vitals Comment no s/s of distress noted on 6L baseline   Pain Assessment   Pre/Posttreatment Pain Comment did not report pain at this time   RLE Assessment   RLE Assessment X-Exceptions   RLE ROM WFL   RLE Strength gross functional weakness noted   RLE Coordination decreased FM and GM   LLE Assessment   LLE Assessment X-Exceptions   LLE ROM WFL   LLE Strength gross functional weakness noted   LLE Coordination decreased FM and GM   Bed Mobility Assessment/Treatment   Supine-Sit Independence not tested   Sit to Supine, Independence not tested   Comment OOB to chair pre and post session   Transfer Assessment/Treatment   Sit-Stand Independence contact guard assist   Stand-Sit Independence contact guard assist   Sit-Stand-Sit, Assist Device handheld assist   Transfer Safety Issues sequencing ability decreased;balance decreased during turns;step length decreased   Transfer Impairments balance impaired;endurance;coordination impaired;flexibility decreased;postural control impaired;ROM decreased;strength decreased   Gait Assessment/Treatment   Total Distance Ambulated 25   Independence  contact guard assist   Assistive Device  side by side   Distance in Feet 25   Gait Speed decreased/slowed    Deviations  cadence decreased;narrow BOS;step length decreased   Safety Issues  step length decreased;sequencing ability decreased;balance decreased during turns   Impairments  balance impaired;coordination impaired;flexibility decreased;endurance;postural control impaired;ROM decreased;strength decreased   Balance Skill Training   Comment with CGA   Sitting Balance: Static fair + balance   Sitting, Dynamic (Balance) fair + balance   Sit-to-Stand Balance fair balance   Standing Balance: Static fair balance   Standing Balance: Dynamic fair - balance   Systems Impairment Contributing to Balance Disturbance musculoskeletal;neuromuscular   Identified Impairments Contributing to Balance Disturbance impaired coordination;impaired motor control;impaired postural control;decreased ROM;decreased strength   Functional Endurance Training   Comment, Functional Endurance fair   Post Treatment Status   Post Treatment Patient Status Patient sitting in bedside chair or w/c;Call light within reach;Telephone within reach;Patient safety alarm activated   Support Present Post Treatment  None   Communication Post Treatement Nurse   Communication Post Treatment Comment pt performance   Plan of Care Review   Plan Of Care Reviewed With patient   Basic Mobility Am-PAC/6Clicks Score (APPROVED Staff)   Turning in bed without bedrails 3   Lying on back to sitting on edge of flat  bed 3   Moving to and from a bed to a chair 3   Standing up from chair 3   Walk in room 3   Climbing 3-5 steps with railing 1   6 Clicks Raw Score total 16   Standardized (t-scale) score 38.32   Patient Mobility Goal (JHHLM) 5- Stand 1 minute or more 3X/day   Exercise/Activity Level Performed 7- Walked 25 feet or more   Functional Impairment   Overall Functional Impairments/Problem List balance impaired;coordination impaired;endurance;flexibility decreased;motor control impaired;postural control impaired;ROM decreased;strength decreased   Physical Therapy Clinical  Impression   Assessment Mr. Difalco tolerated PT evaluation fairly well on this date, demonstrates limited functional activity tolerance and transfers/ambulation ability from baseline 2/2 increased fatigue and decreasaed coordination at this time. However, anticipate pt will progress well with therapy and that pt will be able to return home with assist and HHPT once medically ready for d/c   Patient/Family Goals Statement get better, get home   Criteria for Skilled Therapeutic yes;meets criteria;skilled treatment is necessary   Pathology/Pathophysiology Noted musculoskeletal;neuromuscular   Impairments Found (describe specific impairments) aerobic capacity/endurance;gait, locomotion, and balance;motor function;muscle performance;neuromuscular;posture;ROM (range of motion)   Functional Limitations in Following  self-care;home management   Disability: Inability to Perform community/leisure   Rehab Potential good   Therapy Frequency 1x/day;minimum of 2x/week   Predicted Duration of Therapy Intervention (days/wks) until discharge   Anticipated Equipment Needs at Discharge (PT) none anticipated  (declines DME)   Anticipated Discharge Disposition home with assist;home with home health   Evaluation Complexity Justification   Patient History: Co-morbidity/factors that impact Plan of Care 3 or more that impact Plan of Care   Examination Components 4 or more Exam elements addressed   Presentation Evolving: Symptoms, complaints, characteristics of condition changing &/or cognitive deficits present   Clinical Decision Making Moderate complexity   Evaluation Complexity Moderate complexity   Care Plan Goals   PT Rehab Goals Bed Mobility Goal;Gait Training Goal;Transfer Training Goal;Stairs Training Goal   Bed Mobility Goal   Bed Mobility Goal, Date Established 02/17/23   Bed Mobility Goal, Time to Achieve by discharge   Bed Mobility Goal, Activity Type all bed mobility activities   Bed Mobility Goal, Independence Level modified  independence   Gait Training  Goal, Distance to Achieve   Gait Training  Goal, Date Established 02/17/23   Gait Training  Goal, Time to Achieve by discharge   Gait Training  Goal, Independence Level modified independence   Gait Training  Goal, Assist Device least restricted assistive device   Gait Training  Goal, Distance to Achieve 250 ft   Stairs Training Goal   Stairs Training Goal, Date Established 02/17/23   Stairs Training Goal, Time to Achieve by discharge   Stairs Training Goal, Independence Level modified independence   Stairs Training Goal, Assist Device least restrictive assistive device   Stairs Training Goal, Number of Stairs to Achieve 4   Transfer Training Goal   Transfer Training Goal, Date Established 02/17/23   Transfer Training Goal, Time to Achieve by discharge   Transfer Training Goal, Activity Type bed-to-chair/chair-to-bed;sit-to-stand/stand-to-sit   Transfer Training Goal, Independence Level modified independence   Transfer Training Goal, Assist Device least restrictrictive assistive device   Planned Therapy Interventions, PT Eval   Planned Therapy Interventions (PT) balance training;bed mobility training;home exercise program;gait training;motor coordination training;neuromuscular re-education;patient/family education;postural re-education;stair training;ROM (range of motion);strengthening;stretching;transfer training   Psychosocial Support   Trust Relationship/Rapport care explained       Therapist:  Willaim Bane, PT 11:31 02/17/23  Pager #: [phone]

## 2023-02-17 NOTE — Consults (Signed)
Pulmonary Medicine  Initial Inpatient Consult     Patient ID: [name] [name] male   Date of Birth: 1966-08-30  Date of Admission: 02/15/2023  Date of Service: 02/17/2023    Service: Hospitalist 3  Requesting MD: Murtis Sink MD    Information obtained from: patient  Chief complaint  Reason for consultation: oxygen requirement         Assessment:  Chronic hypoxic respiratory failure; likely 2/2 COPD. Chronicity evidence in his normal/elevated Hg.   COPD; diagnosed by PCP from smoking history and symptoms. No historic PFT's. ABG demonstrates compensated respiratory acidosis which supports this diagnosis. Does not appear to be in exacerbation.   Cervical spine stenosis; pre-operative risk assessment         Recommendations:  - Please place order for "PULMONARY CONSULT" in EHR, if not already done so  - ARISCAT score: 34pts, "moderate" risk of 13.3% for post-operative pulmonary complications.  - start Spiriva daily + Symbicort BID, continue at discharge  - OK to continue 5-day steroid course  - continue supplemental O2 for SpO2>88%, will likely require O2 at discharge, which can be coordinated w/ respiratory therapy team  - two week follow-up with pulmonary service outpatient after discharge.   - Recommend aggressive pulmonary toilet, OOB at least to chair TID, Ambulate as tolerated, Frequent Incentive Spirometry Q1H while awake with frequent reminders from nursing/respiratory therapy, and evaluation by PT/OT.     Thank you for this consult.  We will sign-off at this time..  If you have any questions or concerns, please page or call the pulmonary consult service.       History of Present Illness:   [name] [name] is a 57 y.o. male with COPD and progressive weakness.    [name] [name] reports that he has had low SpO2 levels measured at appointments for over a year. This reportedly resulted in a COPD diagnosis, however no provision of supplemental O2. He reports not having insurance for several years, which he says also  prevented him from obtaining COPD medications. He has been using an albuterol inhaler obtained from alternative sources, which he reports subjective improvement in SOB with. Currently he reports significant physical limitation from his extremity weakness, but not his breathing. Denies recent history of respiratory infections. Currently he denies SOB, cough, fever, chills, increased sputum production.      Past Medical Hx: COPD, gout  Past Surgical Hx: n/a   Social Hx:   - Tobacco: 1ppd since 57yo, quit for 64yr period but resumed smoking. 38pack-year history   - Occupation: Personnel officer   - Asbestos/Coal/Silica: minimal exposure to asbestos    - Travel: n/a    - Pets/birds: n/a  Family Hx: Remarkable for mother w/ COPD     Outpatient meds:  Medications Prior to Admission       Prescriptions    albuterol sulfate (PROVENTIL OR VENTOLIN OR PROAIR) 90 mcg/actuation Inhalation oral inhaler    Take 1-2 Puffs by inhalation Every 6 hours as needed    allopurinoL (ZYLOPRIM) 100 mg Oral Tablet    Take 1 Tablet (100 mg total) by mouth Twice daily            Allergies:  Allergies   Allergen Reactions    Naproxen Nausea/ Vomiting        Physical Examination  Temperature: 36.4 C (97.5 F)  Heart Rate: 83  BP (Non-Invasive): 128/77  Respiratory Rate: 15  SpO2: 91 %  Vitals:    BP 128/77   Pulse 83  Temp 36.4 C (97.5 F)   Resp 15   Ht 1.88 m (6\' 2" )   Wt 123 kg (272 lb 0.8 oz)   SpO2 91%   BMI 34.93 kg/m       General: alert, comfortable appearing  HEENT: moist mm, sclera anicteric   Neck: trachea midline  Pulmonary: mild expiratory wheezes, normal WOB  Extremities: mild digital clubbing and 1+ LE edema     Labs  Leukocytosis   ABG: 7.39/55/62      Imaging and other investigations  CT chest w/ contrast: very minor apical centrilobular emphysema     Gilford Raid, MD  Fellow, Pulmonary & Critical Care Medicine  [facility]           02/17/2023  I saw and examined the patient.  I reviewed the fellow's note.  I  agree with the findings and plan of care as documented in the fellow's note.  Any exceptions/additions are edited/noted.    Trixie Deis, MD

## 2023-02-18 ENCOUNTER — Encounter (HOSPITAL_COMMUNITY)
Admission: EM | Disposition: A | Discharge status: Home / Self Care | Payer: Self-pay | Source: Other Acute Inpatient Hospital | Attending: Internal Medicine

## 2023-02-18 ENCOUNTER — Inpatient Hospital Stay (HOSPITAL_COMMUNITY): Payer: MEDICAID

## 2023-02-18 ENCOUNTER — Other Ambulatory Visit (INDEPENDENT_AMBULATORY_CARE_PROVIDER_SITE_OTHER): Payer: Self-pay | Admitting: Family Medicine

## 2023-02-18 ENCOUNTER — Inpatient Hospital Stay (HOSPITAL_COMMUNITY): Payer: MEDICAID | Admitting: Certified Registered"

## 2023-02-18 ENCOUNTER — Encounter (HOSPITAL_COMMUNITY): Payer: Self-pay | Admitting: Internal Medicine

## 2023-02-18 DIAGNOSIS — Z87442 Personal history of urinary calculi: Secondary | ICD-10-CM

## 2023-02-18 DIAGNOSIS — N200 Calculus of kidney: Secondary | ICD-10-CM

## 2023-02-18 DIAGNOSIS — M4712 Other spondylosis with myelopathy, cervical region: Secondary | ICD-10-CM

## 2023-02-18 DIAGNOSIS — R531 Weakness: Secondary | ICD-10-CM

## 2023-02-18 DIAGNOSIS — M5412 Radiculopathy, cervical region: Secondary | ICD-10-CM

## 2023-02-18 DIAGNOSIS — M79646 Pain in unspecified finger(s): Secondary | ICD-10-CM

## 2023-02-18 DIAGNOSIS — M4802 Spinal stenosis, cervical region: Secondary | ICD-10-CM

## 2023-02-18 DIAGNOSIS — J449 Chronic obstructive pulmonary disease, unspecified: Secondary | ICD-10-CM

## 2023-02-18 HISTORY — PX: DISC REMOVAL: SHX762

## 2023-02-18 HISTORY — PX: HX CERVICAL SPINE SURGERY: 2100001197

## 2023-02-18 LAB — BLOOD GAS W/ CO-OX, LYTES, LACTATE REFLEX
%FIO2 (ARTERIAL): 45 %
%FIO2 (ARTERIAL): 68 %
BASE EXCESS (ARTERIAL): 6 mmol/L — ABNORMAL HIGH (ref 0.0–3.0)
BASE EXCESS (ARTERIAL): 5 mmol/L — ABNORMAL HIGH (ref 0.0–3.0)
BICARBONATE (ARTERIAL): 29.6 mmol/L — ABNORMAL HIGH (ref 21.0–28.0)
BICARBONATE (ARTERIAL): 28.8 mmol/L — ABNORMAL HIGH (ref 21.0–28.0)
CARBOXYHEMOGLOBIN: 1.4 % (ref <=3.0)
CARBOXYHEMOGLOBIN: 1.3 % (ref <=3.0)
CHLORIDE: 100 mmol/L (ref 98–107)
CHLORIDE: 101 mmol/L (ref 98–107)
GLUCOSE: 101 mg/dL (ref 65–125)
GLUCOSE: 118 mg/dL (ref 65–125)
HEMATOCRITRT: 50 % (ref 37–50)
HEMATOCRITRT: 50 % (ref 37–50)
HEMOGLOBIN: 16.5 g/dL (ref 12.0–18.0)
HEMOGLOBIN: 16.7 g/dL (ref 12.0–18.0)
IONIZED CALCIUM: 1.25 mmol/L (ref 1.15–1.33)
IONIZED CALCIUM: 1.2 mmol/L (ref 1.15–1.33)
LACTATE: 1.1 mmol/L (ref <=1.9)
LACTATE: 0.9 mmol/L (ref <=1.9)
MET-HEMOGLOBIN: 0.7 % (ref <=1.5)
MET-HEMOGLOBIN: 0.8 % (ref <=1.5)
O2CT: 23 %
O2CT: 23.2 %
OXYHEMOGLOBIN: 97.9 % — ABNORMAL HIGH (ref 90.0–95.0)
OXYHEMOGLOBIN: 97.7 % — ABNORMAL HIGH (ref 90.0–95.0)
PAO2/FIO2 RATIO: 369
PAO2/FIO2 RATIO: 244
PCO2 (ARTERIAL): 75 mm/Hg (ref 35–45)
PCO2 (ARTERIAL): 77 mm/Hg (ref 35–45)
PH (ARTERIAL): 7.29 — ABNORMAL LOW (ref 7.35–7.45)
PH (ARTERIAL): 7.27 — ABNORMAL LOW (ref 7.35–7.45)
PO2 (ARTERIAL): 166 mm/Hg — ABNORMAL HIGH (ref 83–108)
PO2 (ARTERIAL): 166 mm/Hg — ABNORMAL HIGH (ref 83–108)
SODIUM: 129 mmol/L — ABNORMAL LOW (ref 136–145)
SODIUM: 133 mmol/L — ABNORMAL LOW (ref 136–145)
WHOLE BLOOD POTASSIUM: 4.2 mmol/L (ref 3.5–5.1)
WHOLE BLOOD POTASSIUM: 4.3 mmol/L (ref 3.5–5.1)

## 2023-02-18 LAB — BLOOD GAS W/ LACTATE REFLEX
%FIO2 (ARTERIAL): 44 %
BASE DEFICIT: 11 mmol/L — ABNORMAL HIGH (ref 0.0–3.0)
BICARBONATE (ARTERIAL): 16.2 mmol/L — ABNORMAL LOW (ref 21.0–28.0)
LACTATE: 0.6 mmol/L (ref <=1.9)
O2 SATURATION (ARTERIAL): 91.2 %
PAO2/FIO2 RATIO: 164
PCO2 (ARTERIAL): 35 mm/Hg (ref 35–45)
PH (ARTERIAL): 7.25 — ABNORMAL LOW (ref 7.35–7.45)
PO2 (ARTERIAL): 72 mm/Hg — ABNORMAL LOW (ref 83–108)

## 2023-02-18 LAB — POC BLOOD GLUCOSE (RESULTS)
GLUCOSE, POC: 149 mg/dl — ABNORMAL HIGH (ref 70–105)
GLUCOSE, POC: 161 mg/dl — ABNORMAL HIGH (ref 70–105)
GLUCOSE, POC: 171 mg/dl — ABNORMAL HIGH (ref 70–105)

## 2023-02-18 LAB — BASIC METABOLIC PANEL
ANION GAP: 9 mmol/L (ref 4–13)
BUN/CREA RATIO: 23 — ABNORMAL HIGH (ref 6–22)
BUN: 19 mg/dL (ref 8–25)
CALCIUM: 9.6 mg/dL (ref 8.6–10.2)
CHLORIDE: 101 mmol/L (ref 96–111)
CO2 TOTAL: 31 mmol/L — ABNORMAL HIGH (ref 22–30)
CREATININE: 0.81 mg/dL (ref 0.75–1.35)
ESTIMATED GFR - MALE: >90 mL/min/BSA (ref >=60)
GLUCOSE: 130 mg/dL — ABNORMAL HIGH (ref 65–125)
POTASSIUM: 4.3 mmol/L (ref 3.5–5.1)
SODIUM: 141 mmol/L (ref 136–145)

## 2023-02-18 LAB — CBC WITH DIFF
BASOPHIL #: <0.1 10*3/uL (ref <=0.20)
BASOPHIL %: 0.3 %
EOSINOPHIL #: <0.1 10*3/uL (ref <=0.50)
EOSINOPHIL %: 0.2 %
HCT: 51.6 % (ref 38.9–52.0)
HGB: 16.5 g/dL (ref 13.4–17.5)
IMMATURE GRANULOCYTE #: <0.1 10*3/uL (ref <0.10)
IMMATURE GRANULOCYTE %: 0.5 % (ref 0.0–1.0)
LYMPHOCYTE #: 2.87 10*3/uL (ref 1.00–4.80)
LYMPHOCYTE %: 26.2 %
MCH: 31.4 pg (ref 26.0–32.0)
MCHC: 32 g/dL (ref 31.0–35.5)
MCV: 98.3 fL (ref 78.0–100.0)
MONOCYTE #: 1.41 10*3/uL — ABNORMAL HIGH (ref 0.20–1.10)
MONOCYTE %: 12.9 %
MPV: 9.4 fL (ref 8.7–12.5)
NEUTROPHIL #: 6.58 10*3/uL (ref 1.50–7.70)
NEUTROPHIL %: 59.9 %
PLATELETS: 213 10*3/uL (ref 150–400)
RBC: 5.25 10*6/uL (ref 4.50–6.10)
RDW-CV: 13.7 % (ref 11.5–15.5)
WBC: 11 10*3/uL (ref 3.7–11.0)

## 2023-02-18 LAB — MAGNESIUM: MAGNESIUM: 1.8 mg/dL (ref 1.8–2.6)

## 2023-02-18 SURGERY — DISCECTOMY SPINE CERVICAL ANTERIOR MICRO WITH FUSION AND INSTRUMENTATION 1 LEVEL
Anesthesia: General | Site: Spine Cervical | Wound class: Clean Wound: Uninfected operative wounds in which no inflammation occurred

## 2023-02-18 MED ORDER — FENTANYL (PF) 50 MCG/ML INJECTION SOLUTION
12.5000 ug | INTRAMUSCULAR | Status: DC | PRN
Start: 2023-02-18 — End: 2023-02-18

## 2023-02-18 MED ORDER — DEXAMETHASONE SODIUM PHOSPHATE 4 MG/ML INJECTION SOLUTION
Freq: Once | INTRAMUSCULAR | Status: DC | PRN
Start: 2023-02-18 — End: 2023-02-18
  Administered 2023-02-18: 8 mg via INTRAVENOUS

## 2023-02-18 MED ORDER — FENTANYL (PF) 50 MCG/ML INJECTION SOLUTION
INTRAMUSCULAR | Status: AC
Start: 2023-02-18 — End: 2023-02-18
  Filled 2023-02-18: qty 2

## 2023-02-18 MED ORDER — PROPOFOL 10 MG/ML IV BOLUS
INJECTION | Freq: Once | INTRAVENOUS | Status: DC | PRN
Start: 2023-02-18 — End: 2023-02-18
  Administered 2023-02-18: 250 mg via INTRAVENOUS

## 2023-02-18 MED ORDER — GLYCOPYRROLATE 0.2 MG/ML INJECTION SOLUTION
Freq: Once | INTRAMUSCULAR | Status: DC | PRN
Start: 2023-02-18 — End: 2023-02-18
  Administered 2023-02-18: .2 mg via INTRAVENOUS

## 2023-02-18 MED ORDER — REMIFENTANIL 100 MCG/ML INFUSION - FOR ANES
INTRAVENOUS | Status: DC | PRN
Start: 2023-02-18 — End: 2023-02-18
  Administered 2023-02-18: .1 ug/kg/min via INTRAVENOUS
  Administered 2023-02-18: 0 ug/kg/min via INTRAVENOUS
  Administered 2023-02-18: .05 ug/kg/min via INTRAVENOUS

## 2023-02-18 MED ORDER — ALBUTEROL SULFATE HFA 90 MCG/ACTUATION AEROSOL INHALER
INHALATION_SPRAY | RESPIRATORY_TRACT | Status: AC
Start: 2023-02-18 — End: 2023-02-18
  Filled 2023-02-18: qty 8

## 2023-02-18 MED ORDER — LIDOCAINE 1 %-EPINEPHRINE 1:100,000 INJECTION SOLUTION
15.0000 mL | Freq: Once | INTRAMUSCULAR | Status: DC | PRN
Start: 2023-02-18 — End: 2023-02-20
  Administered 2023-02-18: 8 mL via INTRAMUSCULAR
  Filled 2023-02-18: qty 15

## 2023-02-18 MED ORDER — TIOTROPIUM BROMIDE 2.5 MCG/ACTUATION MIST FOR INHALATION - RN
2.0000 | Freq: Every day | RESPIRATORY_TRACT | Status: DC
Start: 2023-02-18 — End: 2023-02-20
  Administered 2023-02-18: 0 via RESPIRATORY_TRACT
  Administered 2023-02-19 – 2023-02-20 (×2): 2 via RESPIRATORY_TRACT
  Filled 2023-02-18: qty 4

## 2023-02-18 MED ORDER — HYDROMORPHONE (PF) 0.5 MG/0.5 ML INJECTION SYRINGE
0.2000 mg | INJECTION | INTRAMUSCULAR | Status: DC | PRN
Start: 2023-02-18 — End: 2023-02-18

## 2023-02-18 MED ORDER — ALLOPURINOL 100 MG TABLET
100.0000 mg | ORAL_TABLET | Freq: Two times a day (BID) | ORAL | 1 refills | Status: DC
Start: 2023-02-18 — End: 2023-02-20

## 2023-02-18 MED ORDER — CEFAZOLIN 1 GRAM/50 ML IN DEXTROSE (ISO-OSMOTIC) INTRAVENOUS PIGGYBACK
1.0000 g | INJECTION | Freq: Three times a day (TID) | INTRAVENOUS | Status: AC
Start: 2023-02-18 — End: 2023-02-19
  Administered 2023-02-18: 1 g via INTRAVENOUS
  Administered 2023-02-18 (×2): 0 g via INTRAVENOUS
  Administered 2023-02-18 – 2023-02-19 (×2): 1 g via INTRAVENOUS
  Administered 2023-02-19: 0 g via INTRAVENOUS
  Filled 2023-02-18 (×3): qty 50

## 2023-02-18 MED ORDER — ONDANSETRON HCL (PF) 4 MG/2 ML INJECTION SOLUTION
Freq: Once | INTRAMUSCULAR | Status: DC | PRN
Start: 2023-02-18 — End: 2023-02-18
  Administered 2023-02-18: 4 mg via INTRAVENOUS

## 2023-02-18 MED ORDER — DEXTROSE 5% IN WATER (D5W) FLUSH BAG - 250 ML
INTRAVENOUS | Status: DC | PRN
Start: 2023-02-18 — End: 2023-02-20

## 2023-02-18 MED ORDER — SODIUM CHLORIDE 0.9 % (FLUSH) INJECTION SYRINGE
2.0000 mL | INJECTION | INTRAMUSCULAR | Status: DC | PRN
Start: 2023-02-18 — End: 2023-02-20

## 2023-02-18 MED ORDER — REMIFENTANIL 1 MG INTRAVENOUS SOLUTION
INTRAVENOUS | Status: AC
Start: 2023-02-18 — End: 2023-02-18
  Filled 2023-02-18: qty 1

## 2023-02-18 MED ORDER — SODIUM CHLORIDE 0.9 % INJECTION SOLUTION
Freq: Once | INTRAMUSCULAR | Status: DC | PRN
Start: 2023-02-18 — End: 2023-02-20

## 2023-02-18 MED ORDER — SUGAMMADEX 100 MG/ML INTRAVENOUS SOLUTION
Freq: Once | INTRAVENOUS | Status: DC | PRN
Start: 2023-02-18 — End: 2023-02-18
  Administered 2023-02-18: 250 mg via INTRAVENOUS

## 2023-02-18 MED ORDER — SODIUM CHLORIDE 0.9% FLUSH BAG - 250 ML
INTRAVENOUS | Status: DC | PRN
Start: 2023-02-18 — End: 2023-02-20

## 2023-02-18 MED ORDER — KETAMINE 10 MG/ML INJECTION WRAPPER
INTRAMUSCULAR | Status: AC
Start: 2023-02-18 — End: 2023-02-18
  Filled 2023-02-18: qty 5

## 2023-02-18 MED ORDER — LACTATED RINGERS INTRAVENOUS SOLUTION
INTRAVENOUS | Status: DC | PRN
Start: 2023-02-18 — End: 2023-02-18

## 2023-02-18 MED ORDER — GELATIN MATRIX SEALANT (FLOSEAL) 10 ML KIT
PACK | Freq: Once | CUTANEOUS | Status: DC | PRN
Start: 2023-02-18 — End: 2023-02-20
  Administered 2023-02-18: 10 mL via TOPICAL

## 2023-02-18 MED ORDER — SODIUM CHLORIDE 0.9 % INTRAVENOUS SOLUTION
INTRAVENOUS | Status: DC | PRN
Start: 2023-02-18 — End: 2023-02-18
  Administered 2023-02-18: 0 via INTRAVENOUS

## 2023-02-18 MED ORDER — SURGIFOAM SIZE 100 CM SPONGE
1.0000 | VAGINAL_SPONGE | Freq: Once | CUTANEOUS | Status: DC | PRN
Start: 2023-02-18 — End: 2023-02-20
  Administered 2023-02-18: 1 via TOPICAL

## 2023-02-18 MED ORDER — VANCOMYCIN 10 GRAM INTRAVENOUS SOLUTION
15.0000 mg/kg | Freq: Once | INTRAVENOUS | Status: DC
Start: 2023-02-18 — End: 2023-02-18

## 2023-02-18 MED ORDER — ACETAMINOPHEN 1,000 MG/100 ML (10 MG/ML) INTRAVENOUS SOLUTION
INTRAVENOUS | Status: AC
Start: 2023-02-18 — End: 2023-02-18
  Filled 2023-02-18: qty 100

## 2023-02-18 MED ORDER — SODIUM CHLORIDE 0.9 % (FLUSH) INJECTION SYRINGE
2.0000 mL | INJECTION | Freq: Three times a day (TID) | INTRAMUSCULAR | Status: DC
Start: 2023-02-18 — End: 2023-02-20
  Administered 2023-02-18: 0 mL
  Administered 2023-02-18 – 2023-02-19 (×2): 2 mL
  Administered 2023-02-19: 0 mL
  Administered 2023-02-19: 2 mL
  Administered 2023-02-20 (×2): 0 mL

## 2023-02-18 MED ORDER — LIDOCAINE (PF) 20 MG/ML (2 %) INJECTION SOLUTION
INTRAMUSCULAR | Status: AC
Start: 2023-02-18 — End: 2023-02-18
  Filled 2023-02-18: qty 5

## 2023-02-18 MED ORDER — WATER FOR IRRIGATION, STERILE SOLUTION
1000.0000 mL | Freq: Once | Status: DC | PRN
Start: 2023-02-18 — End: 2023-02-20

## 2023-02-18 MED ORDER — NOREPINEPHRINE 16MG IN NS 250ML INFUSION - FOR ANES
INTRAVENOUS | Status: DC | PRN
Start: 2023-02-18 — End: 2023-02-18
  Administered 2023-02-18: 0 ug/kg/min via INTRAVENOUS
  Administered 2023-02-18: .04 ug/kg/min via INTRAVENOUS
  Administered 2023-02-18: .07 ug/kg/min via INTRAVENOUS
  Administered 2023-02-18: .03 ug/kg/min via INTRAVENOUS
  Administered 2023-02-18: .05 ug/kg/min via INTRAVENOUS

## 2023-02-18 MED ORDER — ALBUTEROL SULFATE 2.5 MG/3 ML (0.083 %) SOLUTION FOR NEBULIZATION
2.5000 mg | INHALATION_SOLUTION | Freq: Once | RESPIRATORY_TRACT | Status: AC
Start: 2023-02-18 — End: 2023-02-18
  Administered 2023-02-18: 2.5 mg via RESPIRATORY_TRACT
  Filled 2023-02-18: qty 3

## 2023-02-18 MED ORDER — FENTANYL (PF) 50 MCG/ML INJECTION SOLUTION
25.0000 ug | INTRAMUSCULAR | Status: DC | PRN
Start: 2023-02-18 — End: 2023-02-18
  Administered 2023-02-18 (×3): 25 ug via INTRAVENOUS
  Filled 2023-02-18: qty 2

## 2023-02-18 MED ORDER — FENTANYL (PF) 50 MCG/ML INJECTION SOLUTION
Freq: Once | INTRAMUSCULAR | Status: DC | PRN
Start: 2023-02-18 — End: 2023-02-18
  Administered 2023-02-18 (×3): 100 ug via INTRAVENOUS

## 2023-02-18 MED ORDER — NOREPINEPHRINE 10 MCG/ML IV DILUTION
Freq: Once | INTRAVENOUS | Status: DC | PRN
Start: 2023-02-18 — End: 2023-02-18
  Administered 2023-02-18 (×5): 6.4 ug via INTRAVENOUS

## 2023-02-18 MED ORDER — LACTATED RINGERS INTRAVENOUS SOLUTION
INTRAVENOUS | Status: DC
Start: 2023-02-18 — End: 2023-02-18
  Administered 2023-02-18: 0 mL via INTRAVENOUS

## 2023-02-18 MED ORDER — VANCOMYCIN 10 GRAM INTRAVENOUS SOLUTION
15.0000 mg/kg | Freq: Two times a day (BID) | INTRAVENOUS | Status: AC
Start: 2023-02-18 — End: 2023-02-19
  Administered 2023-02-18: 0 mg via INTRAVENOUS
  Administered 2023-02-18 – 2023-02-19 (×2): 1500 mg via INTRAVENOUS
  Administered 2023-02-19: 0 mg via INTRAVENOUS
  Filled 2023-02-18 (×2): qty 15

## 2023-02-18 MED ORDER — CYCLOBENZAPRINE 10 MG TABLET
5.0000 mg | ORAL_TABLET | Freq: Three times a day (TID) | ORAL | Status: DC | PRN
Start: 2023-02-18 — End: 2023-02-20
  Administered 2023-02-18: 0 mg via ORAL
  Administered 2023-02-19: 5 mg via ORAL
  Filled 2023-02-18 (×2): qty 1

## 2023-02-18 MED ORDER — MIDAZOLAM 1 MG/ML INJECTION SOLUTION
INTRAMUSCULAR | Status: AC
Start: 2023-02-18 — End: 2023-02-18
  Filled 2023-02-18: qty 2

## 2023-02-18 MED ORDER — SUGAMMADEX 100 MG/ML INTRAVENOUS SOLUTION
INTRAVENOUS | Status: AC
Start: 2023-02-18 — End: 2023-02-18
  Filled 2023-02-18: qty 5

## 2023-02-18 MED ORDER — ACETAMINOPHEN 1,000 MG/100 ML (10 MG/ML) INTRAVENOUS SOLUTION
Freq: Once | INTRAVENOUS | Status: DC | PRN
Start: 2023-02-18 — End: 2023-02-18
  Administered 2023-02-18: 1000 mg via INTRAVENOUS

## 2023-02-18 MED ORDER — KETAMINE 10 MG/ML INJECTION WRAPPER
Freq: Once | INTRAMUSCULAR | Status: DC | PRN
Start: 2023-02-18 — End: 2023-02-18
  Administered 2023-02-18: 20 mg via INTRAVENOUS
  Administered 2023-02-18: 30 mg via INTRAVENOUS

## 2023-02-18 MED ORDER — LIDOCAINE (PF) 100 MG/5 ML (2 %) INTRAVENOUS SYRINGE
INJECTION | Freq: Once | INTRAVENOUS | Status: DC | PRN
Start: 2023-02-18 — End: 2023-02-18
  Administered 2023-02-18: 100 mg via INTRAVENOUS

## 2023-02-18 MED ORDER — ALBUTEROL SULFATE HFA 90 MCG/ACTUATION AEROSOL INHALER - RN
Freq: Once | RESPIRATORY_TRACT | Status: DC | PRN
Start: 2023-02-18 — End: 2023-02-18
  Administered 2023-02-18: 6 via RESPIRATORY_TRACT

## 2023-02-18 MED ORDER — VANCOMYCIN IV - PHARMACIST TO DOSE PER PROTOCOL - NO EXCLUSION CRITERIA
Freq: Every day | Status: AC | PRN
Start: 2023-02-18 — End: 2023-02-19

## 2023-02-18 MED ORDER — SODIUM CHLORIDE 0.9 % (FLUSH) INJECTION SYRINGE
2.0000 mL | INJECTION | Freq: Three times a day (TID) | INTRAMUSCULAR | Status: DC
Start: 2023-02-18 — End: 2023-02-20
  Administered 2023-02-18: 2 mL
  Administered 2023-02-18: 0 mL
  Administered 2023-02-19: 6 mL
  Administered 2023-02-19 (×2): 2 mL
  Administered 2023-02-20 (×2): 0 mL

## 2023-02-18 MED ORDER — ROCURONIUM 10 MG/ML INTRAVENOUS SYRINGE WRAPPER
INJECTION | Freq: Once | INTRAVENOUS | Status: DC | PRN
Start: 2023-02-18 — End: 2023-02-18
  Administered 2023-02-18: 130 mg via INTRAVENOUS

## 2023-02-18 MED ORDER — MIDAZOLAM (PF) 1 MG/ML INJECTION SOLUTION
Freq: Once | INTRAMUSCULAR | Status: DC | PRN
Start: 2023-02-18 — End: 2023-02-18
  Administered 2023-02-18: 2 mg via INTRAVENOUS

## 2023-02-18 MED ORDER — ONDANSETRON HCL (PF) 4 MG/2 ML INJECTION SOLUTION
INTRAMUSCULAR | Status: AC
Start: 2023-02-18 — End: 2023-02-18
  Filled 2023-02-18: qty 2

## 2023-02-18 MED ORDER — VANCOMYCIN 1,000 MG IV POWDER - FOR OR
1.0000 g | Freq: Once | TOPICAL | Status: DC | PRN
Start: 2023-02-18 — End: 2023-02-20

## 2023-02-18 MED ORDER — BUDESONIDE-FORMOTEROL HFA 160 MCG-4.5 MCG/ACTUATION AEROSOL INHALER - RN
2.0000 | Freq: Two times a day (BID) | Status: DC
Start: 2023-02-18 — End: 2023-02-20
  Administered 2023-02-18: 2 via RESPIRATORY_TRACT
  Administered 2023-02-18: 0 via RESPIRATORY_TRACT
  Administered 2023-02-19 – 2023-02-20 (×3): 2 via RESPIRATORY_TRACT
  Filled 2023-02-18: qty 6

## 2023-02-18 MED ORDER — HYDROMORPHONE (PF) 0.5 MG/0.5 ML INJECTION SYRINGE
0.4000 mg | INJECTION | INTRAMUSCULAR | Status: DC | PRN
Start: 2023-02-18 — End: 2023-02-18

## 2023-02-18 MED ORDER — PROPOFOL 10 MG/ML INTRAVENOUS EMULSION
INTRAVENOUS | Status: DC | PRN
Start: 2023-02-18 — End: 2023-02-18
  Administered 2023-02-18: 150 ug/kg/min via INTRAVENOUS
  Administered 2023-02-18: 140 ug/kg/min via INTRAVENOUS
  Administered 2023-02-18: 100 ug/kg/min via INTRAVENOUS
  Administered 2023-02-18: 110 ug/kg/min via INTRAVENOUS
  Administered 2023-02-18: 0 ug/kg/min via INTRAVENOUS

## 2023-02-18 MED ORDER — VANCOMYCIN 1,000 MG INTRAVENOUS INJECTION
Freq: Once | INTRAVENOUS | Status: AC | PRN
Start: 2023-02-18 — End: 2023-02-18

## 2023-02-18 SURGICAL SUPPLY — 41 items
ADH SKNCLS CYNCRLT EXOFIN HVSC STRL TISS LF  DISP 1G (MED SURG SUPPLIES) ×1 IMPLANT
APPL 70% ISPRP 2% CHG 26ML CHLRPRP HI-LT ORNG PREP STRL LF  DISP CLR (MED SURG SUPPLIES) ×2 IMPLANT
BAG 28X36IN BAND EQP (DRAPE/PACKS/SHEETS/OR TOWEL) ×1 IMPLANT
BIT DRILL 16MM STRL REUSE (SURGICAL CUTTING SUPPLIES) ×1 IMPLANT
BIT DRILL 3.8MM 3MM PREC NEURO STRL LF  DISP (SURGICAL CUTTING SUPPLIES) ×1 IMPLANT
BIT DRILL 4.5MM 1.5MM STP (SURGICAL CUTTING SUPPLIES) ×1 IMPLANT
BLANKET ADULT 85.8X50IN FRC AIR WARM LF  WHT (MED SURG SUPPLIES) ×1 IMPLANT
BLANKET MISTRAL-AIR ADULT LWR BODY 55.9X40.2IN FRC AIR HI VOL BLWR INTUITIVE CONTROL PNL LRG LED (MED SURG SUPPLIES) ×1 IMPLANT
BURR SURG 3MM PREC RND (SURGICAL CUTTING SUPPLIES) ×1 IMPLANT
CONV USE ITEM 338656 - PACK SURG NEURO SPINE NONST DISP LF (CUSTOM TRAYS & PACK) ×1 IMPLANT
CONV USE ITEM 338660 - PACK SURG NEURO ACD NONST DISP LF (CUSTOM TRAYS & PACK) ×1 IMPLANT
COVER CAM LEN DISP CLR STRL LF (DRAPE/PACKS/SHEETS/OR TOWEL) ×1 IMPLANT
DISC USE 329826 - LOOP VESSEL MAXI LF  RD RADOPQ SIL DVN DEV-O-LOOP STRL (PERFUSION/HEART SUPPLIES) ×1
DISCONTINUED USE 329826 - LOOP VESSEL MAXI LF  RD RADOPQ SIL DVN DEV-O-LOOP STRL (PERFUSION/HEART SUPPLIES) ×1 IMPLANT
DRAIN SUCT RND 10FR_SU130-1321 (MED SURG SUPPLIES) IMPLANT
DRAPE CARM FLRSCP EXPD CLPSBL C-ARMOR STRL EQP (DRAPE/PACKS/SHEETS/OR TOWEL) ×1 IMPLANT
DRAPE CARM POLY STRAP MBL XRY 72X42IN LF  EQP (DRAPE/PACKS/SHEETS/OR TOWEL) ×2 IMPLANT
DRAPE SPLT FENESTRATE ABS REINF 108X77IN PRXM LF  STRL DISP SURG SMS 36X29IN BLU (DRAPE/PACKS/SHEETS/OR TOWEL) ×2 IMPLANT
ELECTRODE ESURG BLADE 2.75IN 3/32IN EDGE STRL .2IN DISP INSL STD SHAFT HEX LOCK LF (SURGICAL CUTTING SUPPLIES) ×1 IMPLANT
ELECTRODE ESURG BLADE PNCL 10FT VLAB STRL SS DISP BUTTON SWH HEX LOCK CORD HLSTR LF  ACPT 3/32IN STD (SURGICAL CUTTING SUPPLIES) ×1 IMPLANT
FILLER BONE VOID 2.5ML SPHR BIOACTIVE GLS BSPH PUTTY RSRB (IMPLANTS TRAUMA) ×1 IMPLANT
GARMENT COMPRESS MED CALF CENTAURA NYL VASOGRAD LTWT BRTHBL SEQ FIL BLU 18- IN (MED SURG SUPPLIES) ×1 IMPLANT
HANDLE RIGID PLASTIC STRL LF  DISP DVN EZ HNDL SURG LIGHT (MED SURG SUPPLIES) ×1 IMPLANT
HEMOSTAT ABS 8X4IN FLXB SHR WV_SRGCL STRL DISP (WOUND CARE SUPPLY) ×1 IMPLANT
HOLDER LIMB 13X3IN UNIV QUICK RELEASE BCKL TIE 52IN NONST LF (MED SURG SUPPLIES) ×1 IMPLANT
KIT DRAIN 10FR PVC 3 SPRG RESERVOIR TROCAR Y CONN JP 1/8IN RND 400ML STRL LF  DISP (WOUND CARE SUPPLY) ×1 IMPLANT
MBO USE ITEM 317672 - HANDLE RIGID PLASTIC STRL LF  DISP DVN EZ HNDL SURG LIGHT (MED SURG SUPPLIES) ×1 IMPLANT
MILL BONE BIOPSY MED CRSE BLADE 5MM LF  DISP (SURGICAL CUTTING SUPPLIES) ×1 IMPLANT
PACK SURG NEURO ACD NONST DISP LF (CUSTOM TRAYS & PACK) ×1
PACK SURG NEURO SPINE NONST DISP LF (CUSTOM TRAYS & PACK) ×1
PIN ADULT MAYFIELD WNG GRV PLASTIC CRNM SKULL DISP STRL (MED SURG SUPPLIES) ×1 IMPLANT
PIN DISTRACT 14MM SPINE (MED SURG SUPPLIES) ×2 IMPLANT
PLATE VIP 19MM L1 SPINE CERV ANT  BONE NONST LF (IMPLANTS SPINE) ×1 IMPLANT
POSITION POSITION 9IN DONUT HEAD FOAM (MED SURG SUPPLIES) ×1 IMPLANT
RESERVOIR DRAIN SIL JP BULB 100CC STRL LF  DISP (MED SURG SUPPLIES) IMPLANT
RETRACTR MRS MIS SPINE SURG DISP (MED SURG SUPPLIES) ×1 IMPLANT
SCREW BONE VIP 4.6MM 16MM SLF TAP VAR ANG SPINE NONST LF (IMPLANTS SPINE) ×2 IMPLANT
SPACER SPINAL 18X10MM CLN 7D 15MM 2XL PEEK TI ACDF STRL LF (IMPLANTS SPINE) ×1 IMPLANT
SPONGE SURG .5X.5IN ABS PREC CUT RADOPQ PATTIE CTTND CDMN SUTUREWELD STRL LF  DISP (MED SURG SUPPLIES) ×1 IMPLANT
TAPE ADH 3IN 1538-3 BX/4 ROLLS (WOUND CARE SUPPLY) ×2 IMPLANT
TRAY CATH 16FR 1 LYR FOLEY DRAIN BAG TEMP SENSOR PERI WIPE SIL PVP 400ML 2.5L 10CC LF (UROLOGICAL SUPPLIES) ×1 IMPLANT

## 2023-02-18 NOTE — Anesthesia Postprocedure Evaluation (Signed)
Anesthesia Post Op Evaluation    Patient: [name] [name]  Procedure(s) with comments:  DISCECTOMY SPINE CERVICAL ANTERIOR MICRO WITH FUSION AND INSTRUMENTATION 1 LEVEL - C3-4    Last Vitals:Temperature: 36.6 C (97.9 F) (02/18/23 1215)  Heart Rate: 94 (02/18/23 1115)  BP (Non-Invasive): 132/82 (02/18/23 1115)  Respiratory Rate: 19 (02/18/23 1115)  SpO2: 90 % (02/18/23 1115)    No notable events documented.    Patient is sufficiently recovered from the effects of anesthesia to participate in the evaluation and has returned to their pre-procedure level.  Patient location during evaluation: PACU       Patient participation: complete - patient participated  Level of consciousness: awake  Multimodal Pain Management: Multimodal analgesia used between 6 hours prior to anesthesia start to PACU discharge  Airway patency: patent    Anesthetic complications: no  Cardiovascular status: acceptable  Respiratory status: airway suctioned, acceptable and spontaneous ventilation  Hydration status: acceptable  Patient post-procedure temperature: Pt Normothermic   PONV Status: Absent

## 2023-02-18 NOTE — Progress Notes (Signed)
[facility]  Medicine Progress Note    [name] [name]  Date of service: 02/18/2023  Date of Admission:  02/15/2023    Hospital Day:  LOS: 3 days       Assessment/ Plan:   Active Hospital Problems    Diagnosis    Primary Problem: Cervical stenosis of spine       [name] [name] is a 57 y.o., male with PMH significant for COPD, gout who presents with progressive weakness.     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 Patient ideally would like to get off Suboxone entirely     Obesity    Body mass index is 34.8 kg/m.  Protein Calorie Malnutrition  -Patient meets criteria based on the following clinical characteristics:     -Nutrition consulted     -MNT protocol ordered  -monitor bowel functions  Lab Results   Component Value Date    ALBUMIN 3.8 02/15/2023        Hardware (lines, foley's, tubes):  Patient Lines/Drains/Airways Status       Active Line / Dialysis Catheter / Dialysis Graft / Drain / Airway / Wound       Name Placement date Placement time Site Days     Peripheral IV Anterior;Left Dorsal Metacarpals  (top of hand) 02/18/23  0545  -- less than 1    Peripheral IV Right;Posterior Dorsal Metacarpals  (top of hand) 02/18/23  0747  -- less than 1    Arterial Line Right Radial 02/18/23  0833  Radial  less than 1    Wound  Incision Right Neck 02/18/23  0827  -- less than 1                     DVT/PE Prophylaxis: lovenox    Disposition Planning: TBD      Murtis Sink, MD  [facility]  Assistant Professor of Medicine  Pager [phone]    On 02/18/2023 I spent a total visit time of 51 minutes. Time included review of tests and ordering tests, obtaining/reviewing history, examining the patient, communicating with consultants, documenting clinical information and counseling the patient and/or family regarding the diagnosis and management plan and coordination of care involved services directly related to patient care.    FOLLOW UP NOTE LEVEL 3 (TOTAL TIME > 50 MINUTES) (32440)    ____________________________________________________________________________________________________________________________________________________________________    Chief Complaint: resp failure, central cord syndrome  Subjective: NAEO. Seen post op. States  he has neck pain and some pain with swallowing but has better sensation in his fingers. Still with some numbness. Breathing stable but still requiring 6L.     Vital Signs:  Temp  Avg: 36.7 C (98 F)  Min: 36.5 C (97.7 F)  Max: 36.9 C (98.4 F)    Pulse  Avg: 94.1  Min: 82  Max: 115 BP  Min: 91/54  Max: 156/85   Resp  Avg: 17.5  Min: 12  Max: 23 SpO2  Avg: 92.1 %  Min: 89 %  Max: 96 %          Input/Output    Intake/Output Summary (Last 24 hours) at 02/18/2023 1304  Last data filed at 02/18/2023 1000  Gross per 24 hour   Intake 2075 ml   Output 1475 ml   Net 600 ml    I/O last shift:  04/15 0700 - 04/15 1859  In: 1115 [I.V.:900]  Out: -    acetaminophen (TYLENOL) tablet, 650 mg, Oral, Q4H PRN  albuterol (PROVENTIL) 2.5 mg / 3 mL (0.083%) neb  solution, 2.5 mg, Nebulization, Q4H PRN  allopurinol (ZYLOPRIM) tablet, 100 mg, Oral, 2x/day  budesonide-formoterol (SYMBICORT) 160 mcg-4.5 mcg per inhalation oral inhaler - "Nursing to administer", 2 Puff, Inhalation, 2x/day  ceFAZolin (ANCEF) 1 g in iso-osmotic 50 mL premix IVPB, 1 g, Intravenous, Q8H  D5W 250 mL flush bag, , Intravenous, Q15 Min PRN  D5W 250 mL flush bag, , Intravenous, Q15 Min PRN  D5W 250 mL flush bag, , Intravenous, Q15 Min PRN  diazePAM (VALIUM) 5 mg/mL injection, 5 mg, Intravenous, Q30 Min PRN  [Held by provider] enoxaparin PF (LOVENOX) 40 mg/0.4 mL SubQ injection, 40 mg, Subcutaneous, Daily  gabapentin (NEURONTIN) capsule, 300 mg, Oral, 2x/day  guaiFENesin  per 5mL oral liquid - for cough (expectorant), 200 mg, Oral, Q4H PRN  HYDROmorphone (DILAUDID) 0.5 mg/0.5 mL injection, 0.5 mg, Intravenous, Q3H PRN  nicotine (NICODERM CQ) transdermal patch (mg/24 hr), 21 mg, Transdermal, Daily  NS 250 mL flush bag, , Intravenous, Q15 Min PRN  NS 250 mL flush bag, , Intravenous, Q15 Min PRN  NS 250 mL flush bag, , Intravenous, Q15 Min PRN  NS flush syringe, 2-6 mL, Intracatheter, Q8HRS  NS flush syringe, 2-6 mL, Intracatheter, Q1 MIN PRN  NS flush syringe, 2-6 mL, Intracatheter, Q8HRS  NS flush syringe, 2-6 mL, Intracatheter, Q1 MIN PRN  NS flush syringe, 2-6 mL, Intracatheter, Q8HRS  NS flush syringe, 2-6 mL, Intracatheter, Q1 MIN PRN  ondansetron (ZOFRAN) 2 mg/mL injection, 4 mg, Intravenous, Q6H PRN  oxyCODONE (ROXICODONE) immediate release tablet, 5 mg, Oral, Q4H PRN   Or  oxyCODONE (ROXICODONE) immediate release tablet, 10 mg, Oral, Q4H PRN  polyethylene glycol (MIRALAX) oral packet, 17 g, Oral, Daily  predniSONE (DELTASONE) tablet, 40 mg, Oral, Daily  SSIP insulin lispro (HumaLOG) 100 units/mL injection, 0-12 Units, Subcutaneous, 4x/day PRN  tiotropium bromide (SPIRIVA RESPIMAT) 2.5 mcg per inhalation oral inhaler - "Nursing to administer", 2 Puff, Inhalation, Daily  vancomycin (VANCOCIN)  1,500 mg in NS 500 mL IVPB, 15 mg/kg (Adjusted), Intravenous, Q12H  Vancomycin IV - Pharmacist to Dose per Protocol, , Does not apply, Daily PRN          Physical Exam  BP (!) 135/96   Pulse 97   Temp 36.6 C (97.9 F)   Resp (!) 23   Ht 1.88 m (6' 2.02")   Wt 123 kg (271 lb 2.7 oz)   SpO2 (!) 89%  BMI 34.80 kg/m     Constitutional: AOx 3, NAD, appears age appropriate  Respiratory: diminished but CTAB. No wheeze or rales.   Cardiovascular: rrr, S1, S2 normal, no murmur, click, rub or gallop  Gastrointestinal: Soft, NT, Bowel sounds normal, non-distended  Musculoskeletal: Head atraumatic and normocephalic. No significant edema.   Integumentary:  Skin warm and dry, No rashes and No lesions  Neurologic: moving all extremities, No tremor  Psychiatric: Normal affect, behavior    Labs:    CBC  Diff   Lab Results   Component Value Date/Time    WBC 11.0 02/18/2023 02:32 AM    HGB 16.5 02/18/2023 02:32 AM    HCT 51.6 02/18/2023 02:32 AM    PLTCNT 213 02/18/2023 02:32 AM    RBC 5.25 02/18/2023 02:32 AM    MCV 98.3 02/18/2023 02:32 AM    MCHC 32.0 02/18/2023 02:32 AM    MCH 31.4 02/18/2023 02:32 AM    RDW 14.1 02/14/2023 05:32 PM    MPV 9.4 02/18/2023 02:32 AM    Lab Results   Component Value Date/Time    PMNS 59.9 02/18/2023 02:32 AM    LYMPHOCYTES 4 02/16/2023 05:19 AM    EOSINOPHIL 0 02/16/2023 05:19 AM    MONOCYTES 12.9 02/18/2023 02:32 AM    BASOPHILS 0.3 02/18/2023 02:32 AM    BASOPHILS <0.10 02/18/2023 02:32 AM    PMNABS 6.58 02/18/2023 02:32 AM    LYMPHSABS 2.87 02/18/2023 02:32 AM    EOSABS <0.10 02/18/2023 02:32 AM    MONOSABS 1.41 (H) 02/18/2023 02:32 AM    BASABS 0.09 06/11/2022 12:22 PM          Comprehensive Metabolic Profile    Lab Results   Component Value Date/Time    SODIUM 133 (L) 02/18/2023 08:49 AM    POTASSIUM 4.3 02/18/2023 02:32 AM    CHLORIDE 101 02/18/2023 08:49 AM    CO2 31 (H) 02/18/2023 02:32 AM    ANIONGAP 9 02/18/2023 02:32 AM    BUN 19 02/18/2023 02:32 AM    CREATININE 0.81 02/18/2023  02:32 AM    GLUCOSE 118 02/18/2023 08:49 AM    Lab Results   Component Value Date/Time    CALCIUM 9.6 02/18/2023 02:32 AM    PHOSPHORUS 3.9 02/17/2023 03:48 AM    ALBUMIN 3.8 02/15/2023 08:46 AM    TOTALPROTEIN 7.7 02/15/2023 08:46 AM    ALKPHOS 88 02/15/2023 08:46 AM    AST 23 02/15/2023 08:46 AM    ALT 23 02/15/2023 08:46 AM    TOTBILIRUBIN 0.4 02/15/2023 08:46 AM        CARDIAC MARKERS  Lab Results   Component Value Date    CPK 97 02/15/2023            Radiology:      Results for orders placed during the hospital encounter of 02/15/23    CT CHEST ABDOMEN PELVIS W IV CONTRAST 02/15/2023 10:07 AM    Impression  1. No evidence of acute abnormality in the chest, abdomen, and pelvis.  2. Nonobstructing right-sided nephrolithiasis.

## 2023-02-18 NOTE — Pharmacy (Signed)
[facility] Department of Pharm[facility] Services  Therapeutic Drug Monitoring: Vancomycin  02/18/2023      Patient name: [name] [name], [name] [name]  Date of Birth:  November 25, 1965    Actual Weight:  Weight: 123 kg (271 lb 2.7 oz) (02/18/23 0610)     BMI:  BMI (Calculated): 34.87 (02/18/23 0610)      Date RPh Current regimen (including mg/kg) Indication &  Organism AUC or trough based dosing Target Levels^ SCr (mg/dL) CrCl* (mL/min) Infectious Laboratory Markers (as applicable)   Measured level(s)   (mcg/mL) Calculated AUC (if AUC based monitoring) Plan & predicted AUC/trough if initial dosing (including when levels are due) Comments   4/15 Eric na surgical   0.81 >100 WBC:  Procal:  CRP:   Vanc 15 mg/kg adj    Vanc 1500mg  x1 Surgical dose only   4/15 LM Vanc 1500 Q12 x 2 doses Surgical                                                                                             ^Target levels depends on dosing and monitoring method, AUC vs. trough based. For AUC based dosing units are mg*h/L. For trough based dosing units are mcg/mL.     *Creatinine clearance is estimated by using the Cockcroft-Gault equation for adult patients and the Brendolyn Patty for pediatric patients.    The decision to discontinue vancomycin therapy will be determined by the primary service.  Please contact the pharmacist with any questions regarding this patient's medication regimen.

## 2023-02-18 NOTE — Care Management Notes (Addendum)
[facility]  Care Management Initial Evaluation    Patient Name: [name] [name]  Date of Birth: [birth year]  Sex: male  Date/Time of Admission: 02/15/2023  7:29 AM  Room/Bed: 03/A  Payor: HEALTH PLAN MEDICAID / Plan: HEALTH PLAN MEDICAID / Product Type: Medicaid MC /   Primary Care Providers:  Levon Hedger, DO (General)    Pharmacy Info:   Preferred Pharmacy       [facility] [id number] - [place], [place] - [address]    [address] [place] [address]    Phone: [phone] Fax: [phone]    Hours: Not open 24 hours          Emergency Contact Info:   Extended Emergency Contact Information  Primary Emergency Contact: Lienemann,AMBER  Mobile Phone: [phone]  Relation: Daughter  Preferred language: English  Interpreter needed? No    History:   KAINOA SWOBODA is a 57 y.o., male, admitted for cervical stenosis of spine.    Height/Weight: 188 cm (6' 2.02") / 123 kg (271 lb 2.7 oz)     LOS: 3 days   Admitting Diagnosis: Cervical stenosis of spine [M48.02]    Assessment:      02/18/23 1439   Assessment Details   Assessment Type Admission   Date of Care Management Update 02/18/23   Date of Next DCP Update 02/21/23   Readmission   Is this a readmission? No   Insurance Information/Type   Insurance type Medicaid  (Health Plan Medicaid)   Employment/Financial   Patient has Prescription Coverage?  Yes   Financial Concerns none   Living Environment   Select an age group to open "lives with" row.  Adult   Lives With alone   Living Arrangements house   Able to Return to Prior Arrangements yes   Home Safety   Home Accessibility stairs to enter home   Care Management Plan   Discharge Planning Status initial meeting   Discharge plan discussed with: Patient  (patient's significant other Misty Stanley))   CM will evaluate for rehabilitation potential yes   Patient choice offered to patient/family yes   Discharge Needs Assessment   Equipment Currently Used at Home cane,  straight;walker, standard;shower Research officer, trade union Needed After Discharge   (pending recommendations)   Discharge Facility/Level of Care Needs Home with Home Health (code 6)   Transportation Available family or friend will provide;car   Referral Information   Admission Type inpatient   Address Verified verified-no changes   Arrived From acute hospital, other   Acute Care Facility [facility]   ADVANCE DIRECTIVES   Does the Patient have an Advance Directive? No, Information Offered and Refused   LAY CAREGIVER    Appointed Lay Caregiver? Yes   Lay Caregiver Name Nobuo Nunziata   Lay Caregiver Relationship to patient child   Lay Caregiver Contact Number [phone]   Home Main Entrance   Number of Stairs, Main Entrance one       MSW met with patient and patient's significant other Misty Stanley) at bedside to complete initial assessment. Patient resides alone in a house with 1 STE. Patient reported that his significant other is available to provide assistance and transportation. MSW confirmed address, pharmacy, and PCP information. Sharrod reported that he uses [facility] in [place] and identified his primary care physician as Riley Lam [facility]. PT is recommending home with assist, home with home health; OT is recommending home with 24/7 assistance,  home with home health. Patient does not report previous Home Health, Oxygen, or Dialysis services prior to admission. Provided patient CarePort listing with CMS ratings for Home Health serving the [phone] zip code.    Discharge Plan:  Home with Home Health (code 6)    The patient will continue to be evaluated for developing discharge needs.     Case Manager: Gae Bon, SOCIAL WORKER  Phone: [phone]

## 2023-02-18 NOTE — OR Surgeon (Signed)
PATIENT NAME: [name] [name], [name] [name]  HOSPITAL NUMBER:  [id number]  DATE OF SERVICE: 02/18/2023  DATE OF BIRTH:  [birth year]    OPERATIVE REPORT    PREOPERATIVE DIAGNOSES:  1. Cervical spondylitic myelopathy C3-C4.  2. Cervical stenosis.  3. Cervical radiculopathy.  4. Cervical neck pain.    POSTOPERATIVE DIAGNOSES:  1. Cervical spondylitic myelopathy C3-C4.  2. Cervical stenosis.  3. Cervical radiculopathy.  4. Cervical neck pain.    NAME OF PROCEDURES:  1. C3-C4 anterior cervical diskectomy and arthrodesis using the Globus Colonial TPS spacer 10 mm height.  2. Allograft biosynthetic bone matrix for intervertebral arthrodesis C3 to C4.  3. Anterior titanium Globus VIP plate 19 mm affixed with 16 mm variable screws C3-C4.  4. Monitoring for cervical decompression and fusion.    SURGEON:  Mariann Laster, MD, PhD, Janetta Hora.    ASSISTANT:  Shanna Cisco, MD.    COMPLICATIONS:  None.    ESTIMATED BLOOD LOSS:  50 mL.    DRAIN:  None.    INDICATIONS FOR PROCEDURE:  The patient is a very unfortunate elderly gentleman with a history of long-term smoking and obstructive sleep apnea who presented to the hospital with weakness of the upper extremities as well as numbness.  MRI revealed that he had developed severe high-grade cervical stenosis at C3-C4 with small amounts of cord signal change at that level, combined with severe cervical stenosis giving him the diagnosis of cervical spondylitic myelopathy.  Based on his symptoms, he is brought to the operative suite for surgical intervention.    DESCRIPTION OF PROCEDURE:  Prior to the procedure, a time-out was completed.  His allergies were reviewed.  He was given weight-based IV antibiotics, 10 mg of IV Decadron, intubated, and placed in supine position on the operative table.  The right side of his neck was then prepped with alcohol and chlorhexidine, and draped in a sterile fashion.  Approximately 8 mL of anesthetic with epinephrine was injected on the right side of the neck and  a 15 blade scalpel was used to make a 2-inch curvilinear incision.  The platysma was divided vertically and dissection was carried down medial to the sternocleidomastoid, common carotid, and internal jugular until the spine was intersected.  Medial and lateral Wendi Maya retractors were inserted at the C3-C4 level with Caspar pins using fluoroscopic imaging as a guide.  The disk was identified and then removed with a combination of Bovie electrocautery, pituitary rongeur, a Kerrison punch, and a precision drill.  The posterior longitudinal ligament was resected with a 1 mm Kerrison punch and generous bilateral medial foraminotomies were completed.  Once the decompression was complete, we inserted a 10 mm Globus Colonial TPS spacer, extra, extra large endplate backfilled with 0.4 cc of allograft biosynthetic bone matrix and inserted that across the C3-C4 disc space.  The Caspar pins were then removed and a 19 mm VIP titanium plate was then affixed to the C3-C4 vertebral bodies, torquing the plate to the screws in the standard fashion completing the arthrodesis.  All Caspar pin and retractor blades were removed, all bleeding edges cauterized, and the retractors out.  We then closed the platysma with 3-0 Vicryl, closed the skin with 3-0 Vicryl, and placed Exofin on the skin surface.  The patient was then extubated and brought to step-down for continued evaluation and management.  There were no complications to the neurosurgical procedure.        Mariann Laster, MD, PhD, [facility]  Associate Professor  [facility] Department of  Neurosurgery              DD:  02/18/2023 13:11:08  DT:  02/18/2023 14:34:37 KLN  D#:  [id number]

## 2023-02-18 NOTE — Nurses Notes (Addendum)
Pt complains of neck incision site pain. Given PRN dilaudid.

## 2023-02-18 NOTE — Anesthesia Preprocedure Evaluation (Signed)
ANESTHESIA PRE-OP EVALUATION  Planned Procedure: DISCECTOMY SPINE CERVICAL ANTERIOR MICRO WITH FUSION AND INSTRUMENTATION 1 LEVEL (Spine Cervical)  DECOMPRESSION SPINE CERVICAL POSTERIOR FUSION WITH INSTRUMENTATION 3 LEVELS OR MORE (Spine Cervical)  Review of Systems     anesthesia history negative     patient summary reviewed  nursing notes reviewed        Pulmonary   COPD,   Cardiovascular  negative cardio ROS,          GI/Hepatic/Renal    kidney stones        Endo/Other    obesity,      Neuro/Psych/MS    weakness     Cancer    negative hematology/oncology ROS,                     Physical Assessment      Airway       Mallampati: I    TM distance: >3 FB    Neck ROM: full  Mouth Opening: fair.  Facial hair  Beard        Dental           (+) poor dentition           Pulmonary    Breath sounds clear to auscultation       Cardiovascular    Rhythm: regular  Rate: Normal       Other findings              Plan  ASA 4     Planned anesthesia type: general     general anesthesia with endotracheal tube intubation      PONV Plan:  I plan to administer pharmcologic prophalaxis antiemetics          Additional Plans: Arterial line    Intravenous induction     Anesthesia issues/risks discussed are: Dental Injuries, PONV, Cardiac Events/MI, Stroke, Intraoperative Awareness/ Recall, Aspiration, Blood Loss, Difficult Airway, Sore Throat and Art Line Placement.  Anesthetic plan and risks discussed with patient  signed consent obtained      Use of blood products discussed with patient who consented to blood products.      Patient's NPO status is appropriate for Anesthesia.           Plan discussed with CRNA.               Patient with room air O2 sats in 80's.  Etiology not completely confirmed.  Preop, spontaneous breathing ABG on 40% FiO2 of:       Latest Reference Range & Units 02/16/23 08:14   %FIO2 (ARTERIAL) % 40   PH 7.35 - 7.45  7.39   PCO2 35 - 45 mm/Hg 55 (H)   PO2 83 - 108 mm/Hg 62 (L)   BICARBONATE 21.0 - 28.0 mmol/L 30.0  (H)   BASE EXCESS 0.0 - 3.0 mmol/L 6.7 (H)   PAO2/FIO2 RATIO  155   O2 SATURATION (ARTERIAL) % 91.1   (H): Data is abnormally high  (L): Data is abnormally low    Lung CT unremarkable for this degree of lung disease.  TTE this hospitalization does not show any findings that could explain this level of lung disease.  Is a lifelong smoker.      Discussed with patient risks of intubation and anesthesia, especially given pulmonary disease without confirmed etiology.  Risks include:  post op to ICU intubated, lung damage, need for continued mechanical ventilation (trach).  Plan for pre-induction a line and mask-strap CPAP  prior to intubation.      Also discussed risks of intubation given spinal cord issues, including worsening of weakness.  Plan to ensure comfort prior to going to sleep and then use RSI CMAC with limited neck motion.    Erin Sons MD, MPP  Anesthesiologist

## 2023-02-18 NOTE — Consults (Signed)
[facility]  Neurosurgery Consult  Follow Up Note    [name], [name], 57 y.o. male  Date of Service: 02/18/2023  Date of Birth:  May 04, 1966    Hospital Day:  LOS: 3 days     Chief Complaint:  weakness   Subjective: Patient reporting pain in neck between shoulders and down bilateral arms (burning/stabbing pain)    Objective:  Temperature: 36.6 C (97.9 F)  Heart Rate: 83  BP (Non-Invasive): 132/80  Respiratory Rate: 16  SpO2: 93 %  Neurologic Exam:  A&Ox3  GCS Fluent speech  Appropriate fund of knowledge  Appropriate attention span & concentration  Appropriate recent and remote memory  CN 2 PERRL  CN EOMI  CN 7 Face symmetric  CN 8 Hearing grossly intact  CN 11 shrug symmetric  CN 12 Tongue midline  Muscle Strength 5/5 BUE except 4+/5 grip strength, 5/5 RLE, 4+/5 LLE except 3/5 L HF  Muscle tone WNL  Paresthesias to BUE and BLE  No drift  No hoffman   No clonus    Assessment/Recommendations:  [name] [name] is a 57 y.o. male with PMH of COPD and renal nephrolithiasis who presents with a 2 month history of BUE and BLE weakness and paraesthesias, acutely worsening over the past 2 weeks.   -- OR today for C3-C4 ACDF   -- Consent completed at bedside   -- Preop labs appropriate for surgery   -- Cefazolin and Vancomycin for surgical ppx  -- Keep NPO+ mIVF, hold ACEI/ARB & DVTppx at midnight prior to surgery  -- No activity restrictions   -- Further updates following surgery  -- Imaging:                -- MRI C spine (02/15/23): severe C3-C4 stenosis               -- XR cervical upright (02/15/23): stable alignment               -- CT CAP w (02/15/23): unremarkable other than right-sided non-obstructive renal calculus               -- MRI thoracic wwo (02/14/23): unremarkable               -- MRI cervical wwo (02/14/23): severe spinal canal stenosis at C3-4               -- MRI brain wwo (02/14/23): nonspecific white matter disease that could be small vessel changes or demyelinating process    Please  call with any questions or concerns.     Durward Fortes, MD PhD  PGY-4  02/18/2023;06:58          Late entry for 02/18/23. I saw and examined the patient.  I reviewed the resident's note.  I agree with the findings and plan of care as documented in the resident's note.  Any exceptions/additions are edited/noted.    Mariann Laster, MD

## 2023-02-18 NOTE — Anesthesia Procedure Notes (Signed)
[name] [name]    Arterial Line Procedure    Pt location: In OR  Consent:     Consent given by:  Patient    Risks discussed:  Bleeding, infection and pain  Universal protocol:     Procedure explained and questions answered to patient or proxy's satisfaction: yes      Immediately prior to procedure a time out was called: yes      Patient identity confirmed:  Verbally with patient, hospital-assigned identification number and arm band  Pre-procedure details:     Preparation: Preprocedure hand washing was performed; sterile field was maintained       Image:  Not saved  Skin Prep used: Chlorhexidine gluconate  Anesthesia (see MAR for exact dosages):     Anesthesia method:  local infiltration    Local anesthetic:  lidocaine 2% w/o epi    A 20 G Catheter type: Arrow  in length,  Placed on the right  radial artery  using palpation, anatomical landmarks and Seldinger With  number of attempts:1.Secured with: transparent dressing   MEDICATIONS:     Post-procedure details:    Patient tolerance of procedure:  Tolerated well, no immediate complications blood withdrawn easily, flushes easily, good waveform, Correlates with cuff, Flush/aspirate well and Calibrated  Complications:none  Performed By:  Performing provider: Thayer Ohm, RN Authorizing provider: Erin Sons, MD

## 2023-02-18 NOTE — Pharmacy (Signed)
[facility] / Department of Pharmaceutical Services  Therapeutic Drug Monitoring: Vancomycin  02/18/2023      Patient name: [name] [name], [name] [name]  Date of Birth:  29-Jan-1966    Actual Weight:  Weight: 123 kg (271 lb 2.7 oz) (02/18/23 0610)     BMI:  BMI (Calculated): 34.87 (02/18/23 0610)      Date RPh Current regimen (including mg/kg) Indication &  Organism AUC or trough based dosing Target Levels^ SCr (mg/dL) CrCl* (mL/min) Infectious Laboratory Markers (as applicable)   Measured level(s)   (mcg/mL) Calculated AUC (if AUC based monitoring) Plan & predicted AUC/trough if initial dosing (including when levels are due) Comments   4/15 Khoi Hamberger na surgical   0.81 >100 WBC:  Procal:  CRP:   Vanc 15 mg/kg adj    Vanc 1500mg  x1 Surgical dose only                                                                                                   ^Target levels depends on dosing and monitoring method, AUC vs. trough based. For AUC based dosing units are mg*h/L. For trough based dosing units are mcg/mL.     *Creatinine clearance is estimated by using the Cockcroft-Gault equation for adult patients and the Brendolyn Patty for pediatric patients.    The decision to discontinue vancomycin therapy will be determined by the primary service.  Please contact the pharmacist with any questions regarding this patient's medication regimen.

## 2023-02-18 NOTE — Brief Op Note (Signed)
[facility]                                                     BRIEF OPERATIVE NOTE    Patient Name: [name] [name], [name] [name] Number: [id number]  Date of Service: 02/18/2023   Date of Birth: 06-27-1966    All elements must be documented.    Pre-Operative Diagnosis: Cervical stenosis with myelopathy   Post-Operative Diagnosis: Same  Procedure(s)/Description:  C3-4 ACDF  Findings/Complexity (inherent to the procedure performed): See above     Attending Surgeon: Michail Jewels  Assistant(s): Korst    Anesthesia Type: General  Estimated Blood Loss:  less than 50 ml  Blood Given: None  Fluids Given: Per anesthesia  Complications (not routinely expected or not inherent to difficulty/nature of procedure): Patient with extensive pulmonary comorbidities  Characteristic Event (routinely expected or inherent to the difficulty/nature of the procedure): See operative report  Did the use of current and/or prior Anticoagulants impact the outcome of the case? No  Wound Class: Clean Wound: Uninfected operative wounds in which no inflammation occurred    Tubes: None  Drains: None  Specimens/ Cultures: None  Implants: Globus           Disposition: PACU - hemodynamically stable.  Condition: stable    Sharlyne Pacas, MD  Neurological Surgery, PGY-2

## 2023-02-18 NOTE — Care Plan (Signed)
Respiratory Care Services  ICU Care Plan    Patient Name:  [name] [name], [name] [name]  Date of Service:  02/18/2023  Hospital Day:     LOS: 3 days       Oxygen Devices:  Nasal Cannula: 6 liters    Breathing Treatments:   Duoneb QID  Albuterol Q4 PRN (gave one).    Summary/Plan of Care:  Plans to continue with scheduled breathing treatments, might require an increase in frequency. Plans to wean oxygen as tolerated.       Charlynne Pander Hint  02/18/2023    Problem: Adult Inpatient Plan of Care  Goal: Plan of Care Review  Outcome: Ongoing (see interventions/notes)  Goal: Patient-Specific Goal (Individualized)  Outcome: Ongoing (see interventions/notes)

## 2023-02-18 NOTE — Consults (Signed)
[facility]  Neurosurgery Consult  Follow Up Note    [name] [name], [place], 57 y.o. male  Date of Service: 02/18/2023  Date of Birth:  [birth year]    Hospital Day:  LOS: 3 days     Chief Complaint:  Weakness  Subjective: Awaking from anesthesia, initially had difficulties with oxygenation pre extubation    Objective:  Temperature: 36.5 C (97.7 F)  Heart Rate: (!) 115  BP (Non-Invasive): (!) 156/85  Respiratory Rate: 19  SpO2: 94 %  Drowsy  PERL  Facial activation symmetric  MAEx4    Incisional exofin    Assessment/Recommendations:  [name] [name] is a 56 y.o. male with PMH of COPD and renal nephrolithiasis who presents with a 2 month history of BUE and BLE weakness and paraesthesias, acutely worsening over the past 2 weeks.     -- Vanc/ancef periop x24h  -- Recommend SDU given pulmonary concerns  -- OK for DVT ppx 48h postop  -- Postoperative XR C spine ordered  -- No activity restrictions from a neurosurgery perspective  -- Remainder per primary    Imaging:   -- XR C spine upright: ORDERED  -- MRI C spine (02/15/23): severe C3-C4 stenosis               -- XR cervical upright (02/15/23): stable alignment               -- CT CAP w (02/15/23): unremarkable other than right-sided non-obstructive renal calculus               -- MRI thoracic wwo (02/14/23): unremarkable               -- MRI cervical wwo (02/14/23): severe spinal canal stenosis at C3-4               -- MRI brain wwo (02/14/23): nonspecific white matter disease that could be small vessel changes or demyelinating process    Please call or page if further concerns    Sharlyne Pacas, MD  Neurological Surgery, PGY-2    Late entry for 02/18/23. I saw and examined the patient.  I reviewed the resident's note.  I agree with the findings and plan of care as documented in the resident's note.  Any exceptions/additions are edited/noted.    Mariann Laster, MD

## 2023-02-18 NOTE — Anesthesia Transfer of Care (Signed)
ANESTHESIA TRANSFER OF CARE   [name] [name] is a 57 y.o. ,male, Weight: 123 kg (271 lb 2.7 oz)   had Procedure(s) with comments:  DISCECTOMY SPINE CERVICAL ANTERIOR MICRO WITH FUSION AND INSTRUMENTATION 1 LEVEL - C3-4  performed  02/18/23   Primary Service: Murtis Sink, *    Past Medical History:   Diagnosis Date    COPD (chronic obstructive pulmonary disease) (CMS HCC)     Gout     Kidney stones     Low back pain       Allergy History as of 02/18/23       NAPROXEN         Noted Status Severity Type Reaction    06/11/22 1117 Ward, [place], MA 06/11/22 Active Low  Nausea/ Vomiting                  I completed my transfer of care / handoff to the receiving personnel during which we discussed:  Access, Airway, All key/critical aspects of case discussed, Analgesia, Antibiotics, Expectation of post procedure, Fluids/Product, Gave opportunity for questions and acknowledgement of understanding, Labs and PMHx      Post Location: PACU                        Additional Info:Pt to PACU. Transported on 15L O2 via NRB. Report to PACU RN. VS returned to pt's baseline. Pt awake, alert.                                       Last OR Temp: Temperature: 36.5 C (97.7 F)  ABG:  PH (ARTERIAL)   Date Value Ref Range Status   02/18/2023 7.27 (L) 7.35 - 7.45 Final     PCO2 (ARTERIAL)   Date Value Ref Range Status   02/18/2023 77 (HH) 35 - 45 mm/Hg Final     PCO2 (VENOUS)   Date Value Ref Range Status   02/15/2023 60 (H) 41 - 51 mm/Hg Final     PO2 (ARTERIAL)   Date Value Ref Range Status   02/18/2023 166 (H) 83 - 108 mm/Hg Final     PO2 (VENOUS)   Date Value Ref Range Status   02/15/2023 51 mm/Hg Final     Comment:     No ranges were established for venous pO2 as manufacturer does not recommend venous sample for this test.     SODIUM   Date Value Ref Range Status   02/18/2023 133 (L) 136 - 145 mmol/L Final     POTASSIUM   Date Value Ref Range Status   02/18/2023 4.3 3.5 - 5.1 mmol/L Final     KETONES   Date Value Ref Range Status    02/15/2023 Negative Negative mg/dL Final     WHOLE BLOOD POTASSIUM   Date Value Ref Range Status   02/18/2023 4.3 3.5 - 5.1 mmol/L Final     CHLORIDE   Date Value Ref Range Status   02/18/2023 101 98 - 107 mmol/L Final     CALCIUM   Date Value Ref Range Status   02/18/2023 9.6 8.6 - 10.2 mg/dL Final     Comment:     Gadolinium-containing contrast can interfere with calcium measurement.     CALCIUM OXALATE CRYSTALS   Date Value Ref Range Status   12/05/2022 Moderate (A) None, Slight /hpf Final  Calculated P Axis   Date Value Ref Range Status   02/15/2023 68 degrees Final     Calculated R Axis   Date Value Ref Range Status   02/15/2023 43 degrees Final     Calculated T Axis   Date Value Ref Range Status   02/15/2023 72 degrees Final     IONIZED CALCIUM   Date Value Ref Range Status   02/18/2023 1.20 1.15 - 1.33 mmol/L Final     LACTATE   Date Value Ref Range Status   02/18/2023 0.9 <=1.9 mmol/L Final     HEMOGLOBIN   Date Value Ref Range Status   02/18/2023 16.7 12.0 - 18.0 g/dL Final     OXYHEMOGLOBIN   Date Value Ref Range Status   02/18/2023 97.7 (H) 90.0 - 95.0 % Final     CARBOXYHEMOGLOBIN   Date Value Ref Range Status   02/18/2023 1.3 <=3.0 % Final     MET-HEMOGLOBIN   Date Value Ref Range Status   02/18/2023 0.8 <=1.5 % Final     BASE EXCESS   Date Value Ref Range Status   02/15/2023 4.0 (H) 0.0 - 3.0 mmol/L Final     BASE EXCESS (ARTERIAL)   Date Value Ref Range Status   02/18/2023 5.0 (H) 0.0 - 3.0 mmol/L Final     BICARBONATE (ARTERIAL)   Date Value Ref Range Status   02/18/2023 28.8 (H) 21.0 - 28.0 mmol/L Final     BICARBONATE (VENOUS)   Date Value Ref Range Status   02/15/2023 27.7 22.0 - 29.0 mmol/L Final     %FIO2 (VENOUS)   Date Value Ref Range Status   02/15/2023 44.0 % Final     Airway:* No LDAs found *  Blood pressure (!) 156/85, pulse (!) 115, temperature 36.5 C (97.7 F), resp. rate 19, height 1.88 m (6' 2.02"), weight 123 kg (271 lb 2.7 oz), SpO2 94%.

## 2023-02-18 NOTE — Nurses Notes (Addendum)
Pt sitting up in chair. Denies any needs or concern at this time

## 2023-02-19 ENCOUNTER — Ambulatory Visit (HOSPITAL_COMMUNITY): Payer: Self-pay

## 2023-02-19 DIAGNOSIS — M47812 Spondylosis without myelopathy or radiculopathy, cervical region: Secondary | ICD-10-CM

## 2023-02-19 DIAGNOSIS — M4802 Spinal stenosis, cervical region: Principal | ICD-10-CM

## 2023-02-19 DIAGNOSIS — M5412 Radiculopathy, cervical region: Secondary | ICD-10-CM

## 2023-02-19 DIAGNOSIS — R200 Anesthesia of skin: Secondary | ICD-10-CM

## 2023-02-19 DIAGNOSIS — Z9889 Other specified postprocedural states: Secondary | ICD-10-CM

## 2023-02-19 DIAGNOSIS — J9601 Acute respiratory failure with hypoxia: Secondary | ICD-10-CM

## 2023-02-19 DIAGNOSIS — M542 Cervicalgia: Secondary | ICD-10-CM

## 2023-02-19 DIAGNOSIS — G8929 Other chronic pain: Secondary | ICD-10-CM

## 2023-02-19 DIAGNOSIS — J449 Chronic obstructive pulmonary disease, unspecified: Secondary | ICD-10-CM

## 2023-02-19 DIAGNOSIS — M109 Gout, unspecified: Secondary | ICD-10-CM

## 2023-02-19 DIAGNOSIS — E669 Obesity, unspecified: Secondary | ICD-10-CM

## 2023-02-19 DIAGNOSIS — Z6834 Body mass index (BMI) 34.0-34.9, adult: Secondary | ICD-10-CM

## 2023-02-19 LAB — DRUG SCREEN, WITH CONFIRMATION, URINE
AMPHETAMINES, URINE: NEGATIVE
BARBITURATES URINE: NEGATIVE
BENZODIAZEPINES URINE: POSITIVE — AB
BUPRENORPHINE URINE: POSITIVE — AB
CANNABINOIDS URINE: NEGATIVE
COCAINE METABOLITES URINE: NEGATIVE
CREATININE RANDOM URINE: 164 mg/dL (ref >=20)
ECSTASY/MDMA URINE: NEGATIVE
FENTANYL, RANDOM URINE: POSITIVE — AB
METHADONE URINE: NEGATIVE
OPIATES URINE (LOW CUTOFF): POSITIVE — AB
OXYCODONE URINE: POSITIVE — AB

## 2023-02-19 LAB — POC BLOOD GLUCOSE (RESULTS)
GLUCOSE, POC: 106 mg/dl — ABNORMAL HIGH (ref 70–105)
GLUCOSE, POC: 158 mg/dl — ABNORMAL HIGH (ref 70–105)
GLUCOSE, POC: 133 mg/dl — ABNORMAL HIGH (ref 70–105)

## 2023-02-19 MED ORDER — ACETAMINOPHEN 325 MG TABLET
650.0000 mg | ORAL_TABLET | Freq: Four times a day (QID) | ORAL | Status: DC
Start: 2023-02-19 — End: 2023-02-20
  Administered 2023-02-19 (×2): 0 mg via ORAL
  Administered 2023-02-19: 650 mg via ORAL
  Administered 2023-02-20 (×3): 0 mg via ORAL
  Filled 2023-02-19 (×3): qty 2

## 2023-02-19 MED ORDER — OXYCODONE 10 MG TABLET
10.0000 mg | ORAL_TABLET | ORAL | Status: DC | PRN
Start: 2023-02-19 — End: 2023-02-20

## 2023-02-19 MED ORDER — KETOROLAC 30 MG/ML (1 ML) INJECTION SOLUTION
30.0000 mg | Freq: Four times a day (QID) | INTRAMUSCULAR | Status: DC
Start: 2023-02-19 — End: 2023-02-20
  Administered 2023-02-19 – 2023-02-20 (×5): 30 mg via INTRAVENOUS
  Filled 2023-02-19 (×5): qty 1

## 2023-02-19 MED ORDER — SENNOSIDES 8.6 MG-DOCUSATE SODIUM 50 MG TABLET
2.0000 | ORAL_TABLET | Freq: Two times a day (BID) | ORAL | Status: DC
Start: 2023-02-19 — End: 2023-02-20
  Administered 2023-02-19 – 2023-02-20 (×3): 2 via ORAL
  Filled 2023-02-19 (×3): qty 2

## 2023-02-19 MED ORDER — OXYCODONE 5 MG TABLET
15.0000 mg | ORAL_TABLET | ORAL | Status: DC | PRN
Start: 2023-02-19 — End: 2023-02-20
  Administered 2023-02-19 – 2023-02-20 (×5): 15 mg via ORAL
  Filled 2023-02-19 (×5): qty 1

## 2023-02-19 MED ORDER — POLYETHYLENE GLYCOL 3350 17 GRAM ORAL POWDER PACKET
34.0000 g | Freq: Two times a day (BID) | ORAL | Status: DC
Start: 2023-02-19 — End: 2023-02-20
  Administered 2023-02-19 – 2023-02-20 (×3): 34 g via ORAL
  Filled 2023-02-19 (×3): qty 2

## 2023-02-19 NOTE — Transitional Care (Signed)
[facility]  Transitional Care Coordination  Initial Assessment    Name: [name] [name]  Address: [address]  [place] [address]  Phone: [phone]   Date of Birth: 1966-05-07  Date of Service: 02/19/2023      PCP information:  Cecilie Kicks, DO  Phone: [phone]  Fax: [phone]      Chart Review:  Patient Status: Chart Review  30 Day Readmission: No  MyChart Status: Active             Preferred Pharmacy       [facility] [place] [place] - [address]    [address] [place] [address]    Phone: [phone] Fax: [phone]    Hours: Not open 24 hours           02/19/23 0835   Assessment Type   Assessment type Initial Assessment;PCP verification   Initial Assessment   Transportation for healthcare appointments Family transports   Obtained verbal consent to speak to lay caregiver Yes   Initial Assessment 1st Atttempt;Completed   Obtained verbal consent to contact and schedule a PCP follow up appointment within 7-14 days of discharge? Yes   Discharge to home address listed in chart? Yes   Is home address a P.O Box? No   Apt Preferences No Preference   Time preference Afternoon   Obtained verbal consent to follow up with the [facility] when necessary Yes  (telehealth, if pcp doesn't have any openings)   Name of Lay Caregiver Marty Uy mobile [phone]   Relationship of lay caregiver Daughter   PCP Verification   PCP Verficiation Completed   PCP is a Scientist, physiological   Last PCP apt 01/09/23         Discussed role of Transitional Care Coordination Team and informed of contact within 2 business days of discharge to assess follow-up plan.      Hospitalist Service Coordinator: Weber Cooks, [place]  Phone Ext: [phone]

## 2023-02-19 NOTE — Progress Notes (Signed)
[facility]  Medicine Progress Note    [name]  Date of service: 02/19/2023  Date of Admission:  02/15/2023    Hospital Day:  LOS: 4 days       Assessment/ Plan:   Active Hospital Problems    Diagnosis    Primary Problem: Cervical stenosis of spine       [name] [name] is a 57 y.o., male with PMH significant for COPD, gout who presents with progressive weakness.     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  - Neuro consulted, signed off as no further recs  - continue PRN narcotics for pain control  - PT OT - OK for home with assist     Acute hypoxic respiratory failure, likely chronic element and secondary to COPD - reported desats for about the last 2 months, unclear etiology, CT Chest without acute pathology, no wheezing on exam, TTE March 2024 without significant abnormality  - continue O2  - completed 5 days of steroids  - PRN albuterol  - pulm evaluated, placed on Symbicort and Spiriva  - Will get O2 challenge today  - Will need script for inhalers at discharge and follow up in pulm clinic     Gout - allopurinol     Chronic pain - 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 Patient ideally would like to get off Suboxone entirely     Obesity    Body mass index is 34.8 kg/m.  Protein Calorie Malnutrition  -Patient meets criteria based on the following clinical characteristics:     -Nutrition consulted     -MNT protocol ordered  -monitor bowel functions  Lab Results   Component Value Date    ALBUMIN 3.8 02/15/2023        Hardware (lines, foley's, tubes):  Patient Lines/Drains/Airways Status       Active Line / Dialysis Catheter / Dialysis Graft / Drain / Airway / Wound       Name  Placement date Placement time Site Days    Peripheral IV Right;Posterior Dorsal Metacarpals  (top of hand) 02/18/23  0747  -- 1    Wound  Incision Right Neck 02/18/23  0827  -- 1                     DVT/PE Prophylaxis: lovenox    Disposition Planning: TBD      Murtis Sink, MD  [facility]  Assistant Professor of Medicine  Pager [phone]    On 02/19/2023 I spent a total visit time of 51 minutes. Time included review of tests and ordering tests, obtaining/reviewing history, examining the patient, communicating with consultants, documenting clinical information and counseling the patient and/or family regarding the diagnosis and management plan and coordination of care involved services directly related to patient care.    FOLLOW UP NOTE LEVEL 3 (TOTAL TIME > 50 MINUTES) (96045)    ____________________________________________________________________________________________________________________________________________________________________    Chief Complaint: resp failure, central cord syndrome  Subjective: NAEO. Sitting in chair. Reports neck stiffness but was able to walk down the hall with PT. States he is eager to go home and feels he will do well there. Denied cp, sob,  nausea, vomiting.     Vital Signs:  Temp  Avg: 36.8 C (98.2 F)  Min: 36.6 C (97.9 F)  Max: 37 C (98.6 F)    Pulse  Avg: 84.8  Min: 68  Max: 100 BP  Min: 112/87  Max: 162/87   Resp  Avg: 22.9  Min: 17  Max: 28 SpO2  Avg: 91.9 %  Min: 89 %  Max: 95 %          Input/Output    Intake/Output Summary (Last 24 hours) at 02/19/2023 1125  Last data filed at 02/19/2023 0814  Gross per 24 hour   Intake 4110 ml   Output 3050 ml   Net 1060 ml    I/O last shift:  04/16 0700 - 04/16 1859  In: 50   Out: 500 [Urine:500]   acetaminophen (TYLENOL) tablet, 650 mg, Oral, Q6H  albuterol (PROVENTIL) 2.5 mg / 3 mL (0.083%) neb solution, 2.5 mg, Nebulization, Q4H PRN  allopurinol (ZYLOPRIM) tablet, 100 mg, Oral, 2x/day  budesonide-formoterol (SYMBICORT) 160  mcg-4.5 mcg per inhalation oral inhaler - "Nursing to administer", 2 Puff, Inhalation, 2x/day  cyclobenzaprine (FLEXERIL) tablet, 5 mg, Oral, Q8H PRN  D5W 250 mL flush bag, , Intravenous, Q15 Min PRN  D5W 250 mL flush bag, , Intravenous, Q15 Min PRN  D5W 250 mL flush bag, , Intravenous, Q15 Min PRN  diazePAM (VALIUM) 5 mg/mL injection, 5 mg, Intravenous, Q30 Min PRN  [Held by provider] enoxaparin PF (LOVENOX) 40 mg/0.4 mL SubQ injection, 40 mg, Subcutaneous, Daily  gabapentin (NEURONTIN) capsule, 300 mg, Oral, 2x/day  guaiFENesin 100mg  per 5mL oral liquid - for cough (expectorant), 200 mg, Oral, Q4H PRN  HYDROmorphone (DILAUDID) 0.5 mg/0.5 mL injection, 0.5 mg, Intravenous, Q3H PRN  ketorolac (TORADOL) 30 mg/mL injection, 30 mg, Intravenous, Q6HRS  nicotine (NICODERM CQ) transdermal patch (mg/24 hr), 21 mg, Transdermal, Daily  NS 250 mL flush bag, , Intravenous, Q15 Min PRN  NS 250 mL flush bag, , Intravenous, Q15 Min PRN  NS 250 mL flush bag, , Intravenous, Q15 Min PRN  NS flush syringe, 2-6 mL, Intracatheter, Q8HRS  NS flush syringe, 2-6 mL, Intracatheter, Q1 MIN PRN  NS flush syringe, 2-6 mL, Intracatheter, Q8HRS  NS flush syringe, 2-6 mL, Intracatheter, Q1 MIN PRN  NS flush syringe, 2-6 mL, Intracatheter, Q8HRS  NS flush syringe, 2-6 mL, Intracatheter, Q1 MIN PRN  ondansetron (ZOFRAN) 2 mg/mL injection, 4 mg, Intravenous, Q6H PRN  oxyCODONE (ROXICODONE) immediate release tablet, 10 mg, Oral, Q4H PRN   Or  oxyCODONE (ROXICODONE) IR tablet 15 mg, 15 mg, Oral, Q4H PRN  polyethylene glycol (MIRALAX) oral packet, 34 g, Oral, 2x/day  sennosides-docusate sodium (SENOKOT-S) 8.6-50mg  per tablet, 2 Tablet, Oral, 2x/day  SSIP insulin lispro (HumaLOG) 100 units/mL injection, 0-12 Units, Subcutaneous, 4x/day PRN  tiotropium bromide (SPIRIVA RESPIMAT) 2.5 mcg per inhalation oral inhaler - "Nursing to administer", 2 Puff, Inhalation, Daily          Physical Exam  BP 112/87   Pulse 69   Temp 36.7 C (98.1 F)   Resp 20   Ht  1.88 m (6' 2.02")   Wt 123 kg (271 lb 2.7 oz)   SpO2 93%   BMI 34.80 kg/m     Constitutional: AOx 3, NAD, appears age appropriate  Respiratory: improved aeration, CTAB. No wheeze or rales.   Cardiovascular: rrr, S1, S2 normal, no murmur, click, rub or gallop  Gastrointestinal: Soft, NT, Bowel sounds normal, non-distended  Musculoskeletal: Head atraumatic and normocephalic.  No significant edema.   Integumentary:  Skin warm and dry, No rashes and No lesions  Neurologic: moving all extremities, No tremor  Psychiatric: Normal affect, behavior    Labs:    CBC  Diff   Lab Results   Component Value Date/Time    WBC 11.0 02/18/2023 02:32 AM    HGB 16.5 02/18/2023 02:32 AM    HCT 51.6 02/18/2023 02:32 AM    PLTCNT 213 02/18/2023 02:32 AM    RBC 5.25 02/18/2023 02:32 AM    MCV 98.3 02/18/2023 02:32 AM    MCHC 32.0 02/18/2023 02:32 AM    MCH 31.4 02/18/2023 02:32 AM    RDW 14.1 02/14/2023 05:32 PM    MPV 9.4 02/18/2023 02:32 AM    Lab Results   Component Value Date/Time    PMNS 59.9 02/18/2023 02:32 AM    LYMPHOCYTES 4 02/16/2023 05:19 AM    EOSINOPHIL 0 02/16/2023 05:19 AM    MONOCYTES 12.9 02/18/2023 02:32 AM    BASOPHILS 0.3 02/18/2023 02:32 AM    BASOPHILS <0.10 02/18/2023 02:32 AM    PMNABS 6.58 02/18/2023 02:32 AM    LYMPHSABS 2.87 02/18/2023 02:32 AM    EOSABS <0.10 02/18/2023 02:32 AM    MONOSABS 1.41 (H) 02/18/2023 02:32 AM    BASABS 0.09 06/11/2022 12:22 PM          Comprehensive Metabolic Profile    Lab Results   Component Value Date/Time    SODIUM 133 (L) 02/18/2023 08:49 AM    POTASSIUM 4.3 02/18/2023 02:32 AM    CHLORIDE 101 02/18/2023 08:49 AM    CO2 31 (H) 02/18/2023 02:32 AM    ANIONGAP 9 02/18/2023 02:32 AM    BUN 19 02/18/2023 02:32 AM    CREATININE 0.81 02/18/2023 02:32 AM    GLUCOSE 118 02/18/2023 08:49 AM    Lab Results   Component Value Date/Time    CALCIUM 9.6 02/18/2023 02:32 AM    PHOSPHORUS 3.9 02/17/2023 03:48 AM    ALBUMIN 3.8 02/15/2023 08:46 AM    TOTALPROTEIN 7.7 02/15/2023 08:46 AM     ALKPHOS 88 02/15/2023 08:46 AM    AST 23 02/15/2023 08:46 AM    ALT 23 02/15/2023 08:46 AM    TOTBILIRUBIN 0.4 02/15/2023 08:46 AM        CARDIAC MARKERS  Lab Results   Component Value Date    CPK 97 02/15/2023            Radiology:      Results for orders placed during the hospital encounter of 02/15/23    CT CHEST ABDOMEN PELVIS W IV CONTRAST 02/15/2023 10:07 AM    Impression  1. No evidence of acute abnormality in the chest, abdomen, and pelvis.  2. Nonobstructing right-sided nephrolithiasis.

## 2023-02-19 NOTE — Discharge Instructions (Signed)
If you have any questions regarding your discharge instructions, please call [phone]). Our transitions nurses are available to assist you Monday thru Friday from 8 am-4:30 pm.  If this is after 4:30 pm, a weekend, or holiday please call and ask to speak to the doctor on call for the Hospitalist team.  In case of an emergency please call 911.

## 2023-02-19 NOTE — Consults (Signed)
[facility]  Neurosurgery Consult  Follow Up Note    [name] [name], [name] [name], 57 y.o. male  Date of Service: 02/19/2023  Date of Birth:  04-Feb-1966    Assessment/Recommendations:  [name] [name] is a 57 y.o. 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 Patient will follow up in 4 weeks with XR (ordered).    -- Pt has Exofin for incision    -- Please let us know if patient will be still admitted at this time so we can re-order inpatient.     -- Imaging:   -- XR Upright cervical (02/18/23): postop changes    Thank you for the consult, please call with questions or concerns.       Joette Catching, PA-C 02/19/2023, 15:24  Department of Neurosurgery- Spine  Pager [phone], Phone [phone]  I independently of the faculty provider spent a total of 10 minutes in direct/indirect care of this patient including initial evaluation, review of laboratory, radiology, diagnostic studies, review of medical record, patient/ family education, order entry and coordination of care.

## 2023-02-19 NOTE — Care Management Notes (Addendum)
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]    Assessment:      02/19/23 1055   Assessment Details   Assessment Type Continued Assessment   Date of Care Management Update 02/19/23   Date of Next DCP Update 02/22/23   Insurance Information/Type   Insurance type Medicaid  (Health Plan Medicaid)   Care Management Plan   Discharge Planning Status plan in progress   Discharge plan discussed with: Patient   CM will evaluate for rehabilitation potential yes   Patient choice offered to patient/family yes   Form for patient choice reviewed/signed and on chart yes   Facility or Agency Preferences [facility]   Discharge Needs Assessment   Discharge Facility/Level of Care Needs Home with DME (code 1)       MSW met with patient at bedside to discuss discharge planning and to complete freedom of choice.    PT is recommending home with assist, home with home health. OT is recommending home with 24/7 assistance, home with home health.    Provided patient CarePort listing with CMS ratings for DME serving the [address] zip code.    [name] [name] declined a referral to [facility] stating that he does not believe he needs it.    [name] [name] may need Home Oxygen arranged at time of discharge.    Freedom of choice offered, obtained, signed, and placed on chart for [facility].     Discharge Plan:  Home with DME (code 1)    The patient will continue to be evaluated for developing discharge needs.     Case Manager: Gae Bon, SOCIAL WORKER  Phone: [phone]

## 2023-02-19 NOTE — Nurses Notes (Signed)
02/19/23 1452        Home Oxygen Assessment   O2 sat on Oxygen at rest 90   O2 sat on NC while Ambulating 86   Oxygen Amount (LPM) 4

## 2023-02-19 NOTE — Nurses Notes (Signed)
Pt resting in chair. PRN Oxycodone given for complains of pain. IV antibiotics continued. Vitals per flowsheet. Call bell within reach. Will monitor.

## 2023-02-19 NOTE — Consults (Signed)
[facility]  Neurosurgery Consult  Follow Up Note    [name] [name], [name] [name], 57 y.o. male  Date of Service: 02/19/2023  Date of Birth:  1966/10/22    Hospital Day:  LOS: 4 days     Chief Complaint:  myelopathy  Subjective:   Doing well after surgery, no acute abnormalities    Objective:  Temperature: 36.7 C (98 F)  Heart Rate: 80  BP (Non-Invasive): (!) 162/87  Respiratory Rate: (!) 22  SpO2: 90 %  General: Appears stated age, NAD  Cardio: Radial pulses intact bilaterally  ENT: Trachea midline  Respiratory: Regular respirations  Skin: Skin pink    Neurologic Exam:  A&Ox3  GCS Fluent speech  Appropriate fund of knowledge  Appropriate attention span & concentration  Appropriate recent and remote memory  CN 2 PERRL  CN EOMI  CN 7 Face symmetric  CN 8 Hearing grossly intact  CN 11 shrug symmetric  CN 12 Tongue midline  Muscle Strength 5/5 BUE except 4-4+/5 bilateral HG, 5/5 BLE except 4/5 bilateral HF (effort limited)  Muscle tone WNL  SILT BUE and BLE  +hoffman bilaterally    Incision with exofin    Assessment/Recommendations:  [name] [name] is a 57 y.o. 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 Lillard Anes, MD  PGY-3  Neurosurgery  02/19/2023,06:31      Late entry for 02/19/23. I saw and examined the patient.  I reviewed the resident's note.  I agree with the findings and plan of care as documented in the resident's note.  Any exceptions/additions are edited/noted.    Mariann Laster, MD

## 2023-02-19 NOTE — Nurses Notes (Signed)
02/19/23 1141        Home Oxygen Assessment   O2 sat on Room Air at Rest 87   O2 sat on Oxygen at rest 90   Oxygen Amount (LPM) 6     Pt failed oxygen challenge. Service notified. Will monitor.

## 2023-02-19 NOTE — Care Plan (Signed)
Problem: Gas Exchange Impaired  Goal: Optimal Gas Exchange  Outcome: Ongoing (see interventions/notes)     Problem: Infection  Goal: Absence of Infection Signs and Symptoms  Outcome: Ongoing (see interventions/notes)  Intervention: Prevent or Manage Infection  Recent Flowsheet Documentation  Taken 02/18/2023 2018 by Streety Gourd, RN  Fever Reduction/Comfort Measures:   lightweight bedding   lightweight clothing     Problem: Pain Acute  Goal: Optimal Pain Control and Function  Outcome: Ongoing (see interventions/notes)     Problem: Acute Rehab Services Goal & Intervention Plan  Goal: Bathing Goal  Description: Stand Alone Therapy Goal  Outcome: Ongoing (see interventions/notes)  Goal: Bed Mobility Goal  Description: Stand Alone Therapy Goal  Outcome: Ongoing (see interventions/notes)  Goal: Caregiver Training Goal  Description: Stand Alone Therapy Goal  Outcome: Ongoing (see interventions/notes)  Goal: Cognition Goal  Description: Stand Alone Therapy Goal  Outcome: Ongoing (see interventions/notes)  Goal: Cognition Goals, SLP  Description: Stand Alone Therapy Goal  Outcome: Ongoing (see interventions/notes)  Goal: Communication Goals, SLP  Description: Stand Alone Therapy Goal  Outcome: Ongoing (see interventions/notes)  Goal: Dysphagia Goals, SLP  Description: Stand Alone Therapy Goal  Outcome: Ongoing (see interventions/notes)  Goal: Eating Self-Feeding Goal  Description: Stand Alone Therapy Goal  Outcome: Ongoing (see interventions/notes)  Goal: Gait Training Goal  Description: Stand Alone Therapy Goal  Outcome: Ongoing (see interventions/notes)  Goal: Grooming Goal  Description: Stand Alone Therapy Goal  Outcome: Ongoing (see interventions/notes)  Goal: Home Management Goal  Description: Stand Alone Therapy Goal  Outcome: Ongoing (see interventions/notes)  Goal: Interprofessional Goal  Description: Stand Alone Therapy Goal  Outcome: Ongoing (see interventions/notes)  Goal: LB Dressing  Goal  Description: Stand Alone Therapy Goal  Outcome: Ongoing (see interventions/notes)  Goal: Occupational Therapy Goals  Description: Stand Alone Therapy Goal  Outcome: Ongoing (see interventions/notes)  Goal: Physical Therapy Goal  Description: Stand Alone Therapy Goal  Outcome: Ongoing (see interventions/notes)  Goal: Range of Motion Goal  Description: Stand Alone Therapy Goal  Outcome: Ongoing (see interventions/notes)  Goal: Strength Goal  Description: Stand Alone Therapy Goal  Outcome: Ongoing (see interventions/notes)  Goal: Toileting Goal  Description: Stand Alone Therapy Goal  Outcome: Ongoing (see interventions/notes)  Goal: Goal Transfer Training  Description: Stand Alone Therapy Goal  Outcome: Ongoing (see interventions/notes)  Goal: UB Dressing Goal  Description: Stand Alone Therapy Goal  Outcome: Ongoing (see interventions/notes)     Problem: Adult Inpatient Plan of Care  Goal: Plan of Care Review  Outcome: Ongoing (see interventions/notes)  Goal: Patient-Specific Goal (Individualized)  Outcome: Ongoing (see interventions/notes)  Goal: Absence of Hospital-Acquired Illness or Injury  Outcome: Ongoing (see interventions/notes)  Intervention: Identify and Manage Fall Risk  Recent Flowsheet Documentation  Taken 02/18/2023 2018 by Neuwirth Gourd, RN  Safety Promotion/Fall Prevention:   activity supervised   safety round/check completed  Intervention: Prevent Skin Injury  Recent Flowsheet Documentation  Taken 02/18/2023 2018 by Brockmann Gourd, RN  Body Position: sitting  Intervention: Prevent and Manage VTE (Venous Thromboembolism) Risk  Recent Flowsheet Documentation  Taken 02/18/2023 2018 by Rinella Gourd, RN  VTE Prevention/Management:   ambulation promoted   sequential compression devices on  Goal: Optimal Comfort and Wellbeing  Outcome: Ongoing (see interventions/notes)  Intervention: Provide Person-Centered Care  Recent Flowsheet Documentation  Taken 02/18/2023 2018  by Toothman Gourd, RN  Trust Relationship/Rapport:   care explained   choices provided  Goal: Rounds/Family Conference  Outcome: Ongoing (see interventions/notes)

## 2023-02-19 NOTE — Care Plan (Signed)
[facility]  Rehabilitation Services  Physical Therapy Progress Note      Patient Name: CYLE KENYON  Date of Birth: 1966/10/29  Height:  188 cm (6' 2.02")  Weight:  123 kg (271 lb 2.7 oz)  Room/Bed: 03/A  Payor: HEALTH PLAN MEDICAID / Plan: HEALTH PLAN MEDICAID / Product Type: Medicaid MC /     Assessment:     Mr Allender is now s/p  C3-4 ACDF. He was able to transfer to standing and ambulate functional distances for home with stand by to CG assist. Pt states he will have 24/7 assist at home. Recommend home with 24/7 at d/c.    Discharge Needs:   Equipment Recommendation: none anticipated       Discharge Disposition: home with 24/7 assistance, home with outpatient services    JUSTIFICATION OF DISCHARGE RECOMMENDATION   Based on current diagnosis, functional performance prior to admission, and current functional performance, this patient requires continued PT services in home with 24/7 assistance, home with outpatient services in order to achieve significant functional improvements in these deficit areas: aerobic capacity/endurance, gait, locomotion, and balance, motor function, muscle performance, neuromuscular, posture, ROM (range of motion).      Plan:   Continue to follow patient according to established plan of care.  The risks/benefits of therapy have been discussed with the patient/caregiver and he/she is in agreement with the established plan of care.     Subjective & Objective:        02/19/23 6387   Therapist Pager   PT Assigned/ Pager # Sameeha Rockefeller [phone]   Rehab Session   Document Type therapy progress note (daily note)   PT Visit Date 02/19/23   Total PT Minutes: 14   Patient Effort good   Symptoms Noted During/After Treatment none   General Information   Patient Profile Reviewed yes   Medical Lines PIV Line   Respiratory Status nasal cannula   Existing Precautions/Restrictions fall precautions   Mutuality/Individual Preferences   Individualized Care Needs up with assist x 1   Pre Treatment Status    Pre Treatment Patient Status Patient sitting in bedside chair or w/c   Support Present Pre Treatment  Nurse present   Communication Pre Treatment  Nurse   Cognitive Assessment/Interventions   Behavior/Mood Observations behavior appropriate to situation, WNL/WFL   Attention WNL/WFL   Follows Commands WFL   Vital Signs   O2 Delivery Pre Treatment supplemental O2   O2 Delivery Post Treatment supplemental O2   Pain Assessment   Pre/Posttreatment Pain Comment no c/o pain   Transfer Assessment/Treatment   Sit-Stand Independence stand-by assistance   Stand-Sit Independence stand-by assistance   Transfer Safety Issues balance decreased during turns   Transfer Impairments balance impaired;endurance;strength decreased   Gait Assessment/Treatment   Independence  contact guard assist   Distance in Feet 100   Deviations  step length decreased;stride length decreased   Safety Issues  balance decreased during turns;step length decreased   Impairments  balance impaired;endurance;strength decreased   Balance Skill Training   Sitting Balance: Static good balance   Sit-to-Stand Balance fair balance   Standing Balance: Static fair balance   Standing Balance: Dynamic fair - balance   Systems Impairment Contributing to Balance Disturbance musculoskeletal;neuromuscular   Post Treatment Status   Post Treatment Patient Status Patient sitting in bedside chair or w/c;Call light within reach   Support Present Post Treatment  None   Plan of Care Review   Plan Of Care Reviewed With patient  Basic Mobility Am-PAC/6Clicks Score (APPROVED Staff)   Turning in bed without bedrails 4   Lying on back to sitting on edge of flat bed 4   Moving to and from a bed to a chair 4   Standing up from chair 4   Walk in room 3   Climbing 3-5 steps with railing 3   6 Clicks Raw Score total 22   Standardized (t-scale) score 47.4   Patient Mobility Goal (JHHLM) 8- Walk 250 feet or more 3X/day   Exercise/Activity Level Performed 7- Walked 25 feet or more   Physical  Therapy Clinical Impression   Assessment Mr Sterbenz is now s/p  C3-4 ACDF. He was able to transfer to standing and ambulate functional distances for home with stand by to CG assist. Pt states he will have 24/7 assist at home. Recommend home with 24/7 at d/c.   Criteria for Skilled Therapeutic yes   Anticipated Equipment Needs at Discharge (PT) none anticipated   Anticipated Discharge Disposition home with 24/7 assistance;home with outpatient services         Therapist:   Vita Barley, PT   Pager #: [phone]

## 2023-02-19 NOTE — Nurses Notes (Signed)
Pt sitting up in his chair. Pt still having incision site, neck and headache. Decline any pain medication at this time.

## 2023-02-19 NOTE — Nurses Notes (Signed)
Patient showered with assist from [place].  Incision and IV covered and remained CDI.  Pt tolerated well.

## 2023-02-20 ENCOUNTER — Other Ambulatory Visit: Payer: Self-pay

## 2023-02-20 DIAGNOSIS — S14123A Central cord syndrome at C3 level of cervical spinal cord, initial encounter: Secondary | ICD-10-CM

## 2023-02-20 DIAGNOSIS — X58XXXA Exposure to other specified factors, initial encounter: Secondary | ICD-10-CM

## 2023-02-20 DIAGNOSIS — E46 Unspecified protein-calorie malnutrition: Secondary | ICD-10-CM

## 2023-02-20 DIAGNOSIS — F1011 Alcohol abuse, in remission: Secondary | ICD-10-CM

## 2023-02-20 LAB — CBC WITH DIFF
BASOPHIL #: <0.1 10*3/uL (ref <=0.20)
BASOPHIL %: 0.2 %
EOSINOPHIL #: <0.1 10*3/uL (ref <=0.50)
EOSINOPHIL %: 0.7 %
HCT: 49.7 % (ref 38.9–52.0)
HGB: 16.1 g/dL (ref 13.4–17.5)
IMMATURE GRANULOCYTE #: <0.1 10*3/uL (ref <0.10)
IMMATURE GRANULOCYTE %: 0.6 % (ref 0.0–1.0)
LYMPHOCYTE #: 2.45 10*3/uL (ref 1.00–4.80)
LYMPHOCYTE %: 27.6 %
MCH: 31.5 pg (ref 26.0–32.0)
MCHC: 32.4 g/dL (ref 31.0–35.5)
MCV: 97.3 fL (ref 78.0–100.0)
MONOCYTE #: 1.12 10*3/uL — ABNORMAL HIGH (ref 0.20–1.10)
MONOCYTE %: 12.6 %
NEUTROPHIL #: 5.19 10*3/uL (ref 1.50–7.70)
NEUTROPHIL %: 58.3 %
PLATELETS: 203 10*3/uL (ref 150–400)
RBC: 5.11 10*6/uL (ref 4.50–6.10)
RDW-CV: 13.4 % (ref 11.5–15.5)
WBC: 8.9 10*3/uL (ref 3.7–11.0)

## 2023-02-20 LAB — BASIC METABOLIC PANEL
ANION GAP: 5 mmol/L (ref 4–13)
BUN/CREA RATIO: 33 — ABNORMAL HIGH (ref 6–22)
BUN: 28 mg/dL — ABNORMAL HIGH (ref 8–25)
CALCIUM: 9.7 mg/dL (ref 8.6–10.2)
CHLORIDE: 97 mmol/L (ref 96–111)
CO2 TOTAL: 35 mmol/L — ABNORMAL HIGH (ref 22–30)
CREATININE: 0.84 mg/dL (ref 0.75–1.35)
ESTIMATED GFR - MALE: >90 mL/min/BSA (ref >=60)
GLUCOSE: 128 mg/dL — ABNORMAL HIGH (ref 65–125)
POTASSIUM: 4.7 mmol/L (ref 3.5–5.1)
SODIUM: 137 mmol/L (ref 136–145)

## 2023-02-20 LAB — MAGNESIUM: MAGNESIUM: 1.8 mg/dL (ref 1.8–2.6)

## 2023-02-20 MED ORDER — CYCLOBENZAPRINE 5 MG TABLET
5.0000 mg | ORAL_TABLET | Freq: Three times a day (TID) | ORAL | 0 refills | Status: DC | PRN
Start: 2023-02-20 — End: 2023-03-07
  Filled 2023-02-20: qty 30, 10d supply, fill #0

## 2023-02-20 MED ORDER — GUAIFENESIN 100 MG/5 ML ORAL LIQUID
200.0000 mg | ORAL | 0 refills | Status: DC | PRN
Start: 2023-02-20 — End: 2023-03-07
  Filled 2023-02-20: qty 180, 3d supply, fill #0

## 2023-02-20 MED ORDER — POLYETHYLENE GLYCOL 3350 17 GRAM/DOSE ORAL POWDER
34.0000 g | Freq: Two times a day (BID) | ORAL | 0 refills | Status: DC
Start: 2023-02-20 — End: 2023-02-28
  Filled 2023-02-20: qty 1020, 30d supply, fill #0

## 2023-02-20 MED ORDER — BUDESONIDE-FORMOTEROL HFA 160 MCG-4.5 MCG/ACTUATION AEROSOL INHALER - RN
2.0000 | Freq: Two times a day (BID) | 0 refills | Status: DC
Start: 2023-02-20 — End: 2023-06-21
  Filled 2023-02-20: qty 10.2, 30d supply, fill #0

## 2023-02-20 MED ORDER — NICOTINE 21 MG/24 HR DAILY TRANSDERMAL PATCH
21.0000 mg | MEDICATED_PATCH | Freq: Every day | TRANSDERMAL | 0 refills | Status: DC
Start: 2023-02-21 — End: 2023-02-28
  Filled 2023-02-20: qty 14, 14d supply, fill #0

## 2023-02-20 MED ORDER — ALBUTEROL SULFATE HFA 90 MCG/ACTUATION AEROSOL INHALER
1.0000 | INHALATION_SPRAY | Freq: Four times a day (QID) | RESPIRATORY_TRACT | 3 refills | Status: DC | PRN
Start: 2023-02-20 — End: 2023-03-07
  Filled 2023-02-20: qty 18, 30d supply, fill #0

## 2023-02-20 MED ORDER — OXYCODONE 10 MG TABLET
10.0000 mg | ORAL_TABLET | ORAL | 0 refills | Status: DC | PRN
Start: 2023-02-20 — End: 2023-02-28
  Filled 2023-02-20: qty 24, 4d supply, fill #0

## 2023-02-20 MED ORDER — NALOXONE 4 MG/ACTUATION NASAL SPRAY
1.0000 | NASAL | 0 refills | Status: DC | PRN
Start: 2023-02-20 — End: 2023-03-07
  Filled 2023-02-20: qty 2, 1d supply, fill #0

## 2023-02-20 MED ORDER — ALLOPURINOL 100 MG TABLET
100.0000 mg | ORAL_TABLET | Freq: Two times a day (BID) | ORAL | 1 refills | Status: DC
Start: 2023-02-20 — End: 2023-03-05
  Filled 2023-02-20: qty 60, 30d supply, fill #0

## 2023-02-20 MED ORDER — SENNOSIDES 8.6 MG-DOCUSATE SODIUM 50 MG TABLET
2.0000 | ORAL_TABLET | Freq: Two times a day (BID) | ORAL | 0 refills | Status: DC
Start: 2023-02-20 — End: 2023-02-28
  Filled 2023-02-20: qty 120, 30d supply, fill #0

## 2023-02-20 MED ORDER — TIOTROPIUM BROMIDE 2.5 MCG/ACTUATION MIST FOR INHALATION - RN
2.0000 | Freq: Every day | RESPIRATORY_TRACT | 0 refills | Status: DC
Start: 2023-02-21 — End: 2023-06-21
  Filled 2023-02-20: qty 4, 30d supply, fill #0

## 2023-02-20 MED ORDER — ALBUTEROL SULFATE 2.5 MG/3 ML (0.083 %) SOLUTION FOR NEBULIZATION
2.5000 mg | INHALATION_SOLUTION | RESPIRATORY_TRACT | 0 refills | Status: DC | PRN
Start: 2023-02-20 — End: 2023-03-07
  Filled 2023-02-20: qty 180, 10d supply, fill #0

## 2023-02-20 MED ORDER — GABAPENTIN 300 MG CAPSULE
300.0000 mg | ORAL_CAPSULE | Freq: Two times a day (BID) | ORAL | 0 refills | Status: DC
Start: 2023-02-20 — End: 2023-04-18
  Filled 2023-02-20: qty 60, 30d supply, fill #0

## 2023-02-20 NOTE — Discharge Summary (Signed)
[facility]  DISCHARGE SUMMARY    PATIENT NAME:  [name] [name], [name] [name]  MRN:  [id number]  DOB:  [birth year]    ENCOUNTER DATE:  02/15/2023  INPATIENT ADMISSION DATE: 02/15/2023  DISCHARGE DATE:  02/20/2023    ATTENDING PHYSICIAN: No att. providers found  SERVICE: HOSPITALIST 3  PRIMARY CARE PHYSICIAN: Cecilie Kicks, DO   Has PCP been verified with patient and updated? Yes    Lay Caregiver Name: Nafis Farnan Caregiver Contact Number: [phone]   Lay Caregiver Relationship to patient: child    PRIMARY DISCHARGE DIAGNOSIS: Cervical stenosis of spine  Active Hospital Problems    Diagnosis Date Noted    Principal Problem: Cervical stenosis of spine [M48.02] 02/15/2023      Resolved Hospital Problems   No resolved problems to display.     There are no active non-hospital problems to display for this patient.          Current Discharge Medication List        START taking these medications.        Details   budesonide-formoterol 160-4.5 mcg/actuation Inhaler  Commonly known as: SYMBICORT   Take 2 Puffs by inhalation Twice daily  Qty: 10.2 g  Refills: 0     cyclobenzaprine 5 mg Tablet  Commonly known as: FLEXERIL   Take 1 Tablet (5 mg total) by mouth Every 8 hours as needed for Muscle spasms for up to 30 days  Qty: 30 Tablet  Refills: 0     gabapentin 300 mg Capsule  Commonly known as: NEURONTIN   Take 1 Capsule (300 mg total) by mouth Twice daily for 30 days  Qty: 60 Capsule  Refills: 0     guaiFENesin 100 mg/5 mL Liquid   Take 10 mL (200 mg total) by mouth Every 4 hours as needed for up to 30 days  Qty: 180 mL  Refills: 0     naloxone 4 mg/actuation Spray, Non-Aerosol  Commonly known as: NARCAN   Instill 1 spray by alternating nostril every 2 minutes as needed for actual or suspected opioid overdose. Call 911 if given.  Qty: 2 Each  Refills: 0     nicotine 21 mg/24 hr Patch 24 hr  Commonly known as: NICODERM CQ  Start taking on: February 21, 2023   Place 1 Patch (21 mg total) on the skin Once a day  Qty: 28  Each  Refills: 0     Oxycodone 10 mg Tablet  Commonly known as: ROXICODONE   Take 1 Tablet (10 mg total) by mouth Every 4 hours as needed for up to 7 days. Indications: pain  Qty: 24 Tablet  Refills: 0     polyethylene glycol 17 gram/dose Powder  Commonly known as: MIRALAX   Mix 6 teaspoons (34 g total) in liquid of choice and drink by mouth Twice daily  Qty: 2040 g  Refills: 0     sennosides-docusate sodium 8.6-50 mg Tablet  Commonly known as: SENOKOT-S   Take 2 Tablets by mouth Twice daily for 30 days  Qty: 120 Tablet  Refills: 0     tiotropium bromide 2.5 mcg/actuation Mist oral inhaler  Commonly known as: SPIRIVA RESPIMAT  Start taking on: February 21, 2023   Take 2 Inhalations (2 Puffs total) by inhalation Once a day for 30 days  Qty: 4 g  Refills: 0            CONTINUE these medications which have CHANGED during your visit.  Details   * albuterol sulfate 90 mcg/actuation oral inhaler  Commonly known as: PROVENTIL or VENTOLIN or PROAIR  What changed: Another medication with the same name was added. Make sure you understand how and when to take each.   Take 1-2 Puffs by inhalation Every 6 hours as needed for up to 30 days  Qty: 18 g  Refills: 3     * albuterol sulfate 2.5 mg /3 mL (0.083 %) nebulizer solution  Commonly known as: PROVENTIL  What changed: You were already taking a medication with the same name, and this prescription was added. Make sure you understand how and when to take each.   Take 3 mL (2.5 mg total) by nebulization Every 4 hours as needed for Wheezing for up to 30 days  Qty: 540 mL  Refills: 0           * This list has 2 medication(s) that are the same as other medications prescribed for you. Read the directions carefully, and ask your doctor or other care provider to review them with you.                CONTINUE these medications - NO CHANGES were made during your visit.        Details   allopurinoL 100 mg Tablet  Commonly known as: ZYLOPRIM   Take 1 Tablet (100 mg total) by mouth Twice  daily for 60 days  Qty: 60 Tablet  Refills: 1            Discharge med list refreshed?  YES     Allergies   Allergen Reactions    Naproxen Nausea/ Vomiting     HOSPITAL PROCEDURE(S):   No orders of the defined types were placed in this encounter.    Surgical/Procedural Cases on this Admission       Case IDs Date Procedure Surgeon Location Status    [id number] 02/18/23 DISCECTOMY SPINE CERVICAL ANTERIOR MICRO WITH FUSION AND INSTRUMENTATION 1 LEVEL Mariann Laster, MD [facility]          REASON FOR HOSPITALIZATION AND HOSPITAL COURSE   BRIEF HPI:  15 y M PMH gout, Advanced COPD presented mainly with bue and ble weakness.     BRIEF HOSPITAL NARRATIVE:   MRI consistant with severe canal narrowing. 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 NSGY and Neuro on board. S/p ant cervical diskectomy 4/15. Also with hypoxic respiratory failrue i/s/o advanced COPD. Failed O2 challenge. Patinet medically ready for d/c today with home oxygen and follow up outpatient with neurosurgery and pulmonary. Rest of care as below.     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  -progressive BUE/BLE weakness  -MRI C spine with C3-4 severe canal narrowing   -NSGY consulted, s/p anterior cervical diskectomy 4/15   -Neuro consulted, signed off as no further recs   -continue PRN narcotics for pain control   -PT OT - OK for home with assist      ACUTE HYPOXIC RESPIRATORY FAILRUE 2/2 COPD (LIKELY)  -hypoxic respiratory failure is likely of chronic element i/s/o advanced COPD  -failed oxygen challenge- will need oxygen for home: patient is mobile at home and will require portable oxygen  -CT Chest without acute pathology   -completed 5 days of steroid  -pulm evaluated, placed on Symbicort and Spiriva   -will discharge with symbicrot and Spiriva, albuterol prn (neb and inhaler, patinet has nebulizer at home)  -pulm follow up  outpatient     GOUT   -allopurinol     Chronic pain   - 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 Patient ideally would like to get off Suboxone entirely  -substance team aware of discharge.     Protein Calorie Malnutrition  -Patient meets criteria based on the following clinical characteristics:  -Nutrition consulted  -MNT protocol ordered  -monitor bowel functions        Lab Results   Component Value Date     ALBUMIN 3.8 02/15/2023       TRANSITION/POST DISCHARGE CARE/PENDING TESTS/REFERRALS:   -fu w pulmonary  -fu w NSGY  -fu primary care  -uses home oxygen  -use inhalers at home      CONDITION ON DISCHARGE:  A. Ambulation: Ambulation with assistive device  B. Self-care Ability: Complete  C. Cognitive Status Alert and Oriented x 3  D. Code status at discharge:       LINES/DRAINS/WOUNDS AT DISCHARGE:   Patient Lines/Drains/Airways Status       Active Line / Dialysis Catheter / Dialysis Graft / Drain / Airway / Wound       Name Placement date Placement time Site Days    Peripheral IV Right;Posterior Dorsal Metacarpals  (top of hand) 02/18/23  0747  -- 2    Wound  Incision Right Neck 02/18/23  0827  -- 2                    DISCHARGE DISPOSITION:  Home discharge  DISCHARGE INSTRUCTIONS:  Post-Discharge Follow Up Appointments       Follow up with Neurosurgery, Physician Office Center    Phone: [phone]    [facility] [address] [place] [address]      Thursday Feb 28, 2023    Return Patient Visit with Steva Ready, PA-C at  2:00 PM    Go to Cecilie Kicks, DO    Phone: [phone]    Where: [facility]      Thursday Mar 07, 2023    New Patient Visit with Thelma Barge, MD at 10:15 AM      Wednesday Mar 27, 2023    x-ray exam with POC DIAG1 at  9:15 AM    Return Patient Visit with Tonie Griffith, PA-C at  9:30 AM      Friday Mar 29, 2023    New Patient Visit with Diona Fanti, MD at  9:20 AM      Cardiology [facility]  [address]  [address], [place]  [address]  [place] [address]  [phone] Family Medicine, [facility] [place], [place]   [address]  [place] [address]  [phone] Imaging Services, Physician [facility]  Physician Office [place], [facility]  [address] [facility]  [place] [address]  [phone]    Neurology, [facility]  [facility], [place]  [address] [place] [address]  [phone] Neurosurgery, Physician [facility]  Physician Office [place], [facility]  [address] [facility]  [place] [address]  [phone]             XR CERVICAL SPINE AP AND LATERAL    Upright    Please schedule in 2 weeks in conjunction with the neurosurgery follow up appt.  Reason for Exam: cervical stenosis      Referral to PULMONOLOGY - [facility]   Referral Type: Physician Referral-Office Visits   Number of Visits Requested: 1     FOLLOW-UP: NEUROSURGERY - PHYSICIAN OFFICE CTR - [place], [place]     Follow-up in: 1 MONTH    Reason for visit: POST-OP VISIT    Coordination with other outpt. appts.? YES (provide details in comment section & place separate orders for those items) XR   Provider: Michail Jewels      DME - HOME OXYGEN    Study used to evaluate oxygen saturation at rest vs exercise was performed during an inpatient hospital stay within 2 days prior to discharge date and was the last test performed prior to discharge and met the following criteria:   Study performed at rest without oxygen.  Study performed during exercise without oxygen.  Study performed during exercise with oxygen applied that demonstrates improvement.    Results of oxygen trials:  O2 sat on Room Air at Rest: 87 (02/19/23 1141)  O2 sat on Oxygen at rest: 90 (02/19/23 1452)     Oxygen Amount (LPM): 4 (02/19/23 1452)  O2 sat on NC while Ambulating: 86 (02/19/23 1452)               Ht 188 cm    Wt 123 kg    Current  Attending: Tava Peery    I certify this pt is under my care as an attending physician & that I, or a collaborating ANP/PA had a face to face encounter with the pt or the durable medical power of attorney, as appropriate and explained the need for DME on the following date: 02/20/2023    The primary diagnosis as discussed in the face to face encounter justifying the need for home health services/DME is as follows: COPD    New Patient or Renewal? New Patient    Liter flow per minute 4    RA O2 Sats at rest: 87    RA O2 Sats at Rest Date: 02/20/2023    RA O2 Sats at Rest Time: 11:41 AM    I certify that home oxygen is necessary due to the following condition At rest, O2 sat is >89% on room air but during exercise O2 sat is <88% and O2 administration improves the hypoxemia    Physician has determined that patient has a severe lung disease or hypoxia related symptoms that will improve with oxygen Yes    Start Date 02/20/2023    Type of Concentrator STATIONARY CONCENTRATOR    Type of Concentrator OXYGEN CONSERVING DEVICE    Type of Concentrator PORTABLE OXYGEN TANKS    Type of Concentrator OXYGEN CONTENTS    Freedom of Choice: I have informed patient of their freedom of choice with respect to DME providers    RA O2 sats with ambulation/exercise (if sats 89% or greater @ rest) 86    Oxygen use will be continuous? Yes    Patient is mobile within the home and will require portable oxygen: Yes    Estimated Length of Need (in months; 99 mo.= lifetime) 99    Delivery Device NASAL CANNULA           Theo Krumholz, MD    Copies sent to Care Team         Relationship Specialty Notifications Start End    Cecilie Kicks, DO PCP - General FAMILY MEDICINE Admissions 06/12/22     Phone: [phone] Fax: [phone]  [address] [place]  [address]            Referring providers can utilize [email or web address] to access their referred [facility] patient's information.

## 2023-02-20 NOTE — Progress Notes (Addendum)
[facility]  Medicine Progress Note    [name] [name]  Date of service: 02/20/2023   Date of Admission:  02/15/2023    Hospital Day:  LOS: 5 days   Subjective: Saw pt this morning, states he is doing good. Was asking if we have the good news of sending him home and we said we can send him out if we have the home oxygen delivered today.     Vital Signs:  Temp  Avg: 36.8 C (98.2 F)  Min: 36.7 C (98.1 F)  Max: 36.9 C (98.4 F)    Pulse  Avg: 85.1  Min: 67  Max: 97 BP  Min: 134/86  Max: 151/84   Resp  Avg: 18.9  Min: 16  Max: 20 SpO2  Avg: 89.5 %  Min: 88 %  Max: 91 %          Input/Output    Intake/Output Summary (Last 24 hours) at 02/20/2023 1107  Last data filed at 02/20/2023 0800  Gross per 24 hour   Intake 1440 ml   Output 775 ml   Net 665 ml    I/O last shift:  04/17 0700 - 04/17 1859  In: 240 [P.O.:240]  Out: -    acetaminophen (TYLENOL) tablet, 650 mg, Oral, Q6H  albuterol (PROVENTIL) 2.5 mg / 3 mL (0.083%) neb solution, 2.5 mg, Nebulization, Q4H PRN  allopurinol (ZYLOPRIM) tablet, 100 mg, Oral, 2x/day  budesonide-formoterol (SYMBICORT) 160 mcg-4.5 mcg per inhalation oral inhaler - "Nursing to administer", 2 Puff, Inhalation, 2x/day  cyclobenzaprine (FLEXERIL) tablet, 5 mg, Oral, Q8H PRN  D5W 250 mL flush bag, , Intravenous, Q15 Min PRN  D5W 250 mL flush bag, , Intravenous, Q15 Min PRN  D5W 250 mL flush bag, , Intravenous, Q15 Min PRN  diazePAM (VALIUM) 5 mg/mL injection, 5 mg, Intravenous, Q30 Min PRN  [Held by provider] enoxaparin PF (LOVENOX) 40 mg/0.4 mL SubQ injection, 40 mg, Subcutaneous, Daily  gabapentin (NEURONTIN) capsule, 300 mg, Oral, 2x/day  guaiFENesin  per 5mL oral liquid - for cough (expectorant), 200 mg, Oral, Q4H PRN  HYDROmorphone (DILAUDID) 0.5 mg/0.5 mL injection, 0.5 mg, Intravenous, Q3H PRN  ketorolac (TORADOL) 30 mg/mL injection, 30 mg, Intravenous, Q6HRS  nicotine (NICODERM CQ) transdermal patch (mg/24 hr), 21 mg, Transdermal, Daily  NS 250 mL flush bag, , Intravenous,  Q15 Min PRN  NS 250 mL flush bag, , Intravenous, Q15 Min PRN  NS 250 mL flush bag, , Intravenous, Q15 Min PRN  NS flush syringe, 2-6 mL, Intracatheter, Q8HRS  NS flush syringe, 2-6 mL, Intracatheter, Q1 MIN PRN  NS flush syringe, 2-6 mL, Intracatheter, Q8HRS  NS flush syringe, 2-6 mL, Intracatheter, Q1 MIN PRN  NS flush syringe, 2-6 mL, Intracatheter, Q8HRS  NS flush syringe, 2-6 mL, Intracatheter, Q1 MIN PRN  ondansetron (ZOFRAN) 2 mg/mL injection, 4 mg, Intravenous, Q6H PRN  oxyCODONE (ROXICODONE) immediate release tablet, 10 mg, Oral, Q4H PRN   Or  oxyCODONE (ROXICODONE) IR tablet 15 mg, 15 mg, Oral, Q4H PRN  polyethylene glycol (MIRALAX) oral packet, 34 g, Oral, 2x/day  sennosides-docusate sodium (SENOKOT-S) 8.6-50mg  per tablet, 2 Tablet, Oral, 2x/day  tiotropium bromide (SPIRIVA RESPIMAT) 2.5 mcg per inhalation oral inhaler - "Nursing to administer", 2 Puff, Inhalation, Daily        Physical Exam:  Physical Exam  Constitutional:       Appearance: Normal appearance. He is obese.   HENT:      Head: Normocephalic.      Nose: Nose  normal.      Mouth/Throat:      Mouth: Mucous membranes are moist.      Pharynx: Oropharynx is clear.   Eyes:      Conjunctiva/sclera: Conjunctivae normal.      Pupils: Pupils are equal, round, and reactive to light.   Cardiovascular:      Rate and Rhythm: Normal rate and regular rhythm.      Pulses: Normal pulses.      Heart sounds: Normal heart sounds.   Pulmonary:      Effort: Pulmonary effort is normal.      Breath sounds: Normal breath sounds.   Abdominal:      General: Abdomen is flat. Bowel sounds are normal.   Musculoskeletal:         General: Normal range of motion.      Cervical back: Normal range of motion.   Skin:     General: Skin is warm.      Capillary Refill: Capillary refill takes less than 2 seconds.   Neurological:      Mental Status: He is alert.           Labs:  CBC Results Differential Results   Recent Results (from the past 30 hour(s))   CBC WITH DIFF    Collection  Time: 02/20/23  4:21 AM   Result Value    WBC 8.9    HGB 16.1    HCT 49.7    PLATELETS 203    Recent Results (from the past 30 hour(s))   CBC WITH DIFF    Collection Time: 02/20/23  4:21 AM   Result Value    WBC 8.9    NEUTROPHIL % 58.3    MONOCYTE % 12.6    BASOPHIL % 0.2    BASOPHIL # <0.10        BMP Results Other Chemistries Results   Results for orders placed or performed during the hospital encounter of 02/15/23 (from the past 30 hour(s))   BASIC METABOLIC PANEL    Collection Time: 02/20/23  4:21 AM   Result Value    SODIUM 137    POTASSIUM 4.7    CHLORIDE 97    CO2 TOTAL 35 (H)    BUN 28 (H)    CREATININE 0.84    Recent Results (from the past 30 hour(s))   MAGNESIUM    Collection Time: 02/20/23  4:21 AM   Result Value    MAGNESIUM 1.8        Liver/Pancreas Enzyme Results Liver Function Results   No results found for this or any previous visit (from the past 30 hour(s)). No results found for this or any previous visit (from the past 30 hour(s)).     Cardiac Results Coags Results   No results found for this or any previous visit (from the past 30 hour(s)). No results found for this or any previous visit (from the past 30 hour(s)).     Radiology:  Reviewed    PT/OT: Yes    Consults:   Consult Orders Placed This Encounter   Procedures    ADULT ED MEDICINE ADMIT REQUEST    IP CONSULT TO PULMONARY    IP CONSULT TO PSYCHIATRY - SUBSTANCE ABUSE DISORDER - ORDERING PROVIDER MUST CALL/PAGE SERVICE         Hardware (lines, foley's, tubes): None    Assessment/ Plan:   Active Hospital Problems    Diagnosis    Primary Problem: Cervical stenosis of spine  25 y M PMH gout, Advanced COPD admittedly mainly w weakness. 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 NSGY and Neuro on board. S/p ant cervical diskectomy 4/15. Also with hypoxic respiratory failrue i/s/o advanced COPD. Failed O2 challenge. Patinet medically ready for d/c today with home oxygen and follow up outpatient with  neurosurgery and pulmonary.     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  -progressive BUE/BLE weakness  -MRI C spine with C3-4 severe canal narrowing   -NSGY consulted, s/p anterior cervical diskectomy 4/15   -Neuro consulted, signed off as no further recs   -continue PRN narcotics for pain control   -PT OT - OK for home with assist     ACUTE HYPOXIC RESPIRATORY FAILRUE 2/2 COPD (LIKELY)  -hypoxic respiratory failure is likely of chronic element i/s/o advanced COPD  -failed oxygen challenge- will need oxygen for home: patient is mobile at home and will require portable oxygen  -CT Chest without acute pathology   -completed 5 days of steroid  -pulm evaluated, placed on Symbicort and Spiriva   -will discharge with symbicrot and Spiriva, albuterol prn (neb and inhaler, patinet has nebulizer at home)  -pulm follow up outpatient    GOUT   -allopurinol    Chronic pain   - 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 Patient ideally would like to get off Suboxone entirely    Protein Calorie Malnutrition  -Patient meets criteria based on the following clinical characteristics:     -Nutrition consulted     -MNT protocol ordered  -monitor bowel functions  Lab Results   Component Value Date    ALBUMIN 3.8 02/15/2023       DVT/PE Prophylaxis: Enoxaparin    Disposition Planning: Home discharge        On 02/20/2023 I spent a total visit time of 52 minutes. Time included review of tests and ordering tests, obtaining/reviewing history, examining the patient, communicating with consultants, documenting clinical information and counseling the patient and/or family regarding the diagnosis and management plan and coordination of care involved services directly related to patient care.    FOLLOW UP NOTE LEVEL 3 (TOTAL TIME > 50 MINUTES) ([id number])\      Karlynn Furrow, MD  Assistant Professor   Department of Internal Medicine

## 2023-02-20 NOTE — Transitional Care (Signed)
[facility]   Transition of Care Coordination  PCP Verification    Name: [name] [name]  Date of Service: 02/20/2023      I contacted Cecilie Kicks, DO's office to verify establishment and determine barriers to follow-up. Cecilie Kicks, DO verified as patient's PCP.      02/20/23 0906   Assessment Type   Assessment type Appointments   Appointments   Appointments PCP appointment   PCP appointment scheduled within 7 days of discharge   PCP appointment date 02/28/23  (2 pm)       PCP's office information:  Cecilie Kicks, DO  Phone: [phone]  Fax: [phone]    Chart Review:  Patient Status: Chart Review  30 Day Readmission: No  MyChart Status: Active           Preferred Pharmacy       [facility] [place], [place] - [address]    [address] [place] [address]    Phone: [phone] Fax: [phone]    Hours: Not open 24 hours               Hospitalist Service Coordinator: Charolette Forward, LPN  Phone Ext: [phone]

## 2023-02-20 NOTE — Care Plan (Signed)
Problem: Gas Exchange Impaired  Goal: Optimal Gas Exchange  Outcome: Adequate for Discharge     Problem: Infection  Goal: Absence of Infection Signs and Symptoms  Outcome: Adequate for Discharge  Intervention: Prevent or Manage Infection  Recent Flowsheet Documentation  Taken 02/20/2023 0800 by Pat Kocher, RN  Fever Reduction/Comfort Measures:   lightweight bedding   lightweight clothing     Problem: Pain Acute  Goal: Optimal Pain Control and Function  Outcome: Adequate for Discharge     Problem: Acute Rehab Services Goal & Intervention Plan  Goal: Bathing Goal  Description: Stand Alone Therapy Goal  Outcome: Adequate for Discharge  Goal: Bed Mobility Goal  Description: Stand Alone Therapy Goal  Outcome: Adequate for Discharge  Goal: Caregiver Training Goal  Description: Stand Alone Therapy Goal  Outcome: Adequate for Discharge  Goal: Cognition Goal  Description: Stand Alone Therapy Goal  Outcome: Adequate for Discharge  Goal: Cognition Goals, SLP  Description: Stand Alone Therapy Goal  Outcome: Adequate for Discharge  Goal: Communication Goals, SLP  Description: Stand Alone Therapy Goal  Outcome: Adequate for Discharge  Goal: Dysphagia Goals, SLP  Description: Stand Alone Therapy Goal  Outcome: Adequate for Discharge  Goal: Eating Self-Feeding Goal  Description: Stand Alone Therapy Goal  Outcome: Adequate for Discharge  Goal: Gait Training Goal  Description: Stand Alone Therapy Goal  Outcome: Adequate for Discharge  Goal: Grooming Goal  Description: Stand Alone Therapy Goal  Outcome: Adequate for Discharge  Goal: Home Management Goal  Description: Stand Alone Therapy Goal  Outcome: Adequate for Discharge  Goal: Interprofessional Goal  Description: Stand Alone Therapy Goal  Outcome: Adequate for Discharge  Goal: LB Dressing Goal  Description: Stand Alone Therapy Goal  Outcome: Adequate for Discharge  Goal: Occupational Therapy Goals  Description: Stand Alone Therapy Goal  Outcome: Adequate for Discharge  Goal:  Physical Therapy Goal  Description: Stand Alone Therapy Goal  Outcome: Adequate for Discharge  Goal: Range of Motion Goal  Description: Stand Alone Therapy Goal  Outcome: Adequate for Discharge  Goal: Strength Goal  Description: Stand Alone Therapy Goal  Outcome: Adequate for Discharge  Goal: Toileting Goal  Description: Stand Alone Therapy Goal  Outcome: Adequate for Discharge  Goal: Goal Transfer Training  Description: Stand Alone Therapy Goal  Outcome: Adequate for Discharge  Goal: UB Dressing Goal  Description: Stand Alone Therapy Goal  Outcome: Adequate for Discharge     Problem: Adult Inpatient Plan of Care  Goal: Plan of Care Review  Outcome: Adequate for Discharge  Goal: Patient-Specific Goal (Individualized)  Outcome: Adequate for Discharge  Flowsheets (Taken 02/20/2023 0800)  Individualized Care Needs: up ad lib  Anxieties, Fears or Concerns: Wants to go home today  Patient-Specific Goals (Include Timeframe): To get oxygen delivered and go home today  Plan of Care Reviewed With: patient  Goal: Absence of Hospital-Acquired Illness or Injury  Outcome: Adequate for Discharge  Intervention: Identify and Manage Fall Risk  Recent Flowsheet Documentation  Taken 02/20/2023 1000 by Pat Kocher, RN  Safety Promotion/Fall Prevention:   nonskid shoes/slippers when out of bed   safety round/check completed  Taken 02/20/2023 0800 by Pat Kocher, RN  Safety Promotion/Fall Prevention:   nonskid shoes/slippers when out of bed   safety round/check completed  Intervention: Prevent Skin Injury  Recent Flowsheet Documentation  Taken 02/20/2023 0800 by Pat Kocher, RN  Skin Protection: adhesive use limited  Intervention: Prevent and Manage VTE (Venous Thromboembolism) Risk  Recent Flowsheet Documentation  Taken 02/20/2023 0800 by Pat Kocher,  RN  VTE Prevention/Management: ambulation promoted  Goal: Optimal Comfort and Wellbeing  Outcome: Adequate for Discharge  Intervention: Provide Person-Centered Care  Recent  Flowsheet Documentation  Taken 02/20/2023 0800 by Pat Kocher, RN  Trust Relationship/Rapport:   care explained   choices provided   empathic listening provided   questions answered   emotional support provided  Goal: Rounds/Family Conference  Outcome: Adequate for Discharge

## 2023-02-20 NOTE — Nurses Notes (Signed)
Patient discharged home with family.  AVS reviewed with patient/care giver.  A written copy of the AVS and discharge instructions was given to the patient/care giver.  Questions sufficiently answered as needed.  Patient/care giver encouraged to follow up with PCP as indicated.  In the event of an emergency, patient/care giver instructed to call 911 or go to the nearest emergency room.

## 2023-02-20 NOTE — Consults (Signed)
[facility]  Substance Use Disorder Consult- New Patient Encounter      [name] [name]  1966-08-10  [id number]    Date of service: 02/20/23    CHIEF COMPLAINT:   Chief Complaint   Patient presents with    Weakness     [facility] transfer. weakness and paraesthesias to both upper and lower extremities. Denies recent falls/injuries but stated he has been unsteady    Shortness of Breath     +sob. Spo2 86% in triage on RA. Does not wear O2 at baseline. Stated he required O2 while at [facility] ER.      REASON FOR CONSULT: illicit opioid use      ASSESSMENT:  [name] [name] is a 57 y.o. male with history of multiple substance use; chronic pain; COPD; and Gout who was admitted on 4/12 for progressive weakness and acute hypoxic respiratory failure. 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 Substance use team was consulted for illicit Suboxone use. On evaluation, patient reported starting to receive prescriptions for full opioid agonists 5-6 years ago for his back pain. Around the same time, patient started getting pain pills off the street, and eventually started getting illicit Suboxone to help with the opioid withdrawal he was experiencing. At this time, patient does not wish to be started on Suboxone due to fear of dependence. Risks associated with ongoing use of full opioid agonists and/or uncontrolled pain were discussed at length. Patient expressed understanding, and was agreeable to reaching out to a local provider should concerns arise.    Safety Screening:  A safety assessment was completed. Risk factors include: physical symptom burden. At this time, the patient does not pose an acute risk above baseline. Protective factors include: no thoughts of self-harm, supportive family, stable housing, and demonstration of help-seeking behavior.    DIAGNOSES: Opioid Use Disorder; Alcohol Use Disorder, in early remission; Stimulant Use Disorder, in  sustained remission; Tobacco Use Disorder; Sedative Hypnotic Use Disorder, in sustained remission  Medical comorbidities: cervical stenosis of the spine (s/p cervical diskectomy on 4/15); Chronic pain; COPD; Gout; Obesity      PLAN:  - Please place psychiatry consult order if not already done.  - Precautions: No indication for 1:1 supervision.  - Discussed treatment options and recommended starting Suboxone.  Patient adamantly declined this due to fear of perpetuating dependence and therefore not being able to wean off of it.  Risks associated with ongoing use of full opioid agonists and/or uncontrolled pain were discussed at length. Patient expressed understanding, and stated he does not wish to continue taking oxy beyond the scripts provided at discharge.   - Instructions were provided should patient experience uncontrolled pain and/or cravings once his prescription runs out. Patient plans on making his f/u appointment with PCP on 4/22, and has the contact info for a local Suboxone provider  - Agree with NRT  - Disposition: Discussed outpatient care options. Declines referrals.  - Please page Substance Use Disorder team at [phone] with any questions or concerns. If after 4:00 PM or weekends, please page [phone].       HISTORY OF PRESENT ILLNESS:  [name] [name] is a 57 y.o. male with history of multiple substance use; chronic pain; COPD; and Gout who was admitted on 4/12 for progressive weakness and acute hypoxic respiratory failure. 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 Substance use team was  consulted for illicit Suboxone use.    On evaluation, patient reported starting to receive prescriptions for full opioid agonists 5-6 years ago for his back pain. Around the same time, patient started getting pain pills off the street due to limited access to providers. He eventually started getting illicit Suboxone ~3 years ago to help with the opioid withdrawal he was  experiencing. When he was able to obtain films, 1 mg daily was holding him well. He's been taking 2 mg daily since he's only been finding the tablet formulation. The highest dose he's been on was 4 mg daily (for brief periods of time). Patient expresses frustration with his dependence on Suboxone, and wishes to discontinue it. He's hopeful that now that he hasn't taken it for 4 days, he'd be able to wean himself off with oxycodone.     Regarding other substance use, patient reports developing an alcohol problem ~1.5 year ago. He was consuming 12-24 pack of twisted tea at some point, but was able to wean himself off it gradually. Patient reports being off of alcohol for 6 months now. He also reports history of heavy crack and benzo use 20-30 years ago. He's also had a period of experimenting with marijuana, speed, and others when he was teenager. Current substance use includes smoking 1PPD, however, patient states he doesn't have a choice other than to quit with how bad his COPD is.     Regarding mood, patient reports periods of depressed mood and feelings of hopelessness/helplessness associated with his declining health. At one point this past year, patient states he was told he may have Multiple Sclerosis and his weakness is only going to get worse. His declining health was also impacting his work, which was a major stressor. Patient denies SI/thoughts of self harm. He denied ever experiencing cardinal features of mania/hypomania, denies AVH, or history of trauma.    Risks associated with ongoing use of full opioid agonists and/or uncontrolled pain were discussed at length. Patient expressed understanding, and was agreeable to reaching out to a local provider should concerns arise. He explained that he'd met a Suboxone provider at [facility] who he plans on contacting if he notices opioid withdrawal or cravings once he runs out of his pain pills.      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 Has h/o 3-4 felony convictions - first incarcerated at age 43, and another from [age]      FAMILY HISTORY:  Older brother struggled with substance use  Both parents received "shock treatment"      ROS: 12 point ROS completed. Pertinent positives noted above.      Home Medications:   Medications Prior to Admission       Prescriptions    albuterol sulfate (PROVENTIL OR VENTOLIN OR PROAIR) 90 mcg/actuation Inhalation oral inhaler    Take 1-2 Puffs by inhalation Every 6 hours as needed    allopurinoL (ZYLOPRIM) 100 mg Oral Tablet    Take 1 Tablet (100 mg total) by mouth Twice daily       Inpatient Medications:  Current Facility-Administered Medications   Medication Dose Route Frequency    acetaminophen (TYLENOL) tablet  650 mg Oral Q6H    albuterol (PROVENTIL) 2.5 mg / 3 mL (0.083%) neb solution  2.5 mg Nebulization Q4H PRN    allopurinol (ZYLOPRIM) tablet  100 mg Oral 2x/day    budesonide-formoterol (SYMBICORT) 160 mcg-4.5 mcg per  inhalation oral inhaler - "Nursing to administer"  2 Puff Inhalation 2x/day    cyclobenzaprine (FLEXERIL) tablet  5 mg Oral Q8H PRN    D5W 250 mL flush bag   Intravenous Q15 Min PRN    D5W 250 mL flush bag   Intravenous Q15 Min PRN    D5W 250 mL flush bag   Intravenous Q15 Min PRN    diazePAM (VALIUM) 5 mg/mL injection  5 mg Intravenous Q30 Min PRN    [Held by provider] enoxaparin PF (LOVENOX) 40 mg/0.4 mL SubQ injection  40 mg Subcutaneous Daily    gabapentin (NEURONTIN) capsule  300 mg Oral 2x/day    gelatin absorbable (SURGIFOAM) 100cm topical sponge  1 Each Apply Topically INTRA-OP Once PRN    gelatin matrix sealant (FLOSEAL) 10 mL topical kit   Apply Topically INTRA-OP Once PRN    guaiFENesin 100mg  per 5mL oral liquid - for cough (expectorant)  200 mg Oral Q4H PRN    HYDROmorphone (DILAUDID) 0.5 mg/0.5 mL injection  0.5 mg Intravenous Q3H PRN    ketorolac (TORADOL) 30 mg/mL injection  30 mg Intravenous Q6HRS    lidocaine 1%-EPINEPHrine 1:100,000 injection  15 mL Injection INTRA-OP Once PRN    nicotine (NICODERM CQ) transdermal patch (mg/24 hr)  21 mg Transdermal Daily    NS 15 mL with thrombin (recombinant) (RECOTHROM) 5,000 Units topical solution   Topical INTRA-OP Once PRN    NS 250 mL flush bag   Intravenous Q15 Min PRN    NS 250 mL flush bag   Intravenous Q15 Min PRN    NS 250 mL flush bag   Intravenous Q15 Min PRN    NS flush syringe  2-6 mL Intracatheter Q8HRS    NS flush syringe  2-6 mL Intracatheter Q1 MIN PRN    NS flush syringe  2-6 mL Intracatheter Q8HRS    NS flush syringe  2-6 mL Intracatheter Q1 MIN PRN    NS flush syringe  2-6 mL Intracatheter Q8HRS    NS flush syringe  2-6 mL Intracatheter Q1 MIN PRN    ondansetron (ZOFRAN) 2 mg/mL injection  4 mg Intravenous Q6H PRN    oxyCODONE (ROXICODONE) immediate release tablet  10 mg Oral Q4H PRN    Or    oxyCODONE (ROXICODONE) IR tablet 15 mg  15 mg Oral Q4H PRN    polyethylene  glycol (MIRALAX) oral packet  34 g Oral 2x/day    sennosides-docusate sodium  (SENOKOT-S) 8.6-50mg  per tablet  2 Tablet Oral 2x/day    sterile water irrigation  1,000 mL Irrigation INTRA-OP Once PRN    tiotropium bromide (SPIRIVA RESPIMAT) 2.5 mcg per inhalation oral inhaler - "Nursing to administer"  2 Puff Inhalation Daily    vancomycin (VANCOCIN) powder for OR use  1 g Topical INTRA-OP Once PRN       Allergies:   Allergies   Allergen Reactions    Naproxen Nausea/ Vomiting       PHYSICAL EXAM:  Constitutional: BP (!) 139/91   Pulse 90   Temp 36.7 C (98.1 F)   Resp 20   Ht 1.88 m (6' 2.02")   Wt 123 kg (271 lb 2.7 oz)   SpO2 (!) 89%   BMI 34.80 kg/m   Eyes: Pupils equal, round. EOM grossly intact. No nystagmus.  Respiratory: Nasal canula in place, no labored breathing  Musculoskeletal: Moving all 4 extremities.   Neuro: Alert, oriented to person, place, time, situation. No abnormal movements noted. No tremor.  Skin: Dry. No diaphoresis or flushing.       Mental Status Exam:  Appearance: appears stated age, hospital gown, and obese. Nasal cannula in place - in no apparent distress  Behavior: calm and cooperative  Gait/Station: sitting upright in bedside chair  Musculoskeletal: No psychomotor agitation or retardation noted  Speech:  WNL  Mood: "Good"  Affect: euthymic, full range, and congruent with mood  Thought Process: linear and goal-directed  Associations:  no loosening of associations  Thought Content: appropriate -  no thoughts of self-harm  Perceptual Disturbances: no AVH  Attention/Concentration: attends well to interview  Orientation: oriented to person, place, time, situation  Memory: recent and remote memory intact per interview  Language: no word-finding issues  Insight: fair  Judgment: fair      PERTINENT STUDIES/TESTING:  UDS : positive for buprenorphine, fentanyl, benzo, and oxy (obtained 4/16)  EtOH: not obtained  EKG: QTc 451      Graceann Congress, MD  02/20/2023, 09:34      Discharged before formal staffing could occur.    Discussed with fellow. I reviewed the fellows  note.  I agree with the findings and plan of care as documented in the fellows note.  Any exceptions/additions are edited/noted.    Lauretta Chester, MD

## 2023-02-20 NOTE — Care Management Notes (Addendum)
[facility]  Care Management Note    Patient Name: [name] [name]  Date of Birth: 06-10-1966  Sex: male  Date/Time of Admission: 02/15/2023  7:29 AM  Room/Bed: 03/A  Payor: [employer] MEDICAID / Plan: HE[employer]AN MEDICAID / Product Type: Medicaid [facility] /    LOS: 5 days   Primary Care Providers:  Cecilie Kicks, DO, DO (General)    Admitting Diagnosis:  Cervical stenosis of spine [M48.02]    Assessment:      02/20/23 1013   Assessment Details   Assessment Type Continued Assessment   Date of Care Management Update 02/20/23   Date of Next DCP Update 02/22/23   Insurance Information/Type   Insurance type [facility]   Care Management Plan   Discharge Planning Status discharge plan complete   Projected Discharge Date 02/20/23   Discharge plan discussed with: Patient   CM will evaluate for rehabilitation potential yes   Discharge Needs Assessment   Equipment Needed After Discharge oxygen         Discharge Plan:  Home with DME (code 1)  CCC notified by service that patient is d/c ready today. Will need home O2 set up.  FOC signed for [employer] yesterday with CM  Referral sent this morning.  Order pended for MD signature  Task sent to CMA to arrange O2 delivery to hospital and home  Spoke with patient at bedside, advised of d/c plan. Discussed that O2 may not be delivered until later today. Patient in agreement.   Patient again declined [facility] services  Bedside nurse updated.     The patient will continue to be evaluated for developing discharge needs.     Case Manager: Stanford Scotland, RN  Phone: [phone]

## 2023-02-20 NOTE — Care Plan (Signed)
Patient remains alert Ox4, up ad lib.  VSS 5LNC O2 sats 88-92%, denies SOB, denies n/v/d, prn oxycodone given for pain. Safety check per protocol.  Call bell at bedside.  Problem: Gas Exchange Impaired  Goal: Optimal Gas Exchange  Outcome: Ongoing (see interventions/notes)  Intervention: Optimize Oxygenation and Ventilation  Recent Flowsheet Documentation  Taken 02/20/2023 0414 by Zara Council, RN  Head of Bed [facility]) Positioning: HOB elevated  Taken 02/19/2023 2030 by Zara Council, RN  Head of Bed [facility]) Positioning: HOB elevated     Problem: Infection  Goal: Absence of Infection Signs and Symptoms  Outcome: Ongoing (see interventions/notes)  Intervention: Prevent or Manage Infection  Recent Flowsheet Documentation  Taken 02/19/2023 2030 by Zara Council, RN  Fever Reduction/Comfort Measures:   lightweight clothing   lightweight bedding     Problem: Pain Acute  Goal: Optimal Pain Control and Function  Outcome: Ongoing (see interventions/notes)  Intervention: Prevent or Manage Pain  Recent Flowsheet Documentation  Taken 02/19/2023 2030 by Zara Council, RN  Sleep/Rest Enhancement: awakenings minimized  Intervention: Optimize Psychosocial Wellbeing  Recent Flowsheet Documentation  Taken 02/19/2023 2030 by Zara Council, RN  Diversional Activities: television  Supportive Measures: active listening utilized     Problem: Acute Rehab Services Goal & Intervention Plan  Goal: Bathing Goal  Description: Stand Alone Therapy Goal  Outcome: Ongoing (see interventions/notes)  Goal: Bed Mobility Goal  Description: Stand Alone Therapy Goal  Outcome: Ongoing (see interventions/notes)  Goal: Caregiver Training Goal  Description: Stand Alone Therapy Goal  Outcome: Ongoing (see interventions/notes)  Goal: Cognition Goal  Description: Stand Alone Therapy Goal  Outcome: Ongoing (see interventions/notes)  Goal: Cognition Goals, SLP  Description: Stand Alone Therapy Goal  Outcome: Ongoing (see interventions/notes)  Goal: Communication  Goals, SLP  Description: Stand Alone Therapy Goal  Outcome: Ongoing (see interventions/notes)  Goal: Dysphagia Goals, SLP  Description: Stand Alone Therapy Goal  Outcome: Ongoing (see interventions/notes)  Goal: Eating Self-Feeding Goal  Description: Stand Alone Therapy Goal  Outcome: Ongoing (see interventions/notes)  Goal: Gait Training Goal  Description: Stand Alone Therapy Goal  Outcome: Ongoing (see interventions/notes)  Goal: Grooming Goal  Description: Stand Alone Therapy Goal  Outcome: Ongoing (see interventions/notes)  Goal: Home Management Goal  Description: Stand Alone Therapy Goal  Outcome: Ongoing (see interventions/notes)  Goal: Interprofessional Goal  Description: Stand Alone Therapy Goal  Outcome: Ongoing (see interventions/notes)  Goal: LB Dressing Goal  Description: Stand Alone Therapy Goal  Outcome: Ongoing (see interventions/notes)  Goal: Occupational Therapy Goals  Description: Stand Alone Therapy Goal  Outcome: Ongoing (see interventions/notes)  Goal: Physical Therapy Goal  Description: Stand Alone Therapy Goal  Outcome: Ongoing (see interventions/notes)  Goal: Range of Motion Goal  Description: Stand Alone Therapy Goal  Outcome: Ongoing (see interventions/notes)  Goal: Strength Goal  Description: Stand Alone Therapy Goal  Outcome: Ongoing (see interventions/notes)  Goal: Toileting Goal  Description: Stand Alone Therapy Goal  Outcome: Ongoing (see interventions/notes)  Goal: Goal Transfer Training  Description: Stand Alone Therapy Goal  Outcome: Ongoing (see interventions/notes)  Goal: UB Dressing Goal  Description: Stand Alone Therapy Goal  Outcome: Ongoing (see interventions/notes)     Problem: Adult Inpatient Plan of Care  Goal: Plan of Care Review  Outcome: Ongoing (see interventions/notes)  Goal: Patient-Specific Goal (Individualized)  Outcome: Ongoing (see interventions/notes)  Flowsheets (Taken 02/19/2023 2030)  Individualized Care Needs: up ad lib  Anxieties, Fears or Concerns: wondering  when he is going home  Patient-Specific Goals (Include Timeframe): to go home  Plan of  Care Reviewed With: patient  Goal: Absence of Hospital-Acquired Illness or Injury  Outcome: Ongoing (see interventions/notes)  Intervention: Identify and Manage Fall Risk  Recent Flowsheet Documentation  Taken 02/20/2023 0414 by Zara Council, RN  Safety Promotion/Fall Prevention:   nonskid shoes/slippers when out of bed   safety round/check completed  Taken 02/20/2023 0122 by Zara Council, RN  Safety Promotion/Fall Prevention: safety round/check completed  Taken 02/19/2023 2030 by Zara Council, RN  Safety Promotion/Fall Prevention:   nonskid shoes/slippers when out of bed   safety round/check completed  Intervention: Prevent Skin Injury  Recent Flowsheet Documentation  Taken 02/20/2023 0414 by Zara Council, RN  Body Position: supine, head elevated  Taken 02/19/2023 2030 by Zara Council, RN  Body Position: sitting  Skin Protection: adhesive use limited  Intervention: Prevent and Manage VTE (Venous Thromboembolism) Risk  Recent Flowsheet Documentation  Taken 02/19/2023 2030 by Zara Council, RN  VTE Prevention/Management:   ambulation promoted   dorsiflexion/plantar flexion performed  Intervention: Prevent Infection  Recent Flowsheet Documentation  Taken 02/19/2023 2030 by Zara Council, RN  Infection Prevention:   rest/sleep promoted   promote handwashing  Goal: Optimal Comfort and Wellbeing  Outcome: Ongoing (see interventions/notes)  Intervention: Provide Person-Centered Care  Recent Flowsheet Documentation  Taken 02/19/2023 2030 by Zara Council, RN  Trust Relationship/Rapport:   care explained   choices provided   emotional support provided   empathic listening provided   questions answered   questions encouraged   reassurance provided   thoughts/feelings acknowledged  Goal: Rounds/Family Conference  Outcome: Ongoing (see interventions/notes)

## 2023-02-21 ENCOUNTER — Telehealth (HOSPITAL_COMMUNITY): Payer: Self-pay

## 2023-02-21 LAB — SUBOXONE CONFIRMATORY/DEFINITIVE, URINE, BY LC-MS/MS (PERFORMABLE)
BUPRENORPHINE: 64 ng/mL — ABNORMAL HIGH (ref <10)
NALOXONE: 19 ng/mL — ABNORMAL HIGH (ref <5)
NORBUPRENORPHINE: 339 ng/mL — ABNORMAL HIGH (ref <10)

## 2023-02-21 LAB — DRUG MONITOR, BENZO, QN, URINE
ALPHAHYDROXYALPRAZOLAM: NEGATIVE ng/mL (ref <25)
ALPHAHYDROXYMIDAZOLAM: 350 ng/mL — ABNORMAL HIGH (ref <50)
ALPHAHYDROXYTRIAZOLAM: NEGATIVE ng/mL (ref <50)
AMINOCLONAZEPAM: NEGATIVE ng/mL (ref <25)
HYDROXYETHYLFLURAZEPAM: NEGATIVE ng/mL (ref <50)
LORAZEPAM: NEGATIVE ng/mL (ref <50)
NORDIAZEPAM: NEGATIVE ng/mL (ref <50)
OXAZEPAM: NEGATIVE ng/mL (ref <50)
TEMAZEPAM: 99 ng/mL — ABNORMAL HIGH (ref <50)

## 2023-02-21 LAB — OPIATES CONFIRMATORY/DEFINITIVE, URINE, BY LC-MS/MS (PERFORMABLE)
CODEINE: NOT DETECTED ng/mL (ref <25)
DIHYDROCODEINE: NOT DETECTED ng/mL (ref <25)
HYDROCODONE: NOT DETECTED ng/mL (ref <25)
HYDROMORPHONE: 310 ng/mL — ABNORMAL HIGH (ref <25)
MORPHINE: NOT DETECTED ng/mL (ref <25)
NORHYDROCODONE: NOT DETECTED ng/mL (ref <25)
NOROXYCODONE: >1000 ng/mL — ABNORMAL HIGH (ref <25)
OXYCODONE: >1000 ng/mL — ABNORMAL HIGH (ref <25)
OXYMORPHONE: >1000 ng/mL — ABNORMAL HIGH (ref <25)

## 2023-02-21 LAB — NOTES AND COMMENTS

## 2023-02-21 LAB — FENTANYL CONFIRMATORY/DEFINITIVE, URINE, BY LC-MS/MS (PERFORMABLE)
FENTANYL: 4.8 ng/mL — ABNORMAL HIGH (ref <0.5)
NORFENTANYL: 28 ng/mL — ABNORMAL HIGH (ref <2)

## 2023-02-21 NOTE — Telephone Encounter (Signed)
Transition of Care Contact Information  Discharge Date: 02/20/2023  Transition Facility Type--Hospital (Inpatient or Observation)  Facility Name  Interactive Contact(s): Completed or attempted contact indicated by Date/Time  First Attempt Call: 02/21/2023  1:17 PM  Second Attempted Contact: 02/21/2023  3:54 PM  Contact Method(s)-- Patient/Caregiver Telephone  Clinical Staff Name/Role who contacted  Transition Note:Attempted hospital discharge follow up call- no answer noted at this time, voicemail left with Coordinators' contact information requesting a call back.  Will attempt to contact patient at a later time.  Noreene Larsson Rojean Ige   Hospitalist [facility] LPN  Ext [phone]  Attempted hospital discharge follow up call- no answer noted at this time, voicemail left with Coordinators' contact information requesting a call back.Noreene Larsson Calan Doren   Hospitalist [facility] LPN  Ext [phone]      Unable to Complete TCM due to:  Further information may be documented in relevant telephone or outreach encounter.

## 2023-02-25 ENCOUNTER — Encounter (HOSPITAL_COMMUNITY): Payer: Self-pay | Admitting: Internal Medicine

## 2023-02-25 NOTE — Care Management Notes (Signed)
Referral Information  ++++++ Placed Provider #1 ++++++  Case Manager: Wesley Thorne  Provider Type: DME  Provider Name: [facility] [place]  Address:  [address]  [place], [place] [address]  Contact:    Fax:   Fax:

## 2023-02-26 ENCOUNTER — Encounter (HOSPITAL_COMMUNITY): Payer: Self-pay | Admitting: Internal Medicine

## 2023-02-28 ENCOUNTER — Ambulatory Visit: Payer: MEDICAID | Attending: Physician Assistant | Admitting: Physician Assistant

## 2023-02-28 ENCOUNTER — Encounter (INDEPENDENT_AMBULATORY_CARE_PROVIDER_SITE_OTHER): Payer: Self-pay | Admitting: Physician Assistant

## 2023-02-28 ENCOUNTER — Other Ambulatory Visit: Payer: Self-pay

## 2023-02-28 ENCOUNTER — Ambulatory Visit (HOSPITAL_BASED_OUTPATIENT_CLINIC_OR_DEPARTMENT_OTHER): Payer: MEDICAID

## 2023-02-28 VITALS — BP 132/68 | HR 89 | Temp 97.9°F | Ht 74.0 in | Wt 276.4 lb

## 2023-02-28 DIAGNOSIS — Z9889 Other specified postprocedural states: Secondary | ICD-10-CM | POA: Insufficient documentation

## 2023-02-28 DIAGNOSIS — Z87442 Personal history of urinary calculi: Secondary | ICD-10-CM | POA: Insufficient documentation

## 2023-02-28 DIAGNOSIS — M109 Gout, unspecified: Secondary | ICD-10-CM | POA: Insufficient documentation

## 2023-02-28 DIAGNOSIS — Z1211 Encounter for screening for malignant neoplasm of colon: Secondary | ICD-10-CM | POA: Insufficient documentation

## 2023-02-28 DIAGNOSIS — E876 Hypokalemia: Secondary | ICD-10-CM

## 2023-02-28 DIAGNOSIS — Z09 Encounter for follow-up examination after completed treatment for conditions other than malignant neoplasm: Secondary | ICD-10-CM | POA: Insufficient documentation

## 2023-02-28 DIAGNOSIS — Z981 Arthrodesis status: Secondary | ICD-10-CM

## 2023-02-28 DIAGNOSIS — M4802 Spinal stenosis, cervical region: Secondary | ICD-10-CM | POA: Insufficient documentation

## 2023-02-28 DIAGNOSIS — J449 Chronic obstructive pulmonary disease, unspecified: Secondary | ICD-10-CM | POA: Insufficient documentation

## 2023-02-28 DIAGNOSIS — F17200 Nicotine dependence, unspecified, uncomplicated: Secondary | ICD-10-CM | POA: Insufficient documentation

## 2023-02-28 DIAGNOSIS — F17210 Nicotine dependence, cigarettes, uncomplicated: Secondary | ICD-10-CM | POA: Insufficient documentation

## 2023-02-28 DIAGNOSIS — S14129A Central cord syndrome at unspecified level of cervical spinal cord, initial encounter: Secondary | ICD-10-CM | POA: Insufficient documentation

## 2023-02-28 LAB — MAGNESIUM: MAGNESIUM: 2 mg/dL (ref 1.8–2.6)

## 2023-02-28 LAB — POTASSIUM: POTASSIUM: 4.4 mmol/L (ref 3.5–5.1)

## 2023-02-28 MED ORDER — METHOCARBAMOL 750 MG TABLET
750.0000 mg | ORAL_TABLET | Freq: Three times a day (TID) | ORAL | 0 refills | Status: DC
Start: 2023-02-28 — End: 2023-03-07

## 2023-02-28 MED ORDER — NICOTINE 21 MG/24 HR DAILY TRANSDERMAL PATCH
21.0000 mg | MEDICATED_PATCH | Freq: Every day | TRANSDERMAL | 1 refills | Status: DC
Start: 2023-02-28 — End: 2023-03-07

## 2023-02-28 NOTE — Progress Notes (Signed)
FAMILY MEDICINE, [facility]  [address]  [place] [address]  Operated by [facility]  Transitional Care Management Visit    Name: [name] [name]  MRN: [id number]    Date: 02/28/2023  Age: 57 y.o.      Chief Complaint: Hospital Follow Up and Hospital Discharge Transition  S/p cervical C3-C4 ACDF / COPD with acute hypoxic respiratory failure  History of Present Illness:  [name] [name] is a 57 y.o. male is seen for transitional care management.    Past Medical History:  He has a past medical history of COPD (chronic obstructive pulmonary disease) (CMS HCC), Gout, Kidney stones, and Low back pain.    Past Surgical History:  He has a past surgical history that includes hx foot surgery (Left); Lithotripsy; hx hernia repair; and Disc removal (02/18/2023).    Problem List:  He has Cervical stenosis of spine on their problem list.    Medications:    albuterol sulfate    albuterol sulfate    allopurinoL    budesonide-formoterol    cyclobenzaprine    gabapentin    guaiFENesin    methocarbamoL    naloxone    nicotine    tiotropium bromide     Review of Systems:  S/p cervical surgery- pt is doing well at this time  Incision is healing, no signs of infection at this time  Pt has f/u neurosurgery on 03/27/23   Pt does state the flexeril causes him drowsiness and does not like the way it makes him feel after taking the medication. States he does have some muscle spasms in the left side of his neck  Pt states he is no longer taking oral pain medications    COPD: stable at this time  Monitoring pulse ox at home staying around 90%  States he is not currently using the at home oxygen     Pt request refill of nicotine patches  Is attempting to quit smoking      Exam:   BP 132/68 (Site: Right, Patient Position: Sitting, Cuff Size: Adult)   Pulse 89   Temp 36.6 C (97.9 F) (Oral)   Ht 1.88 m (6\' 2" )   Wt 125 kg (276 lb 6.4 oz)   SpO2 (!) 87%   BMI 35.49 kg/m         Transition of  Care Contact Information  Discharge date: Discharge Date: 02/20/2023  Transition Facility Type--Hospital (Inpatient or Observation)  Facility Name Interactive Contact(s):  First Attempt Call: 02/21/2023  1:17 PM  Second Attempted Contact: 02/21/2023  3:54 PM  Contact Method(s)-- Patient/Caregiver Telephone  Clinical Staff Name/Role who contacted     Data Reviewed  Medication Reconciliation completed     Assessment/Plan:    ICD-10-CM    1. Hospital discharge follow-up  Z09       2. Central cord syndrome (CMS HCC)  [id number]       3. Cervical spinal stenosis  M48.02       4. S/P cervical discectomy  Z98.890       5. Tobacco dependence  F17.200 nicotine (NICODERM CQ) 21 mg/24 hr Transdermal Patch 24 hr      6. Cigarette smoker  F17.210 nicotine (NICODERM CQ) 21 mg/24 hr Transdermal Patch 24 hr      7. Hypomagnesemia  E83.42 Magnesium     POTASSIUM      8. Hypokalemia  E87.6 Magnesium     POTASSIUM  9. Screening for colon cancer  Z12.11 Cologuard colon cancer screening      10. Chronic obstructive pulmonary disease, unspecified COPD type (CMS HCC)  J44.9          Other transition actions (Optional) -: Discharge documentation was reviewed    Keep f/u with neurosurgeon  D/x flexeril will have the patient try robaxin   Follow discharge instructions from neurosurgeon     Lab work will be repeated today  Pt reports history of hypokalemia and hypomagnesemia    Smoker:  Discussed smoking cessation  Nicotine patches sent to pharmacy for refill     Orders Placed This Encounter    Magnesium    POTASSIUM    nicotine (NICODERM CQ) 21 mg/24 hr Transdermal Patch 24 hr    methocarbamoL (ROBAXIN) 750 mg Oral Tablet    Cologuard colon cancer screening       Return if symptoms worsen or fail to improve, for Follow up after testing.     Steva Ready, PA-C

## 2023-03-01 NOTE — Result Encounter Note (Signed)
Please let the patient know that potassium and magnesium are WNL from yesterdays lab work.

## 2023-03-01 NOTE — Result Encounter Note (Signed)
Patient notified 03/01/23 sms

## 2023-03-04 ENCOUNTER — Other Ambulatory Visit (INDEPENDENT_AMBULATORY_CARE_PROVIDER_SITE_OTHER): Payer: Self-pay | Admitting: Family Medicine

## 2023-03-05 ENCOUNTER — Other Ambulatory Visit (INDEPENDENT_AMBULATORY_CARE_PROVIDER_SITE_OTHER): Payer: Self-pay | Admitting: Family Medicine

## 2023-03-06 MED ORDER — ALLOPURINOL 100 MG TABLET
100.0000 mg | ORAL_TABLET | Freq: Two times a day (BID) | ORAL | 1 refills | Status: AC
Start: 2023-03-06 — End: 2023-06-24

## 2023-03-06 NOTE — Telephone Encounter (Signed)
Patient notified 03/06/23 sms

## 2023-03-07 ENCOUNTER — Encounter (INDEPENDENT_AMBULATORY_CARE_PROVIDER_SITE_OTHER): Payer: Self-pay | Admitting: Neurology

## 2023-03-07 ENCOUNTER — Other Ambulatory Visit: Payer: Self-pay

## 2023-03-07 ENCOUNTER — Ambulatory Visit: Payer: MEDICAID | Attending: Neurology | Admitting: Neurology

## 2023-03-07 VITALS — Ht 74.0 in | Wt 270.0 lb

## 2023-03-07 DIAGNOSIS — Z7982 Long term (current) use of aspirin: Secondary | ICD-10-CM | POA: Insufficient documentation

## 2023-03-07 DIAGNOSIS — I679 Cerebrovascular disease, unspecified: Secondary | ICD-10-CM | POA: Insufficient documentation

## 2023-03-07 DIAGNOSIS — M542 Cervicalgia: Secondary | ICD-10-CM | POA: Insufficient documentation

## 2023-03-07 DIAGNOSIS — R269 Unspecified abnormalities of gait and mobility: Secondary | ICD-10-CM | POA: Insufficient documentation

## 2023-03-07 DIAGNOSIS — M4802 Spinal stenosis, cervical region: Secondary | ICD-10-CM | POA: Insufficient documentation

## 2023-03-07 DIAGNOSIS — M5416 Radiculopathy, lumbar region: Secondary | ICD-10-CM | POA: Insufficient documentation

## 2023-03-07 DIAGNOSIS — Z79899 Other long term (current) drug therapy: Secondary | ICD-10-CM | POA: Insufficient documentation

## 2023-03-07 DIAGNOSIS — R531 Weakness: Secondary | ICD-10-CM | POA: Insufficient documentation

## 2023-03-07 DIAGNOSIS — R29898 Other symptoms and signs involving the musculoskeletal system: Secondary | ICD-10-CM | POA: Insufficient documentation

## 2023-03-07 DIAGNOSIS — M545 Low back pain, unspecified: Secondary | ICD-10-CM | POA: Insufficient documentation

## 2023-03-07 NOTE — Progress Notes (Signed)
NEUROLOGY, [facility]  [address]  [place] [address]  Operated by [facility]     Name: [name] [name] MRN:  [id number]   Date: 03/07/2023 Age: 57 y.o.  DOB: 06-21-1966      Chief Compliant:   Chief Complaint              New Patient     Gait Abnormality, Any Type             History of Present Illness:  [name] [name] is a 56 y.o. White male presenting to the office for patient is new to this clinic.  He had been having trouble walking with leg weakness over the last year that came on gradually and stepwise.  He is found to have cervical stenosis at C3-4 and had surgery about 2 or 3 weeks ago as walking much better.  Some back pain and neck pain.  Lower back pain goes down both legs at times it is in his butt.  His legs are weak but better than they were  prior to the cervical spine surgery.  That was done down in [facility] by Dr. Michail Jewels.  MRI of the thoracic spine showed nothing significant.  MRI of the brain shows some white matter changes.  Probably not multiple sclerosis.  He was a long-time smoker and quit recently.  He has chronic obstructive pulmonary disease.  Was on oxygen home but now he is improving and he is not.  Presently..  X-rays of his lower back last year so degenerative changes.  He is on Suboxone which she has been off the street helps with the pain.  Is also on Motrin.  Echo of the heart did not show anything significant    Past Medical History:   Diagnosis Date    COPD (chronic obstructive pulmonary disease) (CMS HCC)     Gout     Kidney stones     Low back pain      Past Surgical History:   Procedure Laterality Date    DISC REMOVAL  02/18/2023    C3    HX CERVICAL SPINE SURGERY      HX FOOT SURGERY Left     jont implant left toe    HX HERNIA REPAIR      LITHOTRIPSY       Current Outpatient Medications   Medication Sig    allopurinoL (ZYLOPRIM) 100 mg Oral Tablet Take 1 Tablet (100 mg total) by mouth Twice daily for 60 days    budesonide-formoterol (SYMBICORT)  160-4.5 mcg/actuation Inhaler Take 2 Puffs by inhalation Twice daily    buprenorphine HCl/naloxone HCl (SUBOXONE SL) Place under the tongue ASKED PATIENT WHAT THE MG WAS ON THIS MEDICATION AND He STATES THAT He DOES NOT HAVE A PRESCRIPTION FOR IT He BUYS IT OFF THE "[employer]"    gabapentin (NEURONTIN) 300 mg Oral Capsule Take 1 Capsule (300 mg total) by mouth Twice daily for 30 days    glucosam-chon-msm1-C-mang (OSTEO BI-FLEX TRIPLE STRENGTH) 750 mg-644 mg- 30 mg-1 mg Oral Tablet Take 2 Tablets by mouth Once a day    Ibuprofen (MOTRIN) 200 mg Oral Tablet Take 1 Tablet (200 mg total) by mouth Four times a day as needed for Pain    Magnesium Oxide 500 mg magnesium Oral Tablet Take 1 Tablet (500 mg total) by mouth Once a day    multivit,cal,mn/folic/D3/lycop (ONE A DAY MEN COMPLETE ORAL) Take 1 Tablet by mouth Once a day  omega-3 fatty acid - fish oil (FISH OIL) 340-1,000 mg Oral Capsule Take 1 Capsule by mouth Twice daily    tiotropium bromide (SPIRIVA RESPIMAT) 2.5 mcg/actuation Inhalation Mist oral inhaler Take 2 Inhalations (2 Puffs total) by inhalation Once a day for 30 days    vitamin B complex Oral Tablet Take 1 Tablet by mouth Once a day     Allergies   Allergen Reactions    Naproxen Nausea/ Vomiting         Review of Systems:  All significant ROS addressed in the HPI.      Physical Exam:  Ht 1.88 m (6\' 2" )   Wt 122 kg (270 lb)   BMI 34.67 kg/m       General Appearance Well-nourished  Carotids No bruits  Heart No murmurs  Peripheral pulses Normal     Mental Status    LOC Alert  Attention Normal   Orientation Oriented to person, place, time  Memory Normal   Language Normal    Fund of Knowledge Normal      Cranial Nerves    II (VF)    Normal    II (VA)    Normal    II (Fundi)    Normal    III, IV, VI(EOMS)    Normal    V (sensation, mastication)    Normal   VII (facial strength)    Normal   VIII (hearing)    Normal   IX, X (swallowing, phonation)  Normal   XI (shoulder shrug)    Normal   XII (tongue  protrusion)    Normal   Gait and Station    Normal   Tandem Gait    Normal     Motor Right    Left   Deltoid 5 5   Triceps 5 5   Biceps 5 5   Grip 5 5   Hip Flexors 5 5   Hip Extensors 5 5   Knee Flexors 5 5   Knee Extensor 5 5   Foot Plantar Flexorsv 5 5   Foot Dorsi Flexors 5 5        Hip flexor weakness in the leg bilaterally 4- over 5              Muscle Tone Normal  Normal               Sensory     Pinprick Normal  Normal    Vibration Normal  Normal    JPS Normal  Normal        Reflexes  Biceps 2+ 2+   Triceps 2+ 2+   Knee 2+ 2+   Ankle 2+ 2+   Babinski Sign absent absent   Finger Jerks absent absent   Jaw Jerk absent absent        3+ throughout.    Coordination       Finger-to-Nose Normal  Normal    RAM Normal  Normal    Heel-along-shin Normal  Normal    Slow and somewhat ataxic gait but he has had it is doing much better since the neck surgery  Lab:  COMPREHENSIVE METABOLIC PANEL   Lab Results   Component Value Date    SODIUM 137 02/20/2023    POTASSIUM 4.4 02/28/2023    CHLORIDE 97 02/20/2023    CO2 35 (H) 02/20/2023    ANIONGAP 5 02/20/2023    BUN 28 (H) 02/20/2023    CREATININE 0.84 02/20/2023    GLUCOSENF 102  08/16/2021    CALCIUM 9.7 02/20/2023    PHOSPHORUS 3.9 02/17/2023    ALBUMIN 3.8 02/15/2023    TOTALPROTEIN 7.7 02/15/2023    ALKPHOS 88 02/15/2023    AST 23 02/15/2023    ALT 23 02/15/2023    BILIRUBINCON 0.2 02/15/2023            COMPLETE BLOOD COUNT   Lab Results   Component Value Date    WBC 8.9 02/20/2023    HGB 16.1 02/20/2023    HCT 49.7 02/20/2023    PLTCNT 203 02/20/2023    BANDS 1 02/16/2023       DIFFERENTIAL  Lab Results   Component Value Date    PMNS 58.3 02/20/2023    LYMPHOCYTES 4 02/16/2023    MONOCYTES 12.6 02/20/2023    EOSINOPHIL 0 02/16/2023    BASOPHILS 0.2 02/20/2023    BASOPHILS <0.10 02/20/2023    PMNABS 5.19 02/20/2023    LYMPHSABS 2.45 02/20/2023    EOSABS <0.10 02/20/2023    MONOSABS 1.12 (H) 02/20/2023        Lab Results   Component Value Date    CHOLESTEROL 210 (H)  12/05/2022    HDLCHOL 42 (L) 12/05/2022    LDLCHOL 131 (H) 12/05/2022    TRIG 206 (H) 12/05/2022       TSH (uIU/mL)   Date Value   02/15/2023 0.357      No results found for: "ESRTDS", "ESR"  No results found for: "HIV1GENO"  Lab Results   Component Value Date    VITB12 525 02/15/2023    FOLATE 17.0 02/15/2023      No results found for this or any previous visit (from the past [id number] hour(s)).     No results found for: "[facility]"          No results found for this visit on 03/07/23.     Assessment:     ICD-10-CM    1. Cervical stenosis of spine  M48.02 Referral to PAIN MANAGEMENT - [facility]      2. Gait disturbance  R26.9 MRI SPINE LUMBOSACRAL WO CONTRAST      3. Lumbar radiculopathy  M54.16 MRI SPINE LUMBOSACRAL WO CONTRAST     Referral to PAIN MANAGEMENT - [facility]      4. Leg weakness, bilateral  R29.898 MRI SPINE LUMBOSACRAL WO CONTRAST      5. Cerebral vascular disease  I67.9 CAROTID ARTERY DUPLEX      6. Neck pain  M54.2 Referral to PAIN MANAGEMENT - [facility] - [facility]      7. Lower back pain  M54.50 MRI SPINE LUMBOSACRAL WO CONTRAST     Referral to PAIN MANAGEMENT - [facility] - [facility]           PLAN:  Carotid Doppler.  Baby aspirin daily.  Back off the anti-inflammatories.  Advised him to stop the Suboxone.  Referral to pain Clinic for possible injections.  MRI of the LS spine.  I think his lesions in his brain are not probably demyelinating but small-vessel changes.  Echo was okay.  Re-evaluate    Follow Up: Return in about 4 weeks (around 04/04/2023).      Testing and/or Medications and or other therapies ordered:  Orders Placed This Encounter    MRI SPINE LUMBOSACRAL WO CONTRAST    Referral to PAIN MANAGEMENT - [facility]    CAROTID ARTERY DUPLEX

## 2023-03-08 ENCOUNTER — Ambulatory Visit
Admission: RE | Admit: 2023-03-08 | Discharge: 2023-03-08 | Disposition: A | Discharge status: Home / Self Care | Payer: MEDICAID | Source: Ambulatory Visit | Attending: Neurology | Admitting: Neurology

## 2023-03-08 DIAGNOSIS — I679 Cerebrovascular disease, unspecified: Secondary | ICD-10-CM | POA: Insufficient documentation

## 2023-03-11 ENCOUNTER — Ambulatory Visit (INDEPENDENT_AMBULATORY_CARE_PROVIDER_SITE_OTHER): Payer: Self-pay | Admitting: Physician Assistant

## 2023-03-11 ENCOUNTER — Telehealth (INDEPENDENT_AMBULATORY_CARE_PROVIDER_SITE_OTHER): Payer: Self-pay | Admitting: Neurology

## 2023-03-11 DIAGNOSIS — I679 Cerebrovascular disease, unspecified: Secondary | ICD-10-CM

## 2023-03-11 DIAGNOSIS — I6523 Occlusion and stenosis of bilateral carotid arteries: Secondary | ICD-10-CM

## 2023-03-11 NOTE — Telephone Encounter (Signed)
PATIENT WAS NOTIFIED OF SCHEDULED MRI BRAIN DATE AND TIME.

## 2023-03-11 NOTE — Telephone Encounter (Signed)
I spoke w/Clennon and advised of appt and imaging, date, times, locations, and advised appt details are available on MY CHART if applicable.  Eamon verbalized understanding.     Kind Regards and Appreciation,    Jessi, CMA-Neurosurgery [phone]

## 2023-03-12 ENCOUNTER — Ambulatory Visit (INDEPENDENT_AMBULATORY_CARE_PROVIDER_SITE_OTHER): Payer: Self-pay | Admitting: Neurology

## 2023-03-18 ENCOUNTER — Emergency Department (HOSPITAL_COMMUNITY)
Admission: RE | Admit: 2023-03-18 | Discharge: 2023-03-18 | Disposition: A | Discharge status: Home / Self Care | Payer: MEDICAID | Source: Ambulatory Visit

## 2023-03-18 ENCOUNTER — Encounter (INDEPENDENT_AMBULATORY_CARE_PROVIDER_SITE_OTHER): Payer: Self-pay | Admitting: NURSE PRACTITIONER, FAMILY

## 2023-03-18 ENCOUNTER — Other Ambulatory Visit: Payer: Self-pay

## 2023-03-18 ENCOUNTER — Emergency Department
Admission: EM | Admit: 2023-03-18 | Discharge: 2023-03-18 | Disposition: A | Discharge status: Home / Self Care | Payer: MEDICAID | Attending: PHYSICIAN ASSISTANT | Admitting: PHYSICIAN ASSISTANT

## 2023-03-18 ENCOUNTER — Ambulatory Visit (INDEPENDENT_AMBULATORY_CARE_PROVIDER_SITE_OTHER): Payer: MEDICAID | Admitting: NURSE PRACTITIONER, FAMILY

## 2023-03-18 VITALS — BP 135/82 | HR 102 | Ht 74.0 in | Wt 275.0 lb

## 2023-03-18 DIAGNOSIS — S81812A Laceration without foreign body, left lower leg, initial encounter: Secondary | ICD-10-CM | POA: Insufficient documentation

## 2023-03-18 DIAGNOSIS — E782 Mixed hyperlipidemia: Secondary | ICD-10-CM

## 2023-03-18 DIAGNOSIS — E669 Obesity, unspecified: Secondary | ICD-10-CM

## 2023-03-18 DIAGNOSIS — W010XXA Fall on same level from slipping, tripping and stumbling without subsequent striking against object, initial encounter: Secondary | ICD-10-CM

## 2023-03-18 DIAGNOSIS — R0609 Other forms of dyspnea: Secondary | ICD-10-CM

## 2023-03-18 DIAGNOSIS — Y990 Civilian activity done for income or pay: Secondary | ICD-10-CM | POA: Insufficient documentation

## 2023-03-18 DIAGNOSIS — Z720 Tobacco use: Secondary | ICD-10-CM

## 2023-03-18 DIAGNOSIS — S80812A Abrasion, left lower leg, initial encounter: Secondary | ICD-10-CM | POA: Insufficient documentation

## 2023-03-18 DIAGNOSIS — F17200 Nicotine dependence, unspecified, uncomplicated: Secondary | ICD-10-CM

## 2023-03-18 DIAGNOSIS — W25XXXA Contact with sharp glass, initial encounter: Secondary | ICD-10-CM | POA: Insufficient documentation

## 2023-03-18 MED ORDER — LIDOCAINE HCL 10 MG/ML (1 %) INJECTION SOLUTION
15.0000 mL | INTRAMUSCULAR | Status: AC
Start: 2023-03-18 — End: 2023-03-18
  Administered 2023-03-18: 6 mL via INTRADERMAL
  Filled 2023-03-18: qty 20

## 2023-03-18 MED ORDER — BACITRACIN 500 UNIT/GRAM TOPICAL PACKET
PACK | CUTANEOUS | Status: AC
Start: 2023-03-18 — End: 2023-03-18
  Filled 2023-03-18: qty 2

## 2023-03-18 NOTE — ED Nurses Note (Signed)
Patient discharged home with family.  AVS reviewed with patient/care giver.  A written copy of the AVS and discharge instructions was given to the patient/care giver.  Questions sufficiently answered as needed.  Patient/care giver encouraged to follow up with PCP as indicated.  In the event of an emergency, patient/care giver instructed to call 911 or go to the nearest emergency room.

## 2023-03-18 NOTE — Progress Notes (Signed)
Cardiology [facility] [address]  [address]  [place] [address]  [phone]    Cardiology  New Patient Clinic Note    Name: [name] [name]   DOB: Jun 18, 1966  [57 y.o. male]   MRN: [id number]       Visit Date: 03/18/2023   Referring: Cecilie Kicks, DO  [address]  [place],  [place] [address]   PCP: Cecilie Kicks, DO       Reason For Visit:  Initial office visit with cardiology.  Chief Complaint: New Patient (evaluation edema shortness for breath on exertion)    Information provided by: patient      History of Present Illness    [name] [name] is a 57 y.o. White male who presents the clinic for evaluation of exertional dyspnea and lower extremity swelling.  He has a past medical history of chronic low back pain secondary to a work-related injury years ago and most recently presented to the ER with progressive worsening lower extremity weakness and was found to have high-grade cervical stenosis which he underwent an anterior cervical diskectomy 02/18/2023.  He does state that since that time, his exertional dyspnea and extremity swelling is improving.  He does have a history of COPD and was discharged on oxygen which he states his SpO2 runs in the low 90s at home and he has not been wearing the oxygen.  He does have a history of kidney stones, hyperlipidemia and tobacco dependence.  Since surgery, he has been vaping.  He denies a family history of heart disease.    He has been sedentary due to his chronic pain issues and follows up with Neurosurgery.  He would like to start being more active once he gets there clearance.  He denies chest pain.  He denies palpitations, near-syncope or syncope.    TTE 01/23/2023 revealed mild LVH with a preserved EF.      Subjective     Review of Systems  Other than ROS in the HPI, all other systems were negative.    Allergies  Allergies   Allergen Reactions    Naproxen Nausea/ Vomiting       Medications    Current Outpatient Medications    Medication Sig    albuterol (PROVENTIL) 2.5 mg/0.5 mL Inhalation Solution for Nebulization Take 0.5 mL (2.5 mg total) by nebulization Three times a day    allopurinoL (ZYLOPRIM) 100 mg Oral Tablet Take 1 Tablet (100 mg total) by mouth Twice daily for 60 days    budesonide-formoterol (SYMBICORT) 160-4.5 mcg/actuation Inhaler Take 2 Puffs by inhalation Twice daily    buprenorphine HCl/naloxone HCl (SUBOXONE SL) Place under the tongue ASKED PATIENT WHAT THE MG WAS ON THIS MEDICATION AND He STATES THAT He DOES NOT HAVE A PRESCRIPTION FOR IT He BUYS IT OFF THE "[employer]"    gabapentin (NEURONTIN) 300 mg Oral Capsule Take 1 Capsule (300 mg total) by mouth Twice daily for 30 days    glucosam-chon-msm1-C-mang (OSTEO BI-FLEX TRIPLE STRENGTH) 750 mg-644 mg- 30 mg-1 mg Oral Tablet Take 2 Tablets by mouth Once a day    Ibuprofen (MOTRIN) 200 mg Oral Tablet Take 4 Tablets (800 mg total) by mouth Twice daily    Magnesium Oxide 500 mg magnesium Oral Tablet Take 1 Tablet (500 mg total) by mouth Once a day    multivit,cal,mn/folic/D3/lycop (ONE A DAY MEN COMPLETE ORAL) Take 1 Tablet by mouth Once a day    omega-3 fatty acid - fish oil (  FISH OIL) 340-1,000 mg Oral Capsule Take 1 Capsule by mouth Twice daily    tiotropium bromide (SPIRIVA RESPIMAT) 2.5 mcg/actuation Inhalation Mist oral inhaler Take 2 Inhalations (2 Puffs total) by inhalation Once a day for 30 days    vitamin B complex Oral Tablet Take 1 Tablet by mouth Once a day        There are no discontinued medications.      History    Past Medical History:   Diagnosis Date    COPD (chronic obstructive pulmonary disease) (CMS HCC)     Gout     Kidney stones     Low back pain             Past Surgical History:   Procedure Laterality Date    DISC REMOVAL  02/18/2023    C3    DISCECTOMY SPINE CERVICAL ANTERIOR MICRO WITH FUSION AND INSTRUMENTATION 1 LEVEL N/A 02/18/2023    Performed by Mariann Laster, MD at [facility] OR [address]    HX CERVICAL SPINE SURGERY      HX FOOT SURGERY Left      jont implant left toe    HX HERNIA REPAIR      LITHOTRIPSY         Family Medical History:       Problem Relation (Age of Onset)    COPD Mother              Social History     Socioeconomic History    Marital status: Divorced   Tobacco Use    Smoking status: Former     Current packs/day: 1.00     Types: Cigarettes    Smokeless tobacco: Never   Vaping Use    Vaping status: Every Day    Substances: Nicotine   Substance and Sexual Activity    Alcohol use: Not Currently     Comment: no ETOH since early november 2023    Drug use: Yes     Comment: pt states suboxone    Sexual activity: Not Currently     Social Determinants of Health     Social Connections: Low Risk  (02/15/2023)    Social Connections     SDOH Social Isolation: 5 or more times a week         Functional capacity:  Sedentary (<4 mets)    Nutrition:  Healthy diet    Gender Specific Risk Factors:  Not applicable for this patient.     Objective       Physical Exam    BP 135/82 (Site: Left, Patient Position: Sitting)   Pulse (!) 102   Ht 1.88 m (6\' 2" )   Wt 125 kg (275 lb)   SpO2 96%   BMI 35.31 kg/m         Constitutional: AA&O X 3, obese, in no acute distress  Eyes: Conjunctiva clear, Pupils equal, round and reactive to light  HENT: Head is normocephalic, atraumatic   Neck: Normal ROM, Supple, symmetrical  Respiratory: Effort normal, clear to auscultation bilaterally.  Cardiovascular: Regular heart rate and rhythm; S1 and S2 normal; no murmur, click, rub or gallop  Gastrointestinal: Bowel sounds normal; soft, non distended non-tender to palpation  Extremities: extremities normal, atraumatic, no cyanosis, mild lower extremity edema  Integumentary:  Skin warm and dry  Neurologic: Grossly normal, no focal neuro deficit, normal coordination and gait  Psychiatric: normal affect and speech.       Laboratory  I have reviewed the patient's  lab values and culture results. Pertinent results are below:    CBC    Lab Results   Component Value Date    WBC 8.9  02/20/2023    HGB 16.1 02/20/2023    HCT 49.7 02/20/2023    PLTCNT 203 02/20/2023    RBC 5.11 02/20/2023    MCV 97.3 02/20/2023    MCHC 32.4 02/20/2023    MCH 31.5 02/20/2023    RDW 14.1 02/14/2023    MPV 9.4 02/18/2023        Comprehensive Metabolic Profile   Lab Results   Component Value Date    SODIUM 137 02/20/2023    POTASSIUM 4.4 02/28/2023    CHLORIDE 97 02/20/2023    CO2 35 (H) 02/20/2023    ANIONGAP 5 02/20/2023    BUN 28 (H) 02/20/2023    CREATININE 0.84 02/20/2023    GLUCOSE 128 (H) 02/20/2023      Lab Results   Component Value Date    ALBUMIN 3.8 02/15/2023    TOTALPROTEIN 7.7 02/15/2023    AST 23 02/15/2023    ALT 23 02/15/2023          Lab Results   Component Value Date    BNP 14 02/17/2023    TSH 0.357 02/15/2023    INR 0.94 02/15/2023       Lab Results   Component Value Date    TRIG 206 (H) 12/05/2022    HDLCHOL 42 (L) 12/05/2022    LDLCHOL 131 (H) 12/05/2022    CHOLESTEROL 210 (H) 12/05/2022       Diagnostics    03/08/2023 carotid duplex  Conclusions: Right and left internal carotid arteries with less than 50% mild stenosis.Bilateral external carotid arteries without significant disease. Bilateral vertebral arteries patent and with antegrade flow.     02/15/2023 EKG  Normal sinus rhythm  Septal infarct , age undetermined  Abnormal ECG    01/23/2023 TTE  Conclusions:  The left ventricular ejection fraction by visual assessment is estimated to be 55-60%.  Mild left ventricular hypertrophy.  There is mild tricuspid regurgitation.         Assessment and Plan:    Arvie is a 57 y.o. male with:     Assessment/Plan   1. DOE (dyspnea on exertion)    2. Tobacco dependence    3. Mixed hyperlipidemia    4. Obesity (BMI 35.0-39.9 without comorbidity)      1. Exertional dyspnea  -EKG reveals sinus rhythm with nonspecific T-wave abnormality  -recommend ischemic evaluation and patient will be unable to achieve target heart rate on the treadmill due to chronic back and neck pain and therefore will be scheduled for  Lexiscan    2. Tobacco dependence  -tobacco and vaping cessation strongly advised for CVD risk reduction    3. Mixed hyperlipidemia  -discussed the importance of following a heart healthy diet    4. Obesity  -plans to start making healthy lifestyle changes and increasing his activity when cleared from a neurosurgical standpoint    Orders:     Orders Placed This Encounter    ECG - ADD ON IN CLINIC - SAME DAY    MYOCARDIAL PERFUSION COMPLETE       Follow up:  Return in about 3 months (around 06/18/2023).    I independently of the faculty provider spent a total of 35 minutes in direct/indirect care of this patient including initial evaluation, review of laboratory, radiology, diagnostic studies, review of medical record, order entry and coordination  of care.      Disclaimer: This note was partially created using voice recognition software and is inherently subject to errors including those of syntax and "sound alike" substitutions which may escape proof reading. In these instances, original meaning may be extrapolated by the contextual derivation.      Seymour Bars, APRN,FNP-BC    [facility]

## 2023-03-18 NOTE — ED Nurses Note (Signed)
Bacitracin ointment applied to laceration

## 2023-03-18 NOTE — ED Triage Notes (Signed)
Pt was loading windows into a trailer and his left foot went through 5 windows. Pt has a laceration to left lower leg.

## 2023-03-19 LAB — COLOGUARD® COLON CANCER SCREEN: COLOGUARD RESULT: NEGATIVE

## 2023-03-19 NOTE — ED Provider Notes (Signed)
[facility]  Emergency Department      Name: [name] [name]  Age and Gender: 57 y.o. male  Date of Birth: 04-02-1966  Date of Service: 03/18/2023   MRN: [id number]  PCP: Cecilie Kicks, DO    Chief Complaint   Patient presents with    Laceration       HPI:    [name] [name] is a 57 y.o. male presenting to the ED with complaints of a left leg laceration.  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  He states his left lower leg went through approximately 5 broken windows.  He states he has multiple scrapes to his leg but there is 1 large laceration.  He denies any other injuries.  He states his tetanus immunization is up-to-date.  He reports medication allergy to naproxen.        ROS:  All systems reviewed and are negative, unless stated in the HPI.      Below pertinent information reviewed with patient and/or EMR:  Past Medical History:   Diagnosis Date    COPD (chronic obstructive pulmonary disease) (CMS HCC)     Gout     Kidney stones     Low back pain      Medications Prior to Admission       Prescriptions    albuterol (PROVENTIL) 2.5 mg/0.5 mL Inhalation Solution for Nebulization    Take 0.5 mL (2.5 mg total) by nebulization Three times a day    allopurinoL (ZYLOPRIM) 100 mg Oral Tablet    Take 1 Tablet (100 mg total) by mouth Twice daily for 60 days    budesonide-formoterol (SYMBICORT) 160-4.5 mcg/actuation Inhaler    Take 2 Puffs by inhalation Twice daily    buprenorphine HCl/naloxone HCl (SUBOXONE SL)    Place under the tongue ASKED PATIENT WHAT THE MG WAS ON THIS MEDICATION AND He STATES THAT He DOES NOT HAVE A PRESCRIPTION FOR IT He BUYS IT OFF THE "[employer]"    gabapentin (NEURONTIN) 300 mg Oral Capsule    Take 1 Capsule (300 mg total) by mouth Twice daily for 30 days    glucosam-chon-msm1-C-mang (OSTEO BI-FLEX TRIPLE STRENGTH) 750 mg-644 mg- 30 mg-1 mg Oral Tablet    Take 2 Tablets by mouth Once a day    Ibuprofen (MOTRIN) 200 mg Oral  Tablet    Take 4 Tablets (800 mg total) by mouth Twice daily    Magnesium Oxide 500 mg magnesium Oral Tablet    Take 1 Tablet (500 mg total) by mouth Once a day    multivit,cal,mn/folic/D3/lycop (ONE A DAY MEN COMPLETE ORAL)    Take 1 Tablet by mouth Once a day    omega-3 fatty acid - fish oil (FISH OIL) 340-1,000 mg Oral Capsule    Take 1 Capsule by mouth Twice daily    tiotropium bromide (SPIRIVA RESPIMAT) 2.5 mcg/actuation Inhalation Mist oral inhaler    Take 2 Inhalations (2 Puffs total) by inhalation Once a day for 30 days    vitamin B complex Oral Tablet    Take 1 Tablet by mouth Once a day          Allergies   Allergen Reactions    Naproxen Nausea/ Vomiting     Past Surgical History:   Procedure Laterality Date    DISC REMOVAL  02/18/2023    C3    HX CERVICAL SPINE SURGERY      HX  FOOT SURGERY Left     jont implant left toe    HX HERNIA REPAIR      LITHOTRIPSY       Family Medical History:       Problem Relation (Age of Onset)    COPD Mother          Social History     Tobacco Use    Smoking status: Former     Current packs/day: 1.00     Types: Cigarettes    Smokeless tobacco: Never   Vaping Use    Vaping status: Every Day    Substances: Nicotine   Substance Use Topics    Alcohol use: Not Currently     Comment: no ETOH since early november 2023    Drug use: 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    Constitutional:  No acute distress.  Alert and oriented to person, place, and time.   HENT:   Head: Normocephalic and atraumatic.   Mouth/Throat: Oropharynx is clear and moist.   Eyes: EOMI, PERRL   Neck: Trachea midline. Neck supple.  Cardiovascular: RRR, No murmurs, rubs or gallops.  Intact distal pulses.  Pulmonary/Chest: BS equal bilaterally. No respiratory distress. No wheezes, rales or chest tenderness.  No tachypnea, retractions or accessory muscle use.  Musculoskeletal: No edema, tenderness or deformity.  Strength 5/5 in all extremities  Skin: warm and dry. No rash, erythema, pallor or cyanosis.  Multiple superficial abrasions to the left lower leg, 5 cm linear laceration to the mid posterior left calf, no foreign bodies, bleeding controlled, 2+ left PT and DP pulses, sensation intact in the left lower extremity.  Psychiatric: normal mood and affect. Behavior is normal.   Neurological: Patient keenly alert and responsive, CN II-XII grossly intact, moving all extremities equally and fully, normal gait    Labs:   Labs Ordered/Reviewed - No data to display    Imaging:  XR TIBIA-FIBULA LEFT   Final Result by Edi, Radresults In (05/13 2019)   NEGATIVE TIBIA AND FIBULA                Radiologist location ID: [id number]               MDM/Course:  Patient is a 57 year old male presenting to ED with complaints of left leg laceration.  Vital signs stable.  Left tib-fib x-ray negative.  See separate procedure note for laceration repair.  Patient instructed to keep the area clean and dry.  Instructed to follow-up in 10-14 days for suture removal.  Instructed to follow up with PCP and return to ED sooner with any new or worsening symptoms.  Patient discharged home in stable condition.           Clinical Impression:     Clinical Impression   Laceration of left lower leg (Primary)       Medications given:  Medications Administered in the ED   lidocaine 1% injection (6 mL Intradermal Given 03/18/23 2052)           Disposition: Discharged  Current Discharge Medication List        CONTINUE these medications - NO CHANGES were made during your visit.        Details   albuterol 2.5 mg/0.5 mL Solution for Nebulization  Commonly known as: PROVENTIL   2.5 mg, Nebulization, 3 TIMES DAILY  Refills: 0      allopurinoL 100 mg Tablet  Commonly known as: ZYLOPRIM   100 mg, Oral, 2 TIMES DAILY  Qty: 60 Tablet  Refills: 1     Fish OiL 340-1,000 mg Capsule  Generic drug: omega-3 fatty acid - fish oil   1 Capsule, Oral, 2 TIMES DAILY  Refills: 0     gabapentin 300 mg Capsule  Commonly known as: NEURONTIN   300 mg, Oral, 2 TIMES DAILY  Qty: 60 Capsule  Refills: 0     glucosam-chon-msm1-C-mang 750 mg-644 mg- 30 mg-1 mg Tablet  Commonly known as: OSTEO BI-FLEX TRIPLE STRENGTH   2 Tablets, Oral, DAILY  Refills: 0     Ibuprofen 200 mg Tablet  Commonly known as: MOTRIN   800 mg, Oral, 2 TIMES DAILY  Refills: 0     Magnesium Oxide 500 mg magnesium Tablet   500 mg, Oral, DAILY  Refills: 0     ONE A DAY MEN COMPLETE ORAL   1 Tablet, Oral, DAILY  Refills: 0     Spiriva Respimat 2.5 mcg/actuation oral inhaler  Generic drug: tiotropium bromide   2 Puffs, Inhalation, DAILY  Qty: 4 g  Refills: 0     SUBOXONE SL   Sublingual, ASKED PATIENT WHAT THE MG WAS ON THIS MEDICATION AND He STATES THAT He DOES NOT HAVE A PRESCRIPTION FOR IT He BUYS IT OFF THE "[employer]"  Refills: 0     Symbicort 160-4.5 mcg/actuation oral inhaler  Generic drug: budesonide-formoteroL   Take 2 Puffs by inhalation Twice daily  Qty: 10.2 g  Refills: 0     vitamin B complex Tablet   1 Tablet, Oral, DAILY  Refills: 0              Follow up:    Cecilie Kicks, DO  [address]  [place] [address]  [phone]    Call       [facility]  [address] [place] [address]  [phone]    If symptoms worsen, For suture removal in 10-14 days      Parts of this patients chart were completed in a retrospective fashion due to simultaneous direct patient care activities in the Emergency Department.   This note was partially generated using MModal Fluency Direct system, and there may be some incorrect words, spellings, and punctuation that were not noted in checking the note before saving.      Brien Few PA-C

## 2023-03-19 NOTE — Procedures (Signed)
Lac Repair    Date/Time: 03/18/2023 6:54 PM    Performed by: Brien Few, PA-C  Authorized by: Brien Few, PA-C    Consent:     Consent obtained:  Verbal    Consent given by:  Patient    Risks, benefits, and alternatives were discussed: yes      Risks discussed:  Infection, need for additional repair, pain, poor cosmetic result, poor wound healing and nerve damage    Alternatives discussed:  No treatment  Universal protocol:     Imaging studies available: yes      Patient identity confirmed:  Verbally with patient and provided demographic data  Anesthesia:     Anesthesia method:  Local infiltration    Local anesthetic:  Lidocaine 1% w/o epi  Laceration details:     Location:  Leg    Leg location:  L lower leg    Length (cm):  5  Pre-procedure details:     Preparation:  Imaging obtained to evaluate for foreign bodies and patient was prepped and draped in usual sterile fashion  Exploration:     Hemostasis achieved with:  Direct pressure    Imaging obtained: x-ray      Imaging outcome: foreign body not noted      Wound exploration: wound explored through full range of motion and entire depth of wound visualized      Wound extent: no foreign bodies/material noted      Contaminated: no    Treatment:     Area cleansed with:  Saline and chlorhexidine    Amount of cleaning:  Standard    Irrigation solution:  Sterile water    Irrigation method:  Syringe  Skin repair:     Repair method:  Sutures    Suture size:  4-0    Suture material:  Nylon    Number of sutures:  7  Approximation:     Approximation:  Close  Repair type:     Repair type:  Simple  Post-procedure details:     Dressing:  Antibiotic ointment and non-adherent dressing    Procedure completion:  Tolerated well, no immediate complications      Brien Few, PA-C   5/14/202410:41

## 2023-03-22 ENCOUNTER — Ambulatory Visit
Admission: RE | Admit: 2023-03-22 | Discharge: 2023-03-22 | Disposition: A | Discharge status: Home / Self Care | Payer: MEDICAID | Source: Ambulatory Visit | Attending: Neurology | Admitting: Neurology

## 2023-03-22 ENCOUNTER — Other Ambulatory Visit: Payer: Self-pay

## 2023-03-22 DIAGNOSIS — R269 Unspecified abnormalities of gait and mobility: Secondary | ICD-10-CM | POA: Insufficient documentation

## 2023-03-22 DIAGNOSIS — R29898 Other symptoms and signs involving the musculoskeletal system: Secondary | ICD-10-CM | POA: Insufficient documentation

## 2023-03-22 DIAGNOSIS — M545 Low back pain, unspecified: Secondary | ICD-10-CM | POA: Insufficient documentation

## 2023-03-22 DIAGNOSIS — M5416 Radiculopathy, lumbar region: Secondary | ICD-10-CM | POA: Insufficient documentation

## 2023-03-26 NOTE — Result Encounter Note (Signed)
Patient notified. 03/26/23 tjm

## 2023-03-26 NOTE — Progress Notes (Signed)
NEUROSURGERY, PHYSICIAN OFFICE CENTER  [address]  [place] [address]  Operated by [facility]  Progress Note    Name: [name] [name] MRN:  [id number]   Date: 03/27/2023 DOB:  July 24, 1966 (57 y.o.)           Referring Provider:   No referring provider defined for this encounter.      Subjective:   This is a 57 y.o. 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 He recently underwent a LS mri.    The patient feels that he is a lot better then he was before surgery but still has residual numbness and trouble making his arms/hands and legs do what he wants.    He had a fall last week (no worsening). He denies incisional issues with mild swallowing problems.     Hx of LS mri (back and left greater then right leg lateral pain).    Objective:   Vital Signs:  BP 136/79   Pulse 86   Temp 36.8 C (98.2 F) (Thermal Scan)   Ht 1.88 m (6\' 2" )   Wt 124 kg (274 lb 4 oz)   BMI 35.21 kg/m       Constitutional  General appearance: Normal  Cardiovascular:   Peripheral vascular system: Normal  Musculoskeletal  Gait and Station: : Normal  Muscle strength (upper extremities): : Normal  Muscle strength (lower extremities): : Normal  Muscle tone (upper extremities): : Normal  Muscle tone (lower extremities): : Normal  Sensation: Normal  Deep tendon reflexes upper and lower extremities: Normal  Hoffman's reflex: Left: negative Right: negative  Ankle clonus:Left : Not present Right Not present    Incisions well healed    Neurological  Orientation: Normal  Recent and remote memory: Normal  Attention span and concentration: Normal  Language: Normal  Fund of knowledge: Normal    Data reviewed    1. Cervical xray performed on 03/27/2023 on [facility] PACS and it shows superior C3 screw has backed out somewhat (reviewed with Michail Jewels who felt this was consistent with subsidence). Will continue to follow    LS mri 03/22/23 shows L4-L5 hnp and significant stenosis, with  grade 1 lithesis L5-S1    Discussions with other providers:     Assessment:    Assessment/Plan   1. Cervical stenosis of spine    2. Lower back pain    3. Lumbar radicular pain    4. HNP (herniated nucleus pulposus), lumbar    5. Lumbar stenosis    6. S/P cervical spinal fusion        Recommendations:  RTC in 4 weeks with cervical xrays with Michail Jewels. Will discuss arranging surgery for a L4-S1 fusion at that appt.  He will call sooner with worsening.  The patient was seen independently with the co-signing faculty present in clinic.   Tonie Griffith, PA-C

## 2023-03-27 ENCOUNTER — Encounter (INDEPENDENT_AMBULATORY_CARE_PROVIDER_SITE_OTHER): Payer: Self-pay | Admitting: Physician Assistant

## 2023-03-27 ENCOUNTER — Other Ambulatory Visit: Payer: Self-pay

## 2023-03-27 ENCOUNTER — Inpatient Hospital Stay (INDEPENDENT_AMBULATORY_CARE_PROVIDER_SITE_OTHER)
Admission: RE | Admit: 2023-03-27 | Discharge: 2023-03-27 | Disposition: A | Discharge status: Home / Self Care | Payer: MEDICAID | Source: Ambulatory Visit

## 2023-03-27 ENCOUNTER — Ambulatory Visit: Payer: MEDICAID | Attending: Physician Assistant | Admitting: Physician Assistant

## 2023-03-27 VITALS — BP 136/79 | HR 86 | Temp 98.2°F | Ht 74.0 in | Wt 274.3 lb

## 2023-03-27 DIAGNOSIS — M4802 Spinal stenosis, cervical region: Secondary | ICD-10-CM | POA: Insufficient documentation

## 2023-03-27 DIAGNOSIS — M5126 Other intervertebral disc displacement, lumbar region: Secondary | ICD-10-CM

## 2023-03-27 DIAGNOSIS — M48061 Spinal stenosis, lumbar region without neurogenic claudication: Secondary | ICD-10-CM | POA: Insufficient documentation

## 2023-03-27 DIAGNOSIS — M5116 Intervertebral disc disorders with radiculopathy, lumbar region: Secondary | ICD-10-CM

## 2023-03-27 DIAGNOSIS — M5416 Radiculopathy, lumbar region: Secondary | ICD-10-CM

## 2023-03-27 DIAGNOSIS — M545 Low back pain, unspecified: Secondary | ICD-10-CM | POA: Insufficient documentation

## 2023-03-27 DIAGNOSIS — Z981 Arthrodesis status: Secondary | ICD-10-CM | POA: Insufficient documentation

## 2023-03-28 ENCOUNTER — Ambulatory Visit: Payer: MEDICAID | Attending: Neurology | Admitting: Neurology

## 2023-03-28 ENCOUNTER — Encounter (INDEPENDENT_AMBULATORY_CARE_PROVIDER_SITE_OTHER): Payer: Self-pay | Admitting: Neurology

## 2023-03-28 VITALS — Ht 74.0 in | Wt 274.0 lb

## 2023-03-28 DIAGNOSIS — R269 Unspecified abnormalities of gait and mobility: Secondary | ICD-10-CM

## 2023-03-28 DIAGNOSIS — R29898 Other symptoms and signs involving the musculoskeletal system: Secondary | ICD-10-CM | POA: Insufficient documentation

## 2023-03-28 DIAGNOSIS — M545 Low back pain, unspecified: Secondary | ICD-10-CM

## 2023-03-28 DIAGNOSIS — M542 Cervicalgia: Secondary | ICD-10-CM

## 2023-03-28 DIAGNOSIS — I679 Cerebrovascular disease, unspecified: Secondary | ICD-10-CM

## 2023-03-28 DIAGNOSIS — M5416 Radiculopathy, lumbar region: Secondary | ICD-10-CM | POA: Insufficient documentation

## 2023-03-28 DIAGNOSIS — M4802 Spinal stenosis, cervical region: Secondary | ICD-10-CM | POA: Insufficient documentation

## 2023-03-28 NOTE — Progress Notes (Signed)
NEUROLOGY, [facility]  [address]  [place] [address]  Operated by [facility]     Name: [name] [name] MRN:  [id number]   Date: 03/28/2023 Age: 57 y.o.  DOB: 1966-09-09      Chief Compliant:   Chief Complaint              Follow Up     Cervical Spinal Stenosis     Lumbar Radiculopathy             History of Present Illness:  [name] [name] is a 57 y.o. White male presenting to the office for off Neurontin and does not really see a difference in his pain.  Carotid Doppler was okay.  MRI of the lower back shows fairly significant spinal stenosis 1 level.  He saw Dr. Michail Jewels today in the patient's says they are considering surgery on his lower back.  They are also doing observation of his prior neck surgery as far as hardware..    Past Medical History:   Diagnosis Date    Cervical stenosis of spinal canal     COPD (chronic obstructive pulmonary disease) (CMS HCC)     CVD (cerebrovascular disease)     Gait disturbance     Gout     Kidney stones     Leg weakness, bilateral     Low back pain     Lumbar radiculopathy     Neck pain      Past Surgical History:   Procedure Laterality Date    DISC REMOVAL  02/18/2023    C3    HX CERVICAL SPINE SURGERY      HX FOOT SURGERY Left     jont implant left toe    HX HERNIA REPAIR      LITHOTRIPSY       Current Outpatient Medications   Medication Sig    albuterol (PROVENTIL) 2.5 mg/0.5 mL Inhalation Solution for Nebulization Take 0.5 mL (2.5 mg total) by nebulization Three times a day    allopurinoL (ZYLOPRIM) 100 mg Oral Tablet Take 1 Tablet (100 mg total) by mouth Twice daily for 60 days    budesonide-formoterol (SYMBICORT) 160-4.5 mcg/actuation Inhaler Take 2 Puffs by inhalation Twice daily    buprenorphine HCl/naloxone HCl (SUBOXONE SL) Place under the tongue ASKED PATIENT WHAT THE MG WAS ON THIS MEDICATION AND He STATES THAT He DOES NOT HAVE A PRESCRIPTION FOR IT He BUYS IT OFF THE "[employer]"    gabapentin (NEURONTIN) 300 mg Oral Capsule Take 1  Capsule (300 mg total) by mouth Twice daily for 30 days    glucosam-chon-msm1-C-mang (OSTEO BI-FLEX TRIPLE STRENGTH) 750 mg-644 mg- 30 mg-1 mg Oral Tablet Take 2 Tablets by mouth Once a day    Ibuprofen (MOTRIN) 200 mg Oral Tablet Take 4 Tablets (800 mg total) by mouth Twice daily    Magnesium Oxide 500 mg magnesium Oral Tablet Take 1 Tablet (500 mg total) by mouth Once a day    multivit,cal,mn/folic/D3/lycop (ONE A DAY MEN COMPLETE ORAL) Take 1 Tablet by mouth Once a day    omega-3 fatty acid - fish oil (FISH OIL) 340-1,000 mg Oral Capsule Take 1 Capsule by mouth Twice daily    tiotropium bromide (SPIRIVA RESPIMAT) 2.5 mcg/actuation Inhalation Mist oral inhaler Take 2 Inhalations (2 Puffs total) by inhalation Once a day for 30 days    vitamin B complex Oral Tablet Take 1 Tablet by mouth Once a day     Allergies  Allergen Reactions    Naproxen Nausea/ Vomiting         Review of Systems:  All significant ROS addressed in the HPI.      Physical Exam:  Ht 1.88 m (6\' 2" )   Wt 124 kg (274 lb)   BMI 35.18 kg/m         Neurological:  Neurological Exam - Alert       Neurological - HIGHER MENTAL FUNCTIONS:  Higher Mental Function Examination Findings - Normal       Neurological - CRANIAL NERVES EXAM:  Cranial Nerves Examination Findings - Normal       Neurological - MOTOR EXAM   Motor Examination Findings - Normal       Neurological - COORDINATION TESTS:  Coordination Examination Findings - Normal       Neurological - MENINGEAL TESTS:  Meningeal Irritation Findings -    None      Lab:  COMPREHENSIVE METABOLIC PANEL   Lab Results   Component Value Date    SODIUM 137 02/20/2023    POTASSIUM 4.4 02/28/2023    CHLORIDE 97 02/20/2023    CO2 35 (H) 02/20/2023    ANIONGAP 5 02/20/2023    BUN 28 (H) 02/20/2023    CREATININE 0.84 02/20/2023    GLUCOSENF 102 08/16/2021    CALCIUM 9.7 02/20/2023    PHOSPHORUS 3.9 02/17/2023    ALBUMIN 3.8 02/15/2023    TOTALPROTEIN 7.7 02/15/2023    ALKPHOS 88 02/15/2023    AST 23 02/15/2023    ALT  23 02/15/2023    BILIRUBINCON 0.2 02/15/2023            COMPLETE BLOOD COUNT   Lab Results   Component Value Date    WBC 8.9 02/20/2023    HGB 16.1 02/20/2023    HCT 49.7 02/20/2023    PLTCNT 203 02/20/2023    BANDS 1 02/16/2023       DIFFERENTIAL  Lab Results   Component Value Date    PMNS 58.3 02/20/2023    LYMPHOCYTES 4 02/16/2023    MONOCYTES 12.6 02/20/2023    EOSINOPHIL 0 02/16/2023    BASOPHILS 0.2 02/20/2023    BASOPHILS <0.10 02/20/2023    PMNABS 5.19 02/20/2023    LYMPHSABS 2.45 02/20/2023    EOSABS <0.10 02/20/2023    MONOSABS 1.12 (H) 02/20/2023        Lab Results   Component Value Date    CHOLESTEROL 210 (H) 12/05/2022    HDLCHOL 42 (L) 12/05/2022    LDLCHOL 131 (H) 12/05/2022    TRIG 206 (H) 12/05/2022       TSH (uIU/mL)   Date Value   02/15/2023 0.357      No results found for: "ESRTDS", "ESR"  No results found for: "HIV1GENO"  Lab Results   Component Value Date    VITB12 525 02/15/2023    FOLATE 17.0 02/15/2023      No results found for this or any previous visit (from the past [id number] hour(s)).     No results found for: "[facility]"          No results found for this visit on 03/28/23.     Assessment:     ICD-10-CM    1. Gait disturbance  R26.9       2. Cervical stenosis of spine  M48.02       3. Lumbar radiculopathy  M54.16       4. Leg weakness, bilateral  R29.898  5. Lower back pain  M54.50       6. Neck pain  M54.2       7. Cerebral vascular disease  I67.9            PLAN:  I will see him back as needed.  Follow-up with Neurosurgery as directed.  Control risk factors.  Consider antiplatelets.    Follow Up: Return if symptoms worsen or fail to improve.      Testing and/or Medications and or other therapies ordered:  No orders of the defined types were placed in this encounter.

## 2023-03-29 ENCOUNTER — Ambulatory Visit (INDEPENDENT_AMBULATORY_CARE_PROVIDER_SITE_OTHER): Payer: Self-pay | Admitting: INTERNAL MEDICINE - CARDIOVASCULAR DISEASE

## 2023-04-02 ENCOUNTER — Telehealth (INDEPENDENT_AMBULATORY_CARE_PROVIDER_SITE_OTHER): Payer: Self-pay | Admitting: Family Medicine

## 2023-04-03 ENCOUNTER — Other Ambulatory Visit (INDEPENDENT_AMBULATORY_CARE_PROVIDER_SITE_OTHER): Payer: Self-pay | Admitting: Family Medicine

## 2023-04-03 DIAGNOSIS — G5600 Carpal tunnel syndrome, unspecified upper limb: Secondary | ICD-10-CM

## 2023-04-03 NOTE — Telephone Encounter (Signed)
Patient notified 04/03/23 sms

## 2023-04-03 NOTE — Telephone Encounter (Signed)
He had an EMG 12/12/2022 sms

## 2023-04-04 ENCOUNTER — Ambulatory Visit (INDEPENDENT_AMBULATORY_CARE_PROVIDER_SITE_OTHER): Payer: Self-pay | Admitting: Neurology

## 2023-04-05 ENCOUNTER — Telehealth (HOSPITAL_COMMUNITY): Payer: Self-pay | Admitting: NURSE PRACTITIONER, FAMILY

## 2023-04-05 DIAGNOSIS — M47812 Spondylosis without myelopathy or radiculopathy, cervical region: Secondary | ICD-10-CM

## 2023-04-08 ENCOUNTER — Other Ambulatory Visit: Payer: Self-pay

## 2023-04-08 ENCOUNTER — Ambulatory Visit (HOSPITAL_COMMUNITY): Payer: MEDICAID

## 2023-04-08 ENCOUNTER — Other Ambulatory Visit (HOSPITAL_COMMUNITY): Payer: MEDICAID

## 2023-04-08 ENCOUNTER — Inpatient Hospital Stay
Admission: RE | Admit: 2023-04-08 | Discharge: 2023-04-08 | Disposition: A | Discharge status: Home / Self Care | Payer: MEDICAID | Source: Ambulatory Visit | Attending: NURSE PRACTITIONER, FAMILY | Admitting: NURSE PRACTITIONER, FAMILY

## 2023-04-08 DIAGNOSIS — R0609 Other forms of dyspnea: Secondary | ICD-10-CM | POA: Insufficient documentation

## 2023-04-08 DIAGNOSIS — E782 Mixed hyperlipidemia: Secondary | ICD-10-CM | POA: Insufficient documentation

## 2023-04-08 DIAGNOSIS — F17200 Nicotine dependence, unspecified, uncomplicated: Secondary | ICD-10-CM

## 2023-04-08 DIAGNOSIS — I493 Ventricular premature depolarization: Secondary | ICD-10-CM

## 2023-04-08 MED ORDER — REGADENOSON 0.4 MG/5 ML INTRAVENOUS SYRINGE
0.4000 mg | INJECTION | INTRAVENOUS | Status: AC
Start: 2023-04-08 — End: 2023-04-08
  Administered 2023-04-08: 0.4 mg via INTRAVENOUS

## 2023-04-09 LAB — MYOCARDIAL PERFUSION COMPLETE
Baseline HR: 89 {beats}/min
Estimated workload: 4.7 METS
Exercise duration (min): 3 min
Exercise duration (sec): 8 s
LEXI STRESS PEAK SBP: 178
Nuc Stress EF: 44 %
Peak DBP: 90
Peak Diastolic BP for Stress Tests: 90 mm/Hg
Peak Systolic BP Stress Test: 178 mm/Hg
Post peak HR: 125 {beats}/min
RPP: 22250
ST DEPRESSION - BASELINE: 0 mm
ST DEPRESSION - RECOVERY: 0
ST DEPRESSION - STRESS: 0 mm
TID: 1.8
Target HR: 137 {beats}/min

## 2023-04-10 ENCOUNTER — Telehealth (INDEPENDENT_AMBULATORY_CARE_PROVIDER_SITE_OTHER): Payer: Self-pay | Admitting: NURSE PRACTITIONER, FAMILY

## 2023-04-10 NOTE — Telephone Encounter (Signed)
Call placed to patient and discuss results of stress test.  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  He denies any chest pain or worsening exertional dyspnea beyond his baseline which he attributes to COPD.  I did advise that he start a blood pressure diary 3 times a week and bring to the next office visit in August.  I strongly encouraged vaping cessation,

## 2023-04-17 LAB — ECG W INTERP (AMB USE ONLY)(MUSE,IN CLINIC)
Atrial Rate: 91 {beats}/min
Calculated P Axis: 70 degrees
Calculated R Axis: 46 degrees
Calculated T Axis: 28 degrees
PR Interval: 160 ms
QRS Duration: 94 ms
QT Interval: 364 ms
QTC Calculation: 447 ms
Ventricular rate: 91 {beats}/min

## 2023-04-18 ENCOUNTER — Encounter (INDEPENDENT_AMBULATORY_CARE_PROVIDER_SITE_OTHER): Payer: Self-pay | Admitting: Plastic and Reconstructive Surgery

## 2023-04-18 ENCOUNTER — Other Ambulatory Visit: Payer: Self-pay

## 2023-04-18 ENCOUNTER — Ambulatory Visit (INDEPENDENT_AMBULATORY_CARE_PROVIDER_SITE_OTHER): Payer: MEDICAID | Admitting: Plastic and Reconstructive Surgery

## 2023-04-18 VITALS — Ht 74.0 in | Wt 274.0 lb

## 2023-04-18 DIAGNOSIS — G5603 Carpal tunnel syndrome, bilateral upper limbs: Secondary | ICD-10-CM

## 2023-04-18 DIAGNOSIS — G5600 Carpal tunnel syndrome, unspecified upper limb: Secondary | ICD-10-CM

## 2023-04-18 DIAGNOSIS — G5601 Carpal tunnel syndrome, right upper limb: Secondary | ICD-10-CM

## 2023-04-18 DIAGNOSIS — G5602 Carpal tunnel syndrome, left upper limb: Secondary | ICD-10-CM

## 2023-04-24 ENCOUNTER — Encounter (INDEPENDENT_AMBULATORY_CARE_PROVIDER_SITE_OTHER): Payer: Self-pay | Admitting: Neurological Surgery

## 2023-04-24 ENCOUNTER — Inpatient Hospital Stay (HOSPITAL_BASED_OUTPATIENT_CLINIC_OR_DEPARTMENT_OTHER)
Admission: RE | Admit: 2023-04-24 | Discharge: 2023-04-24 | Disposition: A | Discharge status: Home / Self Care | Payer: MEDICAID | Source: Ambulatory Visit

## 2023-04-24 ENCOUNTER — Ambulatory Visit: Payer: MEDICAID | Attending: Neurological Surgery | Admitting: Neurological Surgery

## 2023-04-24 ENCOUNTER — Other Ambulatory Visit: Payer: Self-pay

## 2023-04-24 VITALS — BP 139/75 | HR 83 | Temp 97.0°F | Ht 74.0 in | Wt 282.6 lb

## 2023-04-24 DIAGNOSIS — M4802 Spinal stenosis, cervical region: Secondary | ICD-10-CM

## 2023-04-24 DIAGNOSIS — M545 Low back pain, unspecified: Secondary | ICD-10-CM | POA: Insufficient documentation

## 2023-04-24 DIAGNOSIS — M48061 Spinal stenosis, lumbar region without neurogenic claudication: Secondary | ICD-10-CM | POA: Insufficient documentation

## 2023-04-24 DIAGNOSIS — M5416 Radiculopathy, lumbar region: Secondary | ICD-10-CM | POA: Insufficient documentation

## 2023-04-24 DIAGNOSIS — M5126 Other intervertebral disc displacement, lumbar region: Secondary | ICD-10-CM | POA: Insufficient documentation

## 2023-04-24 DIAGNOSIS — Z981 Arthrodesis status: Secondary | ICD-10-CM | POA: Insufficient documentation

## 2023-04-24 DIAGNOSIS — Z01818 Encounter for other preprocedural examination: Secondary | ICD-10-CM | POA: Insufficient documentation

## 2023-04-24 NOTE — Progress Notes (Signed)
[facility] Department of Neurosurgery  History & Physical     Date:  04/24/2023  Age:  57 y.o.  Referring Physician:   No referring provider defined for this encounter.         Subjective:   Chief Complaint:   Chief Complaint   Patient presents with    Follow Up       [name] [name] is a 57 y.o.  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   Last seen in clinic on 03/27/23 and reported that he was a lot better than he was before surgery, but still had residual numbness and trouble making his arms/hands and legs do what he wants. Mild dysphagia. Cervical XR showed superior C3 screw backed out somewhat (reviewed with [name] [name] who felt this was consistent with subsidence). Discussed that we will continue to follow. He also had a low back pain with radiation into bilateral legs (L>R). MRI Lumbosacral showed  L4-L5 hnp and significant stenosis, with grade 1 lithesis L5-S1.      He is here today to discuss L4-S1 Fusion. Chronic back/bilateral leg pain significantly worse over the past year. Leg pain is more severe than back pain and occurs in a L5 distribution. He endorses neurogenic claudication with standing/walking. Pain is most severe with prolonged standing. Pain is slightly improved with leaning over on a shopping cart. With prolonged standing his legs/feet will go numb. Denies bowel/bladder changes.     Conservative TX: Completed back PT multiple times, most recent was last year. Likes the inversion table, does not help long term. No injections. Takes 800mg  Ibuprofen daily.    PMH significant for COPD, CVD, Gout, Kidney stones  Vapes      Current Outpatient Medications:     albuterol (PROVENTIL) 2.5 mg/0.5 mL Inhalation Solution for Nebulization, Take 0.5 mL (2.5 mg total) by nebulization Three times a day, Disp: , Rfl:     allopurinoL (ZYLOPRIM) 100 mg Oral Tablet, Take 1 Tablet (100 mg total) by mouth Twice daily for 60 days, Disp: 60 Tablet, Rfl: 1     budesonide-formoterol (SYMBICORT) 160-4.5 mcg/actuation Inhaler, Take 2 Puffs by inhalation Twice daily, Disp: 10.2 g, Rfl: 0    buprenorphine HCl/naloxone HCl (SUBOXONE SL), Place under the tongue ASKED PATIENT WHAT THE MG WAS ON THIS MEDICATION AND He STATES THAT He DOES NOT HAVE A PRESCRIPTION FOR IT He BUYS IT OFF THE "[employer]", Disp: , Rfl:     glucosam-chon-msm1-C-mang (OSTEO BI-FLEX TRIPLE STRENGTH) 750 mg-644 mg- 30 mg-1 mg Oral Tablet, Take 2 Tablets by mouth Once a day, Disp: , Rfl:     Ibuprofen (MOTRIN) 200 mg Oral Tablet, Take 4 Tablets (800 mg total) by mouth Twice daily, Disp: , Rfl:     Magnesium Oxide 500 mg magnesium Oral Tablet, Take 1 Tablet (500 mg total) by mouth Once a day, Disp: , Rfl:     multivit,cal,mn/folic/D3/lycop (ONE A DAY MEN COMPLETE ORAL), Take 1 Tablet by mouth Once a day, Disp: , Rfl:     omega-3 fatty acid - fish oil (FISH OIL) 340-1,000 mg Oral Capsule, Take 1 Capsule by mouth Twice daily, Disp: , Rfl:     tiotropium bromide (SPIRIVA RESPIMAT) 2.5 mcg/actuation Inhalation Mist oral inhaler, Take 2 Inhalations (2 Puffs total) by inhalation Once a day for 30 days, Disp: 4 g, Rfl: 0    vitamin B complex Oral Tablet, Take 1 Tablet by mouth Once a day, Disp: ,  Rfl:      ROS: Denies recent infection, fevers, chills, and chest pain    Objective:   Vital Signs:  BP 139/75   Pulse 83   Temp 36.1 C (97 F) (Thermal Scan)   Ht 1.88 m (6\' 2" )   Wt 128 kg (282 lb 10.1 oz)   BMI 36.29 kg/m     Examination  Constitutional: Well groomed, in no apparent distress  Skin: Cervical incision well healed  Head:  NC/AT  Eyes: PERRL, EOMI, conjunctiva clear  ENT:  Tongue midline, trachea midline  Cardiovascular:    Peripheral vascular system: Pulses palpable, no peripheral edema   Respiratory: Unlabored  Musculoskeletal  Gait and Station: antalgic  Muscle strength (upper extremities): 5/5 bilaterally  Muscle strength (lower extremities): 5/5 bilaterally  Muscle tone (upper extremities):  WNL  Muscle tone (lower extremities): WNL  Sensory: decreased in finger tips  DTR's upper and lower extremities: 2+ bilaterally  Hoffman's reflex: Positive bilaterally  Musculoskeletal tenderness: Not present  Neurological  Level of consciousness: Alert and oriented  Recent and remote memory: Good recall and able to follow commands  Attention span and concentration: Normal in conversation  Language/Speech: No aphasia or dysarthria  Fund of knowledge: Appropriate in this setting  Cranial nerves II to XII grossly intact.       Data reviewed  - Reviewed XR Cervical Spine AP and Lateral done 04/24/23 in [facility] PACS  - Impression: Stable postoperative changes from C3 through C4 ACDF.   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 MILD CENTRAL CANAL AND ASYMMETRIC LATERAL RECESS NARROWING AT L2-3 AND L4-3-4. NO HIGH-GRADE NEURAL FORAMINAL STENOSES.  - Previous charts reviewed    Assessment:  [name] [name] is a 57 y.o.  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 He is doing great from a cervical standpoint. He endorses severe low back/bilateral leg pain with radiculopathy. Imaging shows severe stenosis at L4-5. Surgical intervention was discussed in detail by Dr. Michail [name] and patient would like to proceed.     ICD-10-CM    1. Lumbar stenosis  M48.061       2. S/P cervical spinal fusion  Z98.1       3. Lumbar radicular pain  M54.16       4. HNP (herniated nucleus pulposus), lumbar  M51.26           Recommendations  - Patient's history and films from today's visit were reviewed. Recommendations and plan were explained and discussed during the visit. The patient was given the opportunity to ask questions and they were addressed. [name] [name] is in agreement with the following plan:    - Cervical XR looks good  - Consent obtained for  L4-5 Fusion  - PAT has been ordered and will take place  - RTC s/p lumbar surgery     - The patient has been advised to follow up with their PCP in regards any chronic medical conditions and any non-neurosurgical symptoms that they may have. A copy of the note from today's clinic appointment will be sent to the patient's PCP Riley Lam Midcap, DO on file (confirmed during visit)    The patient was seen as a shared visit with the co-signing faculty.    Mickel Duhamel, APRN,NP-C 04/24/2023, 15:45    With Dr. Michail [name]  I personally saw and evaluated the patient. See mid-level's note for additional details. My findings/participation are 57 year old male status post anterior cervical diskectomy and fusion doing well from that.  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  Based on this I recommend removal of the entire facet joint to adequately decompress the nerves necessitating a need for fusion.  Recommendations are L4-L5 posterior spinal fusion with placement of intervertebral cages, removal of the entire facet joint, and onlay fusion.    Mariann Laster, MD

## 2023-04-25 ENCOUNTER — Inpatient Hospital Stay (HOSPITAL_COMMUNITY)
Admission: RE | Admit: 2023-04-25 | Discharge: 2023-04-25 | Disposition: A | Discharge status: Home / Self Care | Payer: MEDICAID | Source: Ambulatory Visit

## 2023-04-25 ENCOUNTER — Encounter (HOSPITAL_COMMUNITY): Payer: Self-pay | Admitting: Neurological Surgery

## 2023-04-29 ENCOUNTER — Ambulatory Visit: Payer: MEDICAID | Attending: Neurological Surgery | Admitting: NURSE PRACTITIONER

## 2023-04-29 ENCOUNTER — Encounter (INDEPENDENT_AMBULATORY_CARE_PROVIDER_SITE_OTHER): Payer: Self-pay | Admitting: NURSE PRACTITIONER

## 2023-04-29 DIAGNOSIS — R600 Localized edema: Secondary | ICD-10-CM | POA: Insufficient documentation

## 2023-04-29 DIAGNOSIS — M4802 Spinal stenosis, cervical region: Secondary | ICD-10-CM

## 2023-04-29 DIAGNOSIS — M4306 Spondylolysis, lumbar region: Secondary | ICD-10-CM

## 2023-04-29 DIAGNOSIS — G8918 Other acute postprocedural pain: Secondary | ICD-10-CM

## 2023-04-29 DIAGNOSIS — Z87898 Personal history of other specified conditions: Secondary | ICD-10-CM

## 2023-04-29 DIAGNOSIS — M48061 Spinal stenosis, lumbar region without neurogenic claudication: Secondary | ICD-10-CM

## 2023-04-29 DIAGNOSIS — R0609 Other forms of dyspnea: Secondary | ICD-10-CM

## 2023-04-29 DIAGNOSIS — Z720 Tobacco use: Secondary | ICD-10-CM

## 2023-04-29 DIAGNOSIS — Z01818 Encounter for other preprocedural examination: Secondary | ICD-10-CM

## 2023-04-29 DIAGNOSIS — J449 Chronic obstructive pulmonary disease, unspecified: Secondary | ICD-10-CM

## 2023-04-29 HISTORY — DX: Chronic obstructive pulmonary disease, unspecified: J44.9

## 2023-04-29 HISTORY — DX: Personal history of other specified conditions: Z87.898

## 2023-04-29 HISTORY — DX: Other forms of dyspnea: R06.09

## 2023-04-29 NOTE — Patient Instructions (Signed)
MEDICATION INSTRUCTIONS    Take the following medications the morning of surgery:  Tylenol if needed, inhalers as prescribed, allopurinol, Suboxone    Hold the following medications the day of surgery:  none    Patient instructed to hold NSAIDs (Motrin, Advil, Aleve, Ibuprofen, Naprosyn, Meloxicam, Diclofenac, etc.), vitamins, fish oil, and herbal supplements 7 days prior to the procedure.      DIET INSTRUCTIONS   - Regular diet until 8 hours prior to arrival, clear liquids until 2 hours prior to arrival, unless directed otherwise by your surgeon. No oral tobacco or mints for 8 hours prior to arrival.       Visitation Policy  Adult: One (1) approved visitor with patient for registration and waiting area prior to procedure (if space allows for physical distancing).     Pediatric and Obstetric: Two (2) visitors / support persons per patient.

## 2023-04-29 NOTE — H&P (Signed)
Addendum:  none      Perioperative Evaluation Center  Department of Anesthesiology  Optimization Visit  History and Physical     [name] [name], [name] [name], 57 y.o. male Medical Record Number: [id number]   Date of Birth:  [birth year] Date of Service:  04/29/2023    Information Obtained from: patient Surgeon:   Surgeons and Role:     Mariann Laster, MD - Primary     Date of Surgery: 05/16/2023  Planned anesthesia: General   Estimated Case Length: 187 Min     TELEMEDICINE DOCUMENTATION:    Patient Location:  MyChart video visit from home address: [address]  [place] [address]    Patient/family aware of provider location:  yes  Patient/family consent for telemedicine:  yes  Examination observed and performed by:  Sander Radon, FNP-C      CHIEF COMPLAINT  Pre-Operative Optimization Visit for FUSION SPINE POSTERIOR WITH INSTRUMENTATION TLIF AND PLIF:   under General due to  LUMBER SPONDYLOSIS  LUMBAR SPINAL STENOSIS.        RECOMMENDATION  SUMMARY: The surgery is considered elective.  The surgery is considered intermediate intensity .  Recommend  proceeding towards surgery.    Patient has the following outstanding needs for optimization:  None  Patient would benefit from active incentive spirometer, early ambulation following surgery and multi-modal pain management approach.  DVT prophylaxis as appropriate.  Please see addendum for any updates after 04/29/2023.        ASSESSMENT & PLAN  Preoperative exam- scheduled for FUSION SPINE POSTERIOR WITH INSTRUMENTATION TLIF AND PLIF:   Plan- Patient plans to complete preop testing at [facility] around 7/1.   Recommend proceeding with procedure as scheduled.    - EKG ordered by Surgical Service.  - CXR ordered by Surgical Service.  - Labs ordered by Surgical Service.       Lumbar spinal stenosis, spondylosis- s/p anterior cervical diskectomy 02/18/23. Reports improvement of cervical symptoms, and tolerated anesthesia well. Reports chronic lower back and BLE leg pain and numbness  that has worsened over the last year. Following with Neurosurgery.  Plan- Proceed with surgery as scheduled.      COPD-  Denies recent exacerbations, URI, or pneumonia. Denies recent change in respiratory status and reports requiring albuterol inhaler rarely.  No available PFT's. Follows w/ PCP.  Plan-  Continue albuterol, Spiriva, Symbicort inhalers.     Exertional dyspnea, peripheral edema- Evaluated by Cardiology 03/18/23 for complaints of worsened DOE and BLE edema. TTE 01/23/2023:  EF 55-60%, RVSP upper limits of normal, mild TR. Cardiology ordered MPS, which was negative for ischemia.      Vape Use- Patient reports a long time history of smoking. Reports he quit smoking cigarettes after his spine surgery in April but has since started vaping. Patient is not ready to quit at this time.   Plan-  Instructed patient to hold tobacco/nicotine products for at least 8 hours prior to surgery. 1-800-QUIT-NOW hotline provided.      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   Plan- Recommend following up with PCP for potential Pain Management consult to be evaluated for prescribed Suboxone, if appropriate. Message sent to Pain Management team for periop recommendations.             MEDICATION INSTRUCTIONS    Take the following medications the morning of surgery:  Tylenol if needed, inhalers as prescribed, allopurinol, Suboxone    Hold the following medications the day of surgery:  none    Patient instructed to hold NSAIDs (Motrin, Advil, Aleve, Ibuprofen, Naprosyn, Meloxicam, Diclofenac, etc.), vitamins, fish oil, and herbal supplements 7 days prior to the procedure.      DIET INSTRUCTIONS   - Regular diet until 8 hours prior to arrival, clear liquids until 2 hours prior to arrival, unless directed otherwise by your surgeon. No oral tobacco or mints for 8 hours prior to arrival.       Visitation  Policy  Adult: One (1) approved visitor with patient for registration and waiting area prior to procedure (if space allows for physical distancing).     Pediatric and Obstetric: Two (2) visitors / support persons per patient.        Operative Reference Anesthesia  ASA  3    METS  >4    History of difficult intubation No    Personal or family history of anesthesia problems No    OSA (STOP-BANG score) No      Operative Reference Surgery  Allergy to the following: latex, metal/nickel, iodine, acryclic No    Recent Hospitalization (within the last 3 months) 02/18/23 admitted postoperatively for cervical fusion.     Past history of DVT/PE No    Current Tobacco Use  Reports long time smoking history until he quit following 02/2023 spine surgery and started vaping. Vapes with nicotine daily.     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 No SpO2 d/t telemed visit, and no SpO2 on file.      Frailty Score No    Reference:     []  F= Fatigue in the past month? (More unusual fatigue than normal or inability to complete ADLs)  []  R= Resistance- difficulty climbing a flight of stairs?   []  A= Ambulation- difficulty walking one block on level land?  []   I= Illness (HTN, DM, Cancer (other than minor skin CA), Chronic Lung Disease, Heart Attack, CHF, Angina, Asthma, Arthritis, Stroke, CKD); 5+=1 point; <5=0  []   L= Loss of weight- have you lost >5% of your body weight in the past year?       STOP-BANG:        No data to display                       History of Present Illness:  [name] [name] is a 57 y.o. male that presents with need for pre-operative optimization.      Past Medical History:  Past Medical History:   Diagnosis Date    Cervical stenosis of spinal canal     COPD (chronic obstructive pulmonary disease) (CMS HCC)     CVD (cerebrovascular disease)     Esophageal reflux     Gait  disturbance     Gout     History of kidney disease     Hyperlipidemia     Kidney stones     Leg weakness, bilateral     Low back pain     Lumbar radiculopathy     Neck pain     Neck problem     Wears glasses      Past Surgical History:   Procedure Laterality Date  DISC REMOVAL  02/18/2023    C3    HX CERVICAL SPINE SURGERY      HX FOOT SURGERY Left     jont implant left toe    HX HERNIA REPAIR      LITHOTRIPSY       Family Medical History:       Problem Relation (Age of Onset)    COPD Mother              ROS:  Review of Systems   Constitutional: Negative.   Skin:  Positive for open wound (LLE, required sutures, healing well).   HENT: Positive for dental problems (broken right top molar) and visual problem (L eye).   Exercise Tolerance: Neg cardio ROS.Negative for less than 4 METS (can walk up 1-2 flights of stairs without difficutly).   Cardiovascular:  Positive for edema (improved) and orthopnea.   Respiratory:  Positive for shortness of breath (with exertion) (Stable) and wheezing (chronic).    Gastrointestinal: Negative.    Genitourinary: Negative.    Musculoskeletal:  Positive for neck pain and back pain.   Family Anesthesia History: negative anesthesia history ROS.  Anesthesia: Negative.    Neurological:  Positive for numbness (LLE, bilateral hands) and muscle weakness (BLE).   Psychiatric/Behavioral:  Positive for claustrophobia.        Exam:  There were no vitals taken for this visit.      There is no height or weight on file to calculate BMI.    Anesthesia Exam:   Mallampati:   unable to visualize d/t poor telemed connection   Mouth Opening:  good   Neck Mobility: limited   Other HENT Concerns: broken right top molar     Physical Exam  Vitals reviewed: No VS d/t telemed visit.   Constitutional:       Appearance: Normal appearance. He is obese.   HENT:      Head: Normocephalic and atraumatic.      Right Ear: External ear normal.      Left Ear: External ear normal.      Nose: Nose normal.      Mouth/Throat:       Mouth: Mucous membranes are moist.      Pharynx: Oropharynx is clear.   Eyes:      Pupils: Pupils are equal, round, and reactive to light.   Cardiovascular:      Pulses: Normal pulses.   Musculoskeletal:         General: Edema (improved) present.      Cervical back: Normal range of motion and neck supple.   Neurological:      General: No focal deficit present.      Mental Status: He is alert and oriented to person, place, and time. Mental status is at baseline.   Psychiatric:         Mood and Affect: Mood normal.         Behavior: Behavior normal.         Thought Content: Thought content normal.         Judgment: Judgment normal.               Diagnostics included will be reviewed  and followed up as determined necessary prior to procedure             CONSULTS: none            Disclaimer: This note was partially created using voice recognition software and is inherently subject to errors  including those of syntax and "sound alike" substitutions which may escape proof reading. In these instances, original meaning may be extrapolated by the contextual derivation.           Sander Radon, FNP-C  04/29/2023, 07:57    Perioperative Evaluation Center  [facility]  [place]    It should be emphasized that the [facility] Provider consultation is to assess the patient's medical conditions and provide recommendations to optimize the patient to decrease peri-operative complications.  Decision on whether or not to proceed to surgery is made at the care team's discretion.

## 2023-04-29 NOTE — Consults (Signed)
[facility]  Pain Management Consult Service       Date of Service:  04/29/23    Patient: [name] [name]  MRN: [id number]  Date of Birth:  [birth year]      Consulted by [facility] for pain management for upcoming procedure. Pt undergoing lumbar  spinal fusion ( L4-S1) on 7/11 with Dr. Michail Jewels. Per [facility] APP- pt is using Suboxone he is getting off the street (no note of amount).  Recommendations made based upon chart review.    Pre-Op:  no modifications to pre-op plan, would not recommend taking his Suboxone morning of surgery since it is not prescribed and will not be continued inpatient.       Intra-Op: Consider intraoperative methadone 0.2mg /kg IBW and lidocaine infusion 1 mg/min as opioid requirement is likely higher. Consider multimodal regimen including: acetaminophen 975 mg, ketamine up to 50 mg, magnesium 40 mcg/kg, and valium 5 mg (for muscle spasms).       Post-Op:  Recommend dilaudid PCA 0.2 mg q 8 min in addition to oxycodone 5 mg q 4 h PRN moderate pain and 10 mg q 4 h PRN severe pain, recommend 650 mg tylenol scheduled q 6 h while awake.       Inpatient pain management will follow patient once admitted.   Please contact inpatient pain management if further assistance needed.       Darlina Rumpf, APRN,NP-C 04/29/2023, 12:36    Charlann Boxer, MD  04/29/2023, 12:54  Joylene Igo, M.D.   Associate Professor  [facility] Department of Anesthesiology

## 2023-04-30 ENCOUNTER — Encounter (INDEPENDENT_AMBULATORY_CARE_PROVIDER_SITE_OTHER): Payer: Self-pay

## 2023-05-06 ENCOUNTER — Other Ambulatory Visit (HOSPITAL_COMMUNITY): Payer: Self-pay | Admitting: Nurse Practitioner

## 2023-05-06 ENCOUNTER — Inpatient Hospital Stay (HOSPITAL_COMMUNITY)
Admission: RE | Admit: 2023-05-06 | Discharge: 2023-05-06 | Disposition: A | Discharge status: Home / Self Care | Payer: MEDICAID | Source: Ambulatory Visit | Attending: Nurse Practitioner | Admitting: Nurse Practitioner

## 2023-05-06 ENCOUNTER — Other Ambulatory Visit: Payer: MEDICAID | Attending: Nurse Practitioner

## 2023-05-06 ENCOUNTER — Other Ambulatory Visit: Payer: Self-pay

## 2023-05-06 ENCOUNTER — Inpatient Hospital Stay
Admission: RE | Admit: 2023-05-06 | Discharge: 2023-05-06 | Disposition: A | Discharge status: Home / Self Care | Payer: MEDICAID | Source: Ambulatory Visit | Attending: Nurse Practitioner | Admitting: Nurse Practitioner

## 2023-05-06 DIAGNOSIS — Z01818 Encounter for other preprocedural examination: Secondary | ICD-10-CM

## 2023-05-06 HISTORY — PX: LUMBAR DISC SURGERY: SHX700

## 2023-05-06 LAB — CBC WITH DIFF
BASOPHIL #: 0 10*3/uL (ref 0.00–0.20)
BASOPHIL %: 1 % (ref 0–2)
EOSINOPHIL #: 0.5 10*3/uL (ref 0.00–0.60)
EOSINOPHIL %: 9 % — ABNORMAL HIGH (ref 0–5)
HCT: 45.8 % (ref 36.0–46.0)
HGB: 15.9 g/dL (ref 13.9–16.3)
LYMPHOCYTE #: 1.8 10*3/uL (ref 1.10–3.80)
LYMPHOCYTE %: 32 % (ref 19–46)
MCH: 32.9 pg (ref 25.4–34.0)
MCHC: 34.7 g/dL (ref 30.0–37.0)
MCV: 94.7 fL (ref 80.0–100.0)
MONOCYTE #: 0.6 10*3/uL (ref 0.10–0.80)
MONOCYTE %: 10 % (ref 4–12)
MPV: 7.1 fL — ABNORMAL LOW (ref 7.5–11.5)
NEUTROPHIL #: 2.8 10*3/uL (ref 1.80–7.50)
NEUTROPHIL %: 49 % (ref 41–69)
PLATELETS: 259 10*3/uL (ref 130–400)
RBC: 4.84 10*6/uL (ref 4.30–5.90)
RDW: 15.6 % — ABNORMAL HIGH (ref 11.5–14.0)
WBC: 5.8 10*3/uL (ref 4.5–11.5)

## 2023-05-06 LAB — ELECTROLYTES
ANION GAP: 9 mmol/L (ref 5–19)
CHLORIDE: 101 mmol/L (ref 98–107)
CO2 TOTAL: 29 mmol/L (ref 22–30)
POTASSIUM: 4.6 mmol/L (ref 3.5–5.1)
SODIUM: 139 mmol/L (ref 137–145)

## 2023-05-06 LAB — ECG 12 LEAD
Atrial Rate: 83 {beats}/min
Calculated P Axis: 76 degrees
Calculated R Axis: 37 degrees
Calculated T Axis: 40 degrees
PR Interval: 170 ms
QRS Duration: 94 ms
QT Interval: 390 ms
QTC Calculation: 458 ms
Ventricular rate: 83 {beats}/min

## 2023-05-06 LAB — CREATININE WITH EGFR
CREATININE: 0.92 mg/dL (ref 0.66–1.20)
ESTIMATED GFR: >60 mL/min/{1.73_m2} (ref >60)

## 2023-05-06 LAB — TYPE AND SCREEN
ABO/RH(D): O POS
ANTIBODY SCREEN: NEGATIVE

## 2023-05-06 LAB — PT/INR
INR: 1.04 (ref 0.87–1.18)
PROTHROMBIN TIME: 10.7 seconds (ref 9.0–12.0)

## 2023-05-06 LAB — BUN: BUN: 12 mg/dL (ref 9–20)

## 2023-05-06 LAB — PTT (PARTIAL THROMBOPLASTIN TIME): APTT: 30.8 seconds (ref 22.0–32.0)

## 2023-05-15 NOTE — Pharmacy (Signed)
[facility] / Department of Pharmaceutical Services  Therapeutic Drug Monitoring: Vancomycin  05/15/2023      Patient name: [name] [name], [name] [name]  Date of Birth:  1966-02-10    Actual Weight:      128 kg  BMI:   36.29      Date RPh Current regimen (including mg/kg) Indication &  Organism AUC or trough based dosing Target Levels^ SCr (mg/dL) CrCl* (mL/min) Infectious Laboratory Markers (as applicable)   Measured level(s)   (mcg/mL) Calculated AUC (if AUC based monitoring) Plan & predicted AUC/trough if initial dosing (including when levels are due) Comments   7/10 MVS 1500 mg Vanc @15  mg/kg (AdjBW=100.52 kg) Surg prophylaxis     WBC:  Procal:  CRP:   Pre-op 1x dose                                                                                                    ^Target levels depends on dosing and monitoring method, AUC vs. trough based. For AUC based dosing units are mg*h/L. For trough based dosing units are mcg/mL.     *Creatinine clearance is estimated by using the Cockcroft-Gault equation for adult patients and the Brendolyn Patty for pediatric patients.    The decision to discontinue vancomycin therapy will be determined by the primary service.  Please contact the pharmacist with any questions regarding this patient's medication regimen.

## 2023-05-16 ENCOUNTER — Inpatient Hospital Stay (HOSPITAL_COMMUNITY): Payer: MEDICAID

## 2023-05-16 ENCOUNTER — Inpatient Hospital Stay (HOSPITAL_COMMUNITY): Payer: MEDICAID | Admitting: Certified Registered"

## 2023-05-16 ENCOUNTER — Ambulatory Visit (INDEPENDENT_AMBULATORY_CARE_PROVIDER_SITE_OTHER): Payer: Self-pay | Admitting: Family

## 2023-05-16 ENCOUNTER — Encounter (HOSPITAL_COMMUNITY): Payer: Self-pay | Admitting: Neurological Surgery

## 2023-05-16 ENCOUNTER — Inpatient Hospital Stay (HOSPITAL_COMMUNITY): Payer: MEDICAID | Admitting: Neurological Surgery

## 2023-05-16 ENCOUNTER — Inpatient Hospital Stay
Admission: RE | Admit: 2023-05-16 | Discharge: 2023-05-17 | DRG: 455 | Disposition: A | Discharge status: Home / Self Care | Payer: MEDICAID | Source: Ambulatory Visit | Attending: Neurological Surgery | Admitting: Neurological Surgery

## 2023-05-16 ENCOUNTER — Encounter (HOSPITAL_COMMUNITY)
Admission: RE | Disposition: A | Discharge status: Home Health | Payer: Self-pay | Source: Ambulatory Visit | Attending: Neurological Surgery

## 2023-05-16 ENCOUNTER — Other Ambulatory Visit: Payer: Self-pay

## 2023-05-16 DIAGNOSIS — M48061 Spinal stenosis, lumbar region without neurogenic claudication: Principal | ICD-10-CM | POA: Diagnosis present

## 2023-05-16 DIAGNOSIS — M5116 Intervertebral disc disorders with radiculopathy, lumbar region: Secondary | ICD-10-CM | POA: Diagnosis present

## 2023-05-16 DIAGNOSIS — M4317 Spondylolisthesis, lumbosacral region: Secondary | ICD-10-CM | POA: Diagnosis present

## 2023-05-16 DIAGNOSIS — F17290 Nicotine dependence, other tobacco product, uncomplicated: Secondary | ICD-10-CM | POA: Diagnosis present

## 2023-05-16 DIAGNOSIS — M4726 Other spondylosis with radiculopathy, lumbar region: Secondary | ICD-10-CM

## 2023-05-16 DIAGNOSIS — M48062 Spinal stenosis, lumbar region with neurogenic claudication: Secondary | ICD-10-CM

## 2023-05-16 DIAGNOSIS — Z981 Arthrodesis status: Secondary | ICD-10-CM

## 2023-05-16 DIAGNOSIS — K219 Gastro-esophageal reflux disease without esophagitis: Secondary | ICD-10-CM | POA: Diagnosis present

## 2023-05-16 DIAGNOSIS — M109 Gout, unspecified: Secondary | ICD-10-CM | POA: Diagnosis present

## 2023-05-16 DIAGNOSIS — E785 Hyperlipidemia, unspecified: Secondary | ICD-10-CM | POA: Diagnosis present

## 2023-05-16 DIAGNOSIS — M4807 Spinal stenosis, lumbosacral region: Principal | ICD-10-CM | POA: Diagnosis present

## 2023-05-16 DIAGNOSIS — Z7951 Long term (current) use of inhaled steroids: Secondary | ICD-10-CM

## 2023-05-16 DIAGNOSIS — R1310 Dysphagia, unspecified: Secondary | ICD-10-CM | POA: Diagnosis present

## 2023-05-16 DIAGNOSIS — J449 Chronic obstructive pulmonary disease, unspecified: Secondary | ICD-10-CM | POA: Diagnosis present

## 2023-05-16 HISTORY — DX: Presence of spectacles and contact lenses: Z97.3

## 2023-05-16 HISTORY — DX: Personal history of other diseases of urinary system: Z87.448

## 2023-05-16 HISTORY — DX: Gastro-esophageal reflux disease without esophagitis: K21.9

## 2023-05-16 HISTORY — DX: Hyperlipidemia, unspecified: E78.5

## 2023-05-16 HISTORY — DX: Other general symptoms and signs: R68.89

## 2023-05-16 SURGERY — FUSION SPINE POSTERIOR WITH INSTRUMENTATION TLIF AND PLIF
Anesthesia: General | Wound class: Clean Wound: Uninfected operative wounds in which no inflammation occurred

## 2023-05-16 MED ORDER — SUFENTANIL CITRATE 50 MCG/ML INTRAVENOUS SOLUTION
INTRAVENOUS | Status: AC
Start: 2023-05-16 — End: 2023-05-16
  Filled 2023-05-16: qty 1

## 2023-05-16 MED ORDER — LACTATED RINGERS INTRAVENOUS SOLUTION
INTRAVENOUS | Status: DC | PRN
Start: 2023-05-16 — End: 2023-05-16
  Administered 2023-05-16: 0 via INTRAVENOUS

## 2023-05-16 MED ORDER — VANCOMYCIN 10 GRAM INTRAVENOUS SOLUTION
15.0000 mg/kg | Freq: Two times a day (BID) | INTRAVENOUS | Status: DC
Start: 2023-05-17 — End: 2023-05-17
  Administered 2023-05-17: 0 mg via INTRAVENOUS
  Administered 2023-05-17: 1500 mg via INTRAVENOUS
  Administered 2023-05-17: 0 mg via INTRAVENOUS
  Filled 2023-05-16: qty 15

## 2023-05-16 MED ORDER — KETAMINE 10 MG/ML INJECTION WRAPPER
INTRAMUSCULAR | Status: AC
Start: 2023-05-16 — End: 2023-05-16
  Filled 2023-05-16: qty 5

## 2023-05-16 MED ORDER — SURGIFOAM SIZE 100 CM SPONGE
VAGINAL_SPONGE | CUTANEOUS | Status: AC
Start: 2023-05-16 — End: 2023-05-16
  Filled 2023-05-16: qty 1

## 2023-05-16 MED ORDER — SODIUM CHLORIDE 0.9 % (FLUSH) INJECTION SYRINGE
2.0000 mL | INJECTION | Freq: Three times a day (TID) | INTRAMUSCULAR | Status: DC
Start: 2023-05-16 — End: 2023-05-17
  Administered 2023-05-16 – 2023-05-17 (×2): 6 mL

## 2023-05-16 MED ORDER — ONDANSETRON HCL (PF) 4 MG/2 ML INJECTION SOLUTION
Freq: Once | INTRAMUSCULAR | Status: DC | PRN
Start: 2023-05-16 — End: 2023-05-16
  Administered 2023-05-16: 4 mg via INTRAVENOUS

## 2023-05-16 MED ORDER — SUCCINYLCHOLINE 20 MG/ML INTRAVENOUS WRAPPER
INJECTION | Freq: Once | INTRAVENOUS | Status: DC | PRN
Start: 2023-05-16 — End: 2023-05-16
  Administered 2023-05-16: 160 mg via INTRAVENOUS

## 2023-05-16 MED ORDER — DEXAMETHASONE SODIUM PHOSPHATE 4 MG/ML INJECTION SOLUTION
INTRAMUSCULAR | Status: AC
Start: 2023-05-16 — End: 2023-05-16
  Filled 2023-05-16: qty 1

## 2023-05-16 MED ORDER — MAGNESIUM SULFATE 2 GRAM/50 ML (4 %) IN WATER INTRAVENOUS PIGGYBACK
INJECTION | Freq: Once | INTRAVENOUS | Status: DC | PRN
Start: 2023-05-16 — End: 2023-05-16
  Administered 2023-05-16: 2 g via INTRAVENOUS

## 2023-05-16 MED ORDER — SENNOSIDES 8.6 MG-DOCUSATE SODIUM 50 MG TABLET
1.0000 | ORAL_TABLET | Freq: Every day | ORAL | Status: DC
Start: 2023-05-17 — End: 2023-05-17
  Administered 2023-05-17: 1 via ORAL
  Filled 2023-05-16 (×3): qty 1

## 2023-05-16 MED ORDER — ONDANSETRON HCL (PF) 4 MG/2 ML INJECTION SOLUTION
INTRAMUSCULAR | Status: AC
Start: 2023-05-16 — End: 2023-05-16
  Filled 2023-05-16: qty 2

## 2023-05-16 MED ORDER — LACTATED RINGERS INTRAVENOUS SOLUTION
INTRAVENOUS | Status: DC
Start: 2023-05-16 — End: 2023-05-16

## 2023-05-16 MED ORDER — OXYCODONE-ACETAMINOPHEN 5 MG-325 MG TABLET
1.0000 | ORAL_TABLET | ORAL | Status: DC | PRN
Start: 2023-05-16 — End: 2023-05-17

## 2023-05-16 MED ORDER — PROPOFOL 10 MG/ML IV BOLUS
INJECTION | Freq: Once | INTRAVENOUS | Status: DC | PRN
Start: 2023-05-16 — End: 2023-05-16
  Administered 2023-05-16: 200 mg via INTRAVENOUS
  Administered 2023-05-16: 80 mg via INTRAVENOUS

## 2023-05-16 MED ORDER — HYDROMORPHONE (PF) 0.5 MG/0.5 ML INJECTION SYRINGE
INJECTION | Freq: Once | INTRAMUSCULAR | Status: DC | PRN
Start: 2023-05-16 — End: 2023-05-16
  Administered 2023-05-16: .5 mg via INTRAVENOUS

## 2023-05-16 MED ORDER — POLYETHYLENE GLYCOL 3350 17 GRAM ORAL POWDER PACKET
17.0000 g | Freq: Every day | ORAL | Status: DC
Start: 2023-05-17 — End: 2023-05-17
  Administered 2023-05-17: 17 g via ORAL
  Filled 2023-05-16 (×2): qty 1

## 2023-05-16 MED ORDER — PROPOFOL 10 MG/ML INTRAVENOUS EMULSION
INTRAVENOUS | Status: DC | PRN
Start: 2023-05-16 — End: 2023-05-16
  Administered 2023-05-16: 150 ug/kg/min via INTRAVENOUS
  Administered 2023-05-16: 100 ug/kg/min via INTRAVENOUS
  Administered 2023-05-16: 0 ug/kg/min via INTRAVENOUS
  Administered 2023-05-16: 175 ug/kg/min via INTRAVENOUS
  Administered 2023-05-16: 200 ug/kg/min via INTRAVENOUS

## 2023-05-16 MED ORDER — ALLOPURINOL 100 MG TABLET
100.0000 mg | ORAL_TABLET | Freq: Two times a day (BID) | ORAL | Status: DC
Start: 2023-05-16 — End: 2023-05-17
  Administered 2023-05-17 (×2): 100 mg via ORAL
  Filled 2023-05-16 (×3): qty 1

## 2023-05-16 MED ORDER — DEXTROSE 5 % IN WATER (D5W) INTRAVENOUS SOLUTION
2.0000 g | Freq: Three times a day (TID) | INTRAVENOUS | Status: DC
Start: 2023-05-16 — End: 2023-05-16

## 2023-05-16 MED ORDER — DEXAMETHASONE SODIUM PHOSPHATE 4 MG/ML INJECTION SOLUTION
Freq: Once | INTRAMUSCULAR | Status: DC | PRN
Start: 2023-05-16 — End: 2023-05-16
  Administered 2023-05-16: 10 mg via INTRAVENOUS

## 2023-05-16 MED ORDER — HYDROMORPHONE (PF) 0.5 MG/0.5 ML INJECTION SYRINGE
0.4000 mg | INJECTION | INTRAMUSCULAR | Status: DC | PRN
Start: 2023-05-16 — End: 2023-05-17
  Administered 2023-05-17 (×3): 0.4 mg via INTRAVENOUS
  Filled 2023-05-16 (×3): qty 0.5

## 2023-05-16 MED ORDER — LIDOCAINE (PF) 20 MG/ML (2 %) INJECTION SOLUTION
INTRAMUSCULAR | Status: AC
Start: 2023-05-16 — End: 2023-05-16
  Filled 2023-05-16: qty 5

## 2023-05-16 MED ORDER — ACETAMINOPHEN 1,000 MG/100 ML (10 MG/ML) INTRAVENOUS SOLUTION
INTRAVENOUS | Status: AC
Start: 2023-05-16 — End: 2023-05-16
  Filled 2023-05-16: qty 100

## 2023-05-16 MED ORDER — BUPIVACAINE (PF) 0.75 % (7.5 MG/ML) INJECTION SOLUTION
40.0000 mL | Freq: Once | INTRAMUSCULAR | Status: DC | PRN
Start: 2023-05-16 — End: 2023-05-17
  Administered 2023-05-16: 40 mL
  Filled 2023-05-16: qty 20

## 2023-05-16 MED ORDER — HYDROMORPHONE (PF) 0.5 MG/0.5 ML INJECTION SYRINGE
0.4000 mg | INJECTION | INTRAMUSCULAR | Status: AC | PRN
Start: 2023-05-16 — End: 2023-05-16
  Administered 2023-05-16 (×5): 0.4 mg via INTRAVENOUS
  Filled 2023-05-16 (×5): qty 0.5

## 2023-05-16 MED ORDER — SODIUM CHLORIDE 0.9 % (FLUSH) INJECTION SYRINGE
2.0000 mL | INJECTION | INTRAMUSCULAR | Status: DC | PRN
Start: 2023-05-16 — End: 2023-05-17

## 2023-05-16 MED ORDER — TIOTROPIUM BROMIDE 2.5 MCG/ACTUATION MIST FOR INHALATION - RN
2.0000 | Freq: Every day | RESPIRATORY_TRACT | Status: DC
Start: 2023-05-17 — End: 2023-05-17
  Administered 2023-05-17: 2 via RESPIRATORY_TRACT
  Filled 2023-05-16: qty 4

## 2023-05-16 MED ORDER — SUFENTANIL 5 MCG/ML INFUSION - FOR ANES
INTRAVENOUS | Status: DC | PRN
Start: 2023-05-16 — End: 2023-05-16
  Administered 2023-05-16: 0 ug/kg/h via INTRAVENOUS
  Administered 2023-05-16: .15 ug/kg/h via INTRAVENOUS

## 2023-05-16 MED ORDER — SODIUM CHLORIDE 0.9% FLUSH BAG - 250 ML
INTRAVENOUS | Status: DC | PRN
Start: 2023-05-16 — End: 2023-05-17

## 2023-05-16 MED ORDER — LIDOCAINE (PF) 100 MG/5 ML (2 %) INTRAVENOUS SYRINGE
INJECTION | Freq: Once | INTRAVENOUS | Status: DC | PRN
Start: 2023-05-16 — End: 2023-05-16
  Administered 2023-05-16: 100 mg via INTRAVENOUS

## 2023-05-16 MED ORDER — SODIUM CHLORIDE 0.9 % INTRAVENOUS SOLUTION
1500.0000 mg | Freq: Once | INTRAVENOUS | Status: AC
Start: 2023-05-16 — End: 2023-05-16
  Administered 2023-05-16: 1500 mg via INTRAVENOUS
  Filled 2023-05-16: qty 15

## 2023-05-16 MED ORDER — DEXTROSE 5% IN WATER (D5W) FLUSH BAG - 250 ML
INTRAVENOUS | Status: AC | PRN
Start: 2023-05-16 — End: ?

## 2023-05-16 MED ORDER — HYDROMORPHONE (PF) 0.5 MG/0.5 ML INJECTION SYRINGE
INJECTION | INTRAMUSCULAR | Status: AC
Start: 2023-05-16 — End: 2023-05-16
  Filled 2023-05-16: qty 0.5

## 2023-05-16 MED ORDER — DEXTROSE 5 % IN WATER (D5W) INTRAVENOUS SOLUTION
3.0000 g | Freq: Three times a day (TID) | INTRAVENOUS | Status: DC
Start: 2023-05-17 — End: 2023-05-17
  Administered 2023-05-17 (×2): 3 g via INTRAVENOUS
  Administered 2023-05-17 (×2): 0 g via INTRAVENOUS
  Filled 2023-05-16 (×2): qty 15

## 2023-05-16 MED ORDER — SODIUM CHLORIDE 0.9 % INJECTION SOLUTION
Freq: Once | INTRAMUSCULAR | Status: DC | PRN
Start: 2023-05-16 — End: 2023-05-17

## 2023-05-16 MED ORDER — HYDRALAZINE 20 MG/ML INJECTION SOLUTION
10.0000 mg | INTRAMUSCULAR | Status: DC | PRN
Start: 2023-05-16 — End: 2023-05-17

## 2023-05-16 MED ORDER — HYDROMORPHONE (PF) 0.5 MG/0.5 ML INJECTION SYRINGE
0.2000 mg | INJECTION | INTRAMUSCULAR | Status: DC | PRN
Start: 2023-05-16 — End: 2023-05-17

## 2023-05-16 MED ORDER — FENTANYL (PF) 50 MCG/ML INJECTION SOLUTION
INTRAMUSCULAR | Status: AC
Start: 2023-05-16 — End: 2023-05-16
  Filled 2023-05-16: qty 2

## 2023-05-16 MED ORDER — HYDROMORPHONE (PF) 0.5 MG/0.5 ML INJECTION SYRINGE
0.4000 mg | INJECTION | Freq: Once | INTRAMUSCULAR | Status: AC
Start: 2023-05-16 — End: 2023-05-16
  Administered 2023-05-16: 0.4 mg via INTRAVENOUS
  Filled 2023-05-16: qty 0.5

## 2023-05-16 MED ORDER — ACETAMINOPHEN 325 MG TABLET
650.0000 mg | ORAL_TABLET | ORAL | Status: DC | PRN
Start: 2023-05-16 — End: 2023-05-17

## 2023-05-16 MED ORDER — LIDOCAINE 1 %-EPINEPHRINE 1:100,000 INJECTION SOLUTION
15.0000 mL | Freq: Once | INTRAMUSCULAR | Status: DC | PRN
Start: 2023-05-16 — End: 2023-05-17
  Administered 2023-05-16: 10 mL via INTRAMUSCULAR

## 2023-05-16 MED ORDER — SURGIFOAM SIZE 100 CM SPONGE
1.0000 | VAGINAL_SPONGE | Freq: Once | CUTANEOUS | Status: DC | PRN
Start: 2023-05-16 — End: 2023-05-17
  Administered 2023-05-16: 1 via TOPICAL

## 2023-05-16 MED ORDER — SODIUM CHLORIDE 0.9 % INTRAVENOUS SOLUTION
Freq: Once | INTRAVENOUS | Status: DC | PRN
Start: 2023-05-16 — End: 2023-05-17

## 2023-05-16 MED ORDER — CEFAZOLIN 10 GRAM SOLUTION FOR INJECTION
3.0000 g | Freq: Once | INTRAMUSCULAR | Status: AC
Start: 2023-05-16 — End: 2023-05-16
  Administered 2023-05-16: 3 g via INTRAVENOUS
  Filled 2023-05-16: qty 15

## 2023-05-16 MED ORDER — PROPOFOL 10 MG/ML INTRAVENOUS EMULSION
INTRAVENOUS | Status: AC
Start: 2023-05-16 — End: 2023-05-16
  Filled 2023-05-16: qty 50

## 2023-05-16 MED ORDER — PROPOFOL 10 MG/ML INTRAVENOUS EMULSION
INTRAVENOUS | Status: AC
Start: 2023-05-16 — End: 2023-05-16
  Filled 2023-05-16: qty 20

## 2023-05-16 MED ORDER — FENTANYL (PF) 50 MCG/ML INJECTION SOLUTION
Freq: Once | INTRAMUSCULAR | Status: DC | PRN
Start: 2023-05-16 — End: 2023-05-16
  Administered 2023-05-16: 100 ug via INTRAVENOUS

## 2023-05-16 MED ORDER — DEXMEDETOMIDINE 4 MCG/ML IV DILUTION
Freq: Once | INTRAMUSCULAR | Status: DC | PRN
Start: 2023-05-16 — End: 2023-05-16
  Administered 2023-05-16: 20 ug via INTRAVENOUS
  Administered 2023-05-16: 12 ug via INTRAVENOUS

## 2023-05-16 MED ORDER — MIDAZOLAM 1 MG/ML INJECTION SOLUTION
INTRAMUSCULAR | Status: AC
Start: 2023-05-16 — End: 2023-05-16
  Filled 2023-05-16: qty 2

## 2023-05-16 MED ORDER — ALBUTEROL SULFATE CONCENTRATE 2.5 MG/0.5 ML SOLUTION FOR NEBULIZATION
2.5000 mg | INHALATION_SOLUTION | Freq: Three times a day (TID) | RESPIRATORY_TRACT | Status: DC
Start: 2023-05-16 — End: 2023-05-17

## 2023-05-16 MED ORDER — MIDAZOLAM (PF) 1 MG/ML INJECTION SOLUTION
Freq: Once | INTRAMUSCULAR | Status: DC | PRN
Start: 2023-05-16 — End: 2023-05-16
  Administered 2023-05-16: 2 mg via INTRAVENOUS

## 2023-05-16 MED ORDER — CYCLOBENZAPRINE 10 MG TABLET
10.0000 mg | ORAL_TABLET | Freq: Three times a day (TID) | ORAL | Status: DC | PRN
Start: 2023-05-16 — End: 2023-05-17
  Administered 2023-05-17: 10 mg via ORAL
  Filled 2023-05-16: qty 1

## 2023-05-16 MED ORDER — SODIUM CHLORIDE 0.9 % INTRAVENOUS SOLUTION
INTRAVENOUS | Status: DC | PRN
Start: 2023-05-16 — End: 2023-05-16
  Administered 2023-05-16: 0 via INTRAVENOUS

## 2023-05-16 MED ORDER — ROCURONIUM 10 MG/ML INTRAVENOUS SYRINGE WRAPPER
INJECTION | INTRAVENOUS | Status: AC
Start: 2023-05-16 — End: 2023-05-16
  Filled 2023-05-16: qty 5

## 2023-05-16 MED ORDER — GLUCOSAMINE 750 MG-CHONDROITIN-MSM NO1 644 MG-C 30 MG-MANG 1 MG TABLET
2.0000 | ORAL_TABLET | Freq: Every day | ORAL | Status: DC
Start: 2023-05-17 — End: 2023-05-17

## 2023-05-16 MED ORDER — DEXTROSE 5% IN WATER (D5W) FLUSH BAG - 250 ML
INTRAVENOUS | Status: DC | PRN
Start: 2023-05-16 — End: 2023-05-17

## 2023-05-16 MED ORDER — ACETAMINOPHEN 1,000 MG/100 ML (10 MG/ML) INTRAVENOUS SOLUTION
Freq: Once | INTRAVENOUS | Status: DC | PRN
Start: 2023-05-16 — End: 2023-05-16
  Administered 2023-05-16: 1000 mg via INTRAVENOUS

## 2023-05-16 MED ORDER — GELATIN MATRIX SEALANT (FLOSEAL) 10 ML KIT
PACK | Freq: Once | CUTANEOUS | Status: DC | PRN
Start: 2023-05-16 — End: 2023-05-17
  Administered 2023-05-16: 10 mL via TOPICAL

## 2023-05-16 MED ORDER — OXYCODONE-ACETAMINOPHEN 5 MG-325 MG TABLET
2.0000 | ORAL_TABLET | ORAL | Status: DC | PRN
Start: 2023-05-16 — End: 2023-05-17
  Administered 2023-05-16 – 2023-05-17 (×3): 2 via ORAL
  Filled 2023-05-16 (×3): qty 2

## 2023-05-16 MED ORDER — SUCCINYLCHOLINE 20 MG/ML INTRAVENOUS WRAPPER
INJECTION | INTRAVENOUS | Status: AC
Start: 2023-05-16 — End: 2023-05-16
  Filled 2023-05-16: qty 10

## 2023-05-16 MED ORDER — ROCURONIUM 10 MG/ML INTRAVENOUS SYRINGE WRAPPER
INJECTION | Freq: Once | INTRAVENOUS | Status: DC | PRN
Start: 2023-05-16 — End: 2023-05-16
  Administered 2023-05-16: 5 mg via INTRAVENOUS

## 2023-05-16 MED ORDER — VANCOMYCIN 1,000 MG INTRAVENOUS INJECTION
INTRAVENOUS | Status: AC
Start: 2023-05-16 — End: 2023-05-16
  Filled 2023-05-16: qty 10

## 2023-05-16 MED ORDER — KETAMINE 10 MG/ML INJECTION WRAPPER
Freq: Once | INTRAMUSCULAR | Status: DC | PRN
Start: 2023-05-16 — End: 2023-05-16
  Administered 2023-05-16: 50 mg via INTRAVENOUS

## 2023-05-16 SURGICAL SUPPLY — 33 items
BIT DRILL 3.8MM 3MM PREC NEURO STRL LF  DISP (SURGICAL CUTTING SUPPLIES) ×1 IMPLANT
BLANKET MISTRAL-AIR ADULT LWR BODY 55.9X40.2IN FRC AIR HI VOL BLWR INTUITIVE CONTROL PNL LRG LED (MED SURG SUPPLIES) ×1 IMPLANT
BLANKET MISTRAL-AIR ADULT UPR BODY 79X29.9IN FRC AIR HI VOL BLWR INTUITIVE CONTROL PNL LRG LED (MED SURG SUPPLIES) ×1 IMPLANT
BURR SURG 3MM PREC RND (SURGICAL CUTTING SUPPLIES) ×1 IMPLANT
BioSphere MIS II Kit ×1 IMPLANT
CAP 1 LEVEL PLIF SPACERS (IMPLANTS GRAFT/TISSUE) ×1 IMPLANT
CAP LOCK CREO SPINAL THREAD NONST (IMPLANTS SPINE) ×4 IMPLANT
CONV USE ITEM 338656 - PACK SURG NEURO SPINE NONST DISP LF (CUSTOM TRAYS & PACK) ×1 IMPLANT
COVER CAM LEN DISP CLR STRL LF (DRAPE/PACKS/SHEETS/OR TOWEL) ×1 IMPLANT
COVER CASSETTE KOVER LRG XRY 24.5X23.5IN LF (FILM) ×1 IMPLANT
DRAPE CARM FLRSCP EXPD CLPSBL C-ARMOR STRL EQP (DRAPE/PACKS/SHEETS/OR TOWEL) ×1 IMPLANT
DRAPE CARM POLY STRAP MBL XRY 72X42IN LF  EQP (DRAPE/PACKS/SHEETS/OR TOWEL) ×2 IMPLANT
DRAPE FNFLD SHEET 70X40IN MED PRXM LF  STRL DISP SURG SMS (DRAPE/PACKS/SHEETS/OR TOWEL) ×1 IMPLANT
DRAPE SPLT FENESTRATE ABS REINF 108X77IN PRXM LF  STRL DISP SURG SMS 36X29IN BLU (DRAPE/PACKS/SHEETS/OR TOWEL) ×2 IMPLANT
DRESS 6INX3 1/8IN PRMPR ACRL PLSTR HI ABS PAD NWVN SFT BRTHBL CVR WOUND STRL LF (WOUND CARE SUPPLY) ×1 IMPLANT
DRESS 8X4IN PRMPR ACRL PLSTR HI ABS PAD NWVN SFT BRTHBL CVR WOUND STRL LF (WOUND CARE SUPPLY) ×1 IMPLANT
ELECTRODE ESURG BLADE 2.75IN 3/32IN EDGE STRL .2IN DISP INSL STD SHAFT HEX LOCK LF (SURGICAL CUTTING SUPPLIES) ×1 IMPLANT
ELECTRODE ESURG BLADE 6.5IN 3/32IN EDGE STRL .2IN DISP INSL STD SHAFT XTD LF (SURGICAL CUTTING SUPPLIES) ×1 IMPLANT
ELECTRODE ESURG BLADE PNCL 10FT VLAB STRL SS DISP BUTTON SWH HEX LOCK CORD HLSTR LF  ACPT 3/32IN STD (SURGICAL CUTTING SUPPLIES) ×1 IMPLANT
FILLER BONE VOID 10ML SPHR BIOACTIVE GLS PUTTY RSRB STRL LF  DISP ×1 IMPLANT
GOWN SURG LRG L3 NONREINFORCE HKLP CLSR SET IN SLEEVE STRL LF  DISP BLU SIRUS SMS 43IN (DRAPE/PACKS/SHEETS/OR TOWEL) ×2 IMPLANT
HEMOSTAT ABS 8X4IN FLXB SHR WV_SRGCL STRL DISP (WOUND CARE SUPPLY) ×1 IMPLANT
KIT DRAIN 10FR PVC 3 SPRG RESERVOIR TROCAR Y CONN JP 1/8IN RND 400ML STRL LF  DISP (WOUND CARE SUPPLY) ×1 IMPLANT
KIT POSITION JCKSN TBL NONST LF (MED SURG SUPPLIES) ×1 IMPLANT
MILL BONE BIOPSY MED CRSE BLADE 5MM LF  DISP (SURGICAL CUTTING SUPPLIES) ×1 IMPLANT
PACK SURG NEURO SPINE NONST DISP LF (CUSTOM TRAYS & PACK) ×1
ROD SPINAL 40MM 5.5MM CREO CURVE TI MIS NONST LF (IMPLANTS SPINE) ×2 IMPLANT
SCREW BONE CREO CREO A 5-6MM 30MM MODL SPINE CORT NONST LF (IMPLANTS SPINE) ×2 IMPLANT
SCREW BONE CREO CREO A 5-6MM 35MM MODL SPINE CORT NONST LF (IMPLANTS SPINE) ×2 IMPLANT
SCREW BONE CREO CREO A 5.5MM POLYAX TULIP THREAD SPINE NONST LF (IMPLANTS SPINE) ×1 IMPLANT
SPACER SPINAL 30X10X8MM RISE 10D NONST (IMPLANTS SPINE) ×2 IMPLANT
TAP SURG 3.5 MM SCREW (SURGICAL INSTRUMENTS) ×2 IMPLANT
TAP SURG 4 MM SCREW (SURGICAL INSTRUMENTS) ×1 IMPLANT

## 2023-05-16 NOTE — Nurses Notes (Signed)
Upon arrival to PACU pt was yelling expletives and screaming at staff. He was rolling around in bed as well. Anesthesia notified and Dr. Sheral Flow at bedside to assess patient.

## 2023-05-16 NOTE — Progress Notes (Signed)
[facility]  NEUROSURGERY   PROGRESS NOTE      [name], [name], 57 y.o. male  Date of Admission:  05/16/2023  Date of Service: 05/16/2023  Date of Birth:  1965-12-20    Referring Physician:  Unknown, Provider    Post Op Day: Day of Surgery S/P Procedure(s) (LRB):  FUSION SPINE POSTERIOR WITH INSTRUMENTATION TLIF AND PLIF L4-L5 (N/A)    Chief Complaint: Back pain with radiculopathy  Subjective: Recovering from surgery     Vital Signs:  Temp (24hrs) Max:36.4 C (97.5 F)      Systolic (24hrs), Avg:141 , Min:124 , Max:158     Diastolic (24hrs), Avg:96, Min:90, Max:105    Temp  Avg: 36.2 C (97.2 F)  Min: 35.8 C (96.4 F)  Max: 36.4 C (97.5 F)  MAP (Non-Invasive)  Avg: 113 mmHG  Min: 104 mmHG  Max: 122 mmHG  Pulse  Avg: 70.3  Min: 65  Max: 78  Resp  Avg: 16.3  Min: 12  Max: 20  SpO2  Avg: 93 %  Min: 90 %  Max: 97 %       Min/Max/Avg ICP/CPP last 24hrs:   No data recorded    Today's Physical Exam:  NAD  Drowsy  PERRL  Face symmetric  MAEx4  -- Dressing c/d/i, Monocryl/Exofin    Current Medications:  D5W 250 mL flush bag, , Intravenous, Q15 Min PRN  D5W 250 mL flush bag, , Intravenous, Q15 Min PRN  LR premix infusion, , Intravenous, Continuous  NS 250 mL flush bag, , Intravenous, Q15 Min PRN  NS 250 mL flush bag, , Intravenous, Q15 Min PRN  NS flush syringe, 2-6 mL, Intracatheter, Q8HRS  NS flush syringe, 2-6 mL, Intracatheter, Q1 MIN PRN  NS flush syringe, 2-6 mL, Intracatheter, Q8HRS  NS flush syringe, 2-6 mL, Intracatheter, Q1 MIN PRN        I/O:  I/O last 24 hours:    Intake/Output Summary (Last 24 hours) at 05/16/2023 2100  Last data filed at 05/16/2023 2002  Gross per 24 hour   Intake 2165 ml   Output --   Net 2165 ml     I/O current shift:  07/11 1900 - 07/12 0659  In: 1000 [I.V.:1000]  Out: -     Nutrition/Residuals:  No diet orders on file    Labs  Please indicate ordered or reviewed)  Reviewed: Lab Results Today:  No results found for any visits on 05/16/23 (from the past 24 hour(s)).    Assessment/  Plan:  [name] [name] is a 57 y.o. male with severe cervical stenosis s/p C3-C4 ACDF (02/15/23) and L4-L5 spondylolisthesis and disc herniation s/p L4-L5 PLIF.   -- FU upright XR L-spine  -- FU Hemovac   -- Imaging:    XR upright L-spine (05/16/23): ORDERED   -- Pain/spasm control: Tylenol PRN, Percocet PRN, Dilaudid PRN, Flexeril PRN  -- Diet: Regular  -- Bowel regimen, last BM   PTA  -- Abx: Vancomycin and Cefazolin for 24h post-op  -- Activity: ambulate as tolerated w/ assist  -- PT/OT: ordered   -- DVT ppx: SCDs/Venodynes  -- Consults:    none  -- Lines/Drains: Hemovac  -- Wound: Exofin/Monocryl  -- Disposition: Ongoing    Durward Fortes, MD PhD  PGY-4  05/16/2023;21:38

## 2023-05-16 NOTE — Anesthesia Preprocedure Evaluation (Signed)
ANESTHESIA PRE-OP EVALUATION  Planned Procedure: FUSION SPINE POSTERIOR WITH INSTRUMENTATION TLIF AND PLIF  Review of Systems         patient summary reviewed  nursing notes reviewed        Pulmonary   COPD and moderate,   Cardiovascular    Hyperlipidemia ,       GI/Hepatic/Renal    GERD and kidney stones        Endo/Other         Neuro/Psych/MS    Neck problems     Cancer                  Physical Assessment      Airway       Mallampati: II    TM distance: 3 FB    Neck ROM: limited  Mouth Opening: good.  Facial hair  Beard        Dental           (+) poor dentition           Pulmonary      (+) wheezes present        Cardiovascular    Rhythm: regular  Rate: Normal       Other findings  Lower arch notable for periodontal disease        Plan  ASA 3     Planned anesthesia type: general     general anesthesia with endotracheal tube intubation      PONV Plan:  I plan to administer pharmcologic prophalaxis antiemetics              Intravenous induction     Anesthesia issues/risks discussed are: Dental Injuries, Sore Throat, Blood Loss, Aspiration, Post-op Intubation/Ventilation and Eye /Visual Loss.  Anesthetic plan and risks discussed with patient and significant other  signed consent obtained      Use of blood products discussed with patient and significant other who consented to blood products.      Patient's NPO status is appropriate for Anesthesia.           Plan discussed with CRNA.

## 2023-05-16 NOTE — Brief Op Note (Signed)
[facility]                                                     BRIEF OPERATIVE NOTE    Patient Name: [name] [name], [name] [name] Number: [id number]  Date of Service: 05/16/2023   Date of Birth: Mar 07, 1966    All elements must be documented.    Pre-Operative Diagnosis: L4-L5 spondylolisthesis and disc herniation    Post-Operative Diagnosis: same  Procedure(s)/Description:  L4-L5 posterior interbody fusion, Intraoperative fluoroscopy, intraoperative neuromonitoring  Findings/Complexity (inherent to the procedure performed): Good decompression and appropriate hardware placement, cortical screws     Attending Surgeon: Michail Jewels  Assistant(s): Irving Burton    Anesthesia Type: General  Estimated Blood Loss:  175 cc  Blood Given: none  Fluids Given: Crystalloid  Complications (not routinely expected or not inherent to difficulty/nature of procedure): none  Characteristic Event (routinely expected or inherent to the difficulty/nature of the procedure): none  Did the use of current and/or prior Anticoagulants impact the outcome of the case? no  Wound Class: Clean Wound: Uninfected operative wounds in which no inflammation occurred    Tubes: None  Drains: Hemovac  Specimens/ Cultures: none  Implants: [employer]: L4 and L5 bilateral cortical screws, tulip heads x4, scews caps x4, bilateral perc lordotic rods           Disposition: PACU - hemodynamically stable.  Condition: stable    Durward Fortes, MD PhD  PGY-4  05/16/2023;21:44

## 2023-05-16 NOTE — OR PreOp (Signed)
Patient to 5W Pre-Op. Pre-Op procedures complete. Girlfriend at bedside.      Melany Guernsey, RN

## 2023-05-16 NOTE — H&P (Signed)
[facility]  H&P Update Form    [name], [name], 57 y.o. male  Encounter Start Date:  05/16/2023  Inpatient Admission Date: 05/16/2023  Date of Birth:  05-17-1966    05/16/2023    STOP: IF H&P IS GREATER THAN 30 DAYS FROM SURGICAL DAY COMPLETE NEW H&P IS REQUIRED.     H & P updated the day of the procedure.  1.  H&P completed within 30 days of surgical procedure and has been reviewed within 24 hours of admission but prior to surgery or a procedure requiring anesthesia services by Dr. Michail Jewels on 05/16/23 , the patient has been examined, and no change has occured in the patients condition since the H&P was completed.       Change in medications: No                Comments:     2. COVID: N/A    3.  Patient continues to be appropiate candidate for planned surgical procedure. YES    Toni Arthurs, MD, PhD  PGY-1, Neurosurgery      Late entry for 05/16/23. I saw and examined the patient.  I reviewed the resident's note.  I agree with the findings and plan of care as documented in the resident's note.  Any exceptions/additions are edited/noted.    Mariann Laster, MD

## 2023-05-16 NOTE — Anesthesia Transfer of Care (Signed)
ANESTHESIA TRANSFER OF CARE   [name] [name] is a 57 y.o. ,male, Weight: 127 kg (279 lb 1.6 oz)   had Procedure(s) with comments:  FUSION SPINE POSTERIOR WITH INSTRUMENTATION TLIF AND PLIF L4-L5 - L4/5 TLIF  performed  05/16/23   Primary Service: Mariann Laster, MD    Past Medical History:   Diagnosis Date   . Cervical stenosis of spinal canal    . Chronic obstructive pulmonary disease (CMS HCC) 04/29/2023   . COPD (chronic obstructive pulmonary disease) (CMS HCC)    . CVD (cerebrovascular disease)    . Esophageal reflux    . Exertional dyspnea 04/29/2023   . Gait disturbance    . Gout    . History of kidney disease    . History of substance use 04/29/2023   . Hyperlipidemia    . Kidney stones    . Leg weakness, bilateral    . Low back pain    . Lumbar radiculopathy    . Neck pain    . Neck problem    . Wears glasses       Allergy History as of 05/16/23       NAPROXEN         Noted Status Severity Type Reaction    06/11/22 [address], [place], [place] 06/11/22 Active Low  Nausea/ Vomiting                  I completed my transfer of care / handoff to the receiving personnel during which we discussed:  Access, Airway, All key/critical aspects of case discussed, Analgesia, Antibiotics, Expectation of post procedure, Fluids/Product, Gave opportunity for questions and acknowledgement of understanding, Labs and PMHx      Post Location: PACU                        Additional Info:Pt transported to PACU with supplemental oxygen via simple face mask.  Report given to RN; all questions answered.  VSS, airway patent with oral airway in place.                                       Last OR Temp: Temperature: 36.2 C (97.2 F)  ABG:  PH (ARTERIAL)   Date Value Ref Range Status   02/18/2023 7.25 (L) 7.35 - 7.45 Final     PCO2 (ARTERIAL)   Date Value Ref Range Status   02/18/2023 35 35 - 45 mm/Hg Final     PCO2 (VENOUS)   Date Value Ref Range Status   02/15/2023 60 (H) 41 - 51 mm/Hg Final     PO2 (ARTERIAL)   Date Value Ref Range Status    02/18/2023 72 (L) 83 - 108 mm/Hg Final     PO2 (VENOUS)   Date Value Ref Range Status   02/15/2023 51 mm/Hg Final     Comment:     No ranges were established for venous pO2 as manufacturer does not recommend venous sample for this test.     SODIUM   Date Value Ref Range Status   02/18/2023 133 (L) 136 - 145 mmol/L Final     POTASSIUM   Date Value Ref Range Status   05/06/2023 4.6 3.5 - 5.1 mmol/L Final   02/28/2023 4.4 3.5 - 5.1 mmol/L Final     Comment:     Results can be 0.1-0.3 mmol/L lower in plasma  than serum, owing to lack of platelet activation in plasma specimens.     KETONES   Date Value Ref Range Status   02/15/2023 Negative Negative mg/dL Final     WHOLE BLOOD POTASSIUM   Date Value Ref Range Status   02/18/2023 4.3 3.5 - 5.1 mmol/L Final     CHLORIDE   Date Value Ref Range Status   02/18/2023 101 98 - 107 mmol/L Final     CALCIUM   Date Value Ref Range Status   02/20/2023 9.7 8.6 - 10.2 mg/dL Final     Comment:     Gadolinium-containing contrast can interfere with calcium measurement.     CALCIUM OXALATE CRYSTALS   Date Value Ref Range Status   12/05/2022 Moderate (A) None, Slight /hpf Final     Calculated P Axis   Date Value Ref Range Status   05/06/2023 76 degrees Final     Calculated R Axis   Date Value Ref Range Status   05/06/2023 37 degrees Final     Calculated T Axis   Date Value Ref Range Status   05/06/2023 40 degrees Final     IONIZED CALCIUM   Date Value Ref Range Status   02/18/2023 1.20 1.15 - 1.33 mmol/L Final     LACTATE   Date Value Ref Range Status   02/18/2023 0.6 <=1.9 mmol/L Final     HEMOGLOBIN   Date Value Ref Range Status   02/18/2023 16.7 12.0 - 18.0 g/dL Final     OXYHEMOGLOBIN   Date Value Ref Range Status   02/18/2023 97.7 (H) 90.0 - 95.0 % Final     CARBOXYHEMOGLOBIN   Date Value Ref Range Status   02/18/2023 1.3 <=3.0 % Final     MET-HEMOGLOBIN   Date Value Ref Range Status   02/18/2023 0.8 <=1.5 % Final     BASE EXCESS   Date Value Ref Range Status   02/15/2023 4.0 (H) 0.0  - 3.0 mmol/L Final     BASE EXCESS (ARTERIAL)   Date Value Ref Range Status   02/18/2023 5.0 (H) 0.0 - 3.0 mmol/L Final     BENZODIAZEPINES COMMENTS   Date Value Ref Range Status   02/19/2023   Final     Comment:                                        See LDT message                                     Benzodiazepine Comment     BASE DEFICIT   Date Value Ref Range Status   02/18/2023 11.0 (H) 0.0 - 3.0 mmol/L Final     BICARBONATE (ARTERIAL)   Date Value Ref Range Status   02/18/2023 16.2 (L) 21.0 - 28.0 mmol/L Final     BICARBONATE (VENOUS)   Date Value Ref Range Status   02/15/2023 27.7 22.0 - 29.0 mmol/L Final     %FIO2 (VENOUS)   Date Value Ref Range Status   02/15/2023 44.0 % Final     Airway:* No LDAs found *  Blood pressure (!) 124/92, pulse 78, temperature 36.2 C (97.2 F), resp. rate 20, height 1.88 m (6\' 2" ), weight 127 kg (279 lb 1.6 oz), SpO2 92%.

## 2023-05-17 ENCOUNTER — Inpatient Hospital Stay (HOSPITAL_COMMUNITY): Payer: MEDICAID

## 2023-05-17 ENCOUNTER — Other Ambulatory Visit: Payer: Self-pay

## 2023-05-17 DIAGNOSIS — Z981 Arthrodesis status: Secondary | ICD-10-CM

## 2023-05-17 DIAGNOSIS — M48061 Spinal stenosis, lumbar region without neurogenic claudication: Secondary | ICD-10-CM

## 2023-05-17 LAB — BASIC METABOLIC PANEL
ANION GAP: 6 mmol/L (ref 4–13)
BUN/CREA RATIO: 13 (ref 6–22)
BUN: 13 mg/dL (ref 8–25)
CALCIUM: 8.7 mg/dL (ref 8.6–10.2)
CHLORIDE: 103 mmol/L (ref 96–111)
CO2 TOTAL: 25 mmol/L (ref 22–30)
CREATININE: 1.01 mg/dL (ref 0.75–1.35)
ESTIMATED GFR - MALE: 86 mL/min/BSA (ref >=60)
GLUCOSE: 197 mg/dL — ABNORMAL HIGH (ref 65–125)
POTASSIUM: 4.7 mmol/L (ref 3.5–5.1)
SODIUM: 134 mmol/L — ABNORMAL LOW (ref 136–145)

## 2023-05-17 LAB — CBC
HCT: 46.7 % (ref 38.9–52.0)
HGB: 15.4 g/dL (ref 13.4–17.5)
MCH: 32.2 pg — ABNORMAL HIGH (ref 26.0–32.0)
MCHC: 33 g/dL (ref 31.0–35.5)
MCV: 97.5 fL (ref 78.0–100.0)
MPV: 9.6 fL (ref 8.7–12.5)
PLATELETS: 232 10*3/uL (ref 150–400)
RBC: 4.79 10*6/uL (ref 4.50–6.10)
RDW-CV: 14.3 % (ref 11.5–15.5)
WBC: 9.5 10*3/uL (ref 3.7–11.0)

## 2023-05-17 LAB — MAGNESIUM: MAGNESIUM: 1.9 mg/dL (ref 1.8–2.6)

## 2023-05-17 LAB — PHOSPHORUS: PHOSPHORUS: 2.8 mg/dL (ref 2.4–4.7)

## 2023-05-17 MED ORDER — SENNOSIDES 8.6 MG-DOCUSATE SODIUM 50 MG TABLET
1.0000 | ORAL_TABLET | Freq: Every day | ORAL | Status: DC
Start: 2023-05-18 — End: 2023-06-21

## 2023-05-17 MED ORDER — POLYETHYLENE GLYCOL 3350 17 GRAM ORAL POWDER PACKET
17.0000 g | Freq: Every day | ORAL | Status: DC
Start: 2023-05-18 — End: 2023-06-21

## 2023-05-17 MED ORDER — OXYCODONE 5 MG TABLET
5.0000 mg | ORAL_TABLET | ORAL | 0 refills | Status: DC | PRN
Start: 2023-05-17 — End: 2023-06-21
  Filled 2023-05-17: qty 30, 5d supply, fill #0

## 2023-05-17 MED ORDER — OXYCODONE 10 MG TABLET
10.0000 mg | ORAL_TABLET | ORAL | Status: DC | PRN
Start: 2023-05-17 — End: 2023-05-17
  Administered 2023-05-17 (×2): 10 mg via ORAL
  Filled 2023-05-17 (×2): qty 1

## 2023-05-17 MED ORDER — CYCLOBENZAPRINE 10 MG TABLET
10.0000 mg | ORAL_TABLET | Freq: Three times a day (TID) | ORAL | 0 refills | Status: DC | PRN
Start: 2023-05-17 — End: 2023-06-21
  Filled 2023-05-17: qty 90, 30d supply, fill #0

## 2023-05-17 MED ORDER — OXYCODONE 5 MG TABLET
5.0000 mg | ORAL_TABLET | ORAL | Status: DC | PRN
Start: 2023-05-17 — End: 2023-05-17

## 2023-05-17 MED ORDER — ACETAMINOPHEN 325 MG TABLET
650.0000 mg | ORAL_TABLET | Freq: Four times a day (QID) | ORAL | Status: AC
Start: 2023-05-17 — End: ?

## 2023-05-17 MED ORDER — NALOXONE 4 MG/ACTUATION NASAL SPRAY
1.0000 | NASAL | 0 refills | Status: DC | PRN
Start: 2023-05-17 — End: 2023-06-21
  Filled 2023-05-17: qty 2, 1d supply, fill #0

## 2023-05-17 MED ORDER — ACETAMINOPHEN 325 MG TABLET
650.0000 mg | ORAL_TABLET | Freq: Four times a day (QID) | ORAL | Status: DC
Start: 2023-05-17 — End: 2023-05-17
  Administered 2023-05-17: 650 mg via ORAL
  Filled 2023-05-17: qty 2

## 2023-05-17 NOTE — Discharge Instructions (Signed)
SURGICAL DISCHARGE INSTRUCTIONS     Dr. Michail Jewels, Molly Maduro, MD  performed your FUSION SPINE POSTERIOR WITH INSTRUMENTATION TLIF AND PLIF L4-L5 today at the [facility]    [facility]:  Monday through Friday from 6 a.m. - 7 p.m.: [phone]  Between 7 p.m. - 6 a.m., weekends and holidays:  Call [facility] at [phone] or [phone].    PLEASE SEE WRITTEN HANDOUTS AS DISCUSSED BY YOUR NURSE:      SIGNS AND SYMPTOMS OF A WOUND / INCISION INFECTION   Be sure to watch for the following:  Increase in redness or red streaks near or around the wound or incision.  Increase in pain that is intense or severe and cannot be relieved by the pain medication that your doctor has given you.  Increase in swelling that cannot be relieved by elevation of a body part, or by applying ice, if permitted.  Increase in drainage, or if yellow / green in color and smells bad. This could be on a dressing or a cast.  Increase in fever for longer than 24 hours, or an increase that is higher than 101 degrees Fahrenheit (normal body temperature is 98 degrees Fahrenheit). The incision may feel warm to the touch.    **CALL YOUR DOCTOR IF ONE OR MORE OF THESE SIGNS / SYMPTOMS SHOULD OCCUR.    ANESTHESIA INFORMATION   ANESTHESIA -- ADULT PATIENTS:  You have received intravenous sedation / general anesthesia, and you may feel drowsy and light-headed for several hours. You may even experience some forgetfulness of the procedure. DO NOT DRIVE A MOTOR VEHICLE or perform any activity requiring complete alertness or coordination until you feel fully awake in about 24-48 hours. Do not drink alcoholic beverages for at least 24 hours. Do not stay alone, you must have a responsible adult available to be with you. You may also experience a dry mouth or nausea for 24 hours. This is a normal side effect and will disappear as the effects of the medication wear off.    REMEMBER   If you experience any difficulty breathing, chest pain,  bleeding that you feel is excessive, persistent nausea or vomiting or for any other concerns:  Call your physician Dr. Michail Jewels at [phone] or [phone]. You may also ask to have the doctor on call paged. They are available to you 24 hours a day.    SPECIAL INSTRUCTIONS / COMMENTS   See handouts    FOLLOW-UP APPOINTMENTS   Please call patient services at [phone] or [phone] to schedule a date / time of return. They are open Monday - Friday from 7:30 am - 5:00 pm.

## 2023-05-17 NOTE — Care Plan (Signed)
[facility]  Rehabilitation Services  Physical Therapy Initial Evaluation    Patient Name: [name] [name]  Date of Birth: February 04, 1966  Height: Height: 188 cm (6\' 2" )  Weight: Weight: 127 kg (279 lb 1.6 oz)  Room/Bed: OR 5 NORTH PREPOST/NONE  Payor: [employer] / Plan: [employer] / Product Type: Medicaid [facility] /     Assessment:      Pt tolerated PT eval well. He presents with good strength and mobility. Pt amb well with FWW CGA x 1. Pt and sig other educated on stair training technique, but pt declined to attempt this session. Anticipate home dc with use of DME and possible HH PT follow up.    Discharge Needs:    Equipment Recommendation: front wheeled walker      The patient presents with mobility limitations due to impaired balance, impaired strength, and impaired functional activity tolerance that significantly impair/prevent patient's ability to participate in mobility-related activities of daily living (MRADLs) including  ambulation and transfers in order to safely complete, toileting, safely entering/exiting the home, in reasonable time. This functional mobility deficit can be sufficiently resolved with the use of a front wheeled walker  in order to decrease the risk of falls, morbidity, and mortality in performance of these MRADLs.  Patient is able to safely use this assistive device.    Discharge Disposition: home with assist, home with home health    JUSTIFICATION OF DISCHARGE RECOMMENDATION   Based on current diagnosis, functional performance prior to admission, and current functional performance, this patient requires continued PT services in home with assist, home with home health in order to achieve significant functional improvements in these deficit areas: aerobic capacity/endurance, gait, locomotion, and balance, motor function, muscle performance.        Plan:   Current Intervention: balance training, bed mobility training, gait training, patient/family education,  strengthening, transfer training  To provide physical therapy services minimum of 2x/week  for duration of until discharge.    The risks/benefits of therapy have been discussed with the patient/caregiver and he/she is in agreement with the established plan of care.       Subjective & Objective        05/17/23 0853   Therapist Pager   PT Assigned/ Pager # [phone]   Rehab Session   Document Type evaluation   PT Visit Date 05/17/23   Total PT Minutes: 17   Patient Effort good   Symptoms Noted During/After Treatment none   General Information   Patient Profile Reviewed yes   Patient/Family/Caregiver Comments/Observations alert, cooperative   Pertinent History of Current Functional Problem L4-L5 posterior interbody fusion, Intraoperative fluoroscopy, intraoperative neuromonitoring   Medical Lines Telemetry;PIV Line;Peripheral Drain   Respiratory Status nasal cannula   Existing Precautions/Restrictions fall precautions;full code   Mutuality/Individual Preferences   Individualized Care Needs Amb with FWW A x 1   Plan of Care Reviewed With patient;significant other   Living Environment   Lives With alone   Living Environment Comment typically lives alone, but plans to stay with gf initially. 1 flight of stairs to enter, then single story living. gf for assist as needed   Functional Level Prior   Ambulation 0 - independent   Transferring 0 - independent   Toileting 0 - independent   Prior Functional Level Comment typically indep   Pre Treatment Status   Pre Treatment Patient Status Patient standing at bedside   Support Present Pre Treatment  Family present   Communication Pre  Treatment  Nurse   Cognitive Assessment/Interventions   Behavior/Mood Observations cooperative   Vital Signs   O2 Delivery Pre Treatment supplemental O2   O2 Delivery Post Treatment supplemental O2   Vitals Comment 1L. 90%   Pain Assessment   Pretreatment Pain Rating 7/10   Posttreatment Pain Rating 7/10   Pre/Posttreatment Pain Comment back pain  following dilaudid   RLE Assessment   RLE Assessment WFL- Within Functional Limits   LLE Assessment   LLE Assessment WFL- Within Functional Limits   Bed Mobility Assessment/Treatment   Comment pt did not want to attempt laying down. has been sitting EOB all night   Transfer Assessment/Treatment   Sit-Stand Independence contact guard assist   Stand-Sit Independence contact guard assist   Sit-Stand-Sit, Assist Device walker, front wheeled   Transfer Impairments balance impaired;endurance;pain   Gait Assessment/Treatment   Independence  contact guard assist;verbal cues required   Assistive Device  walker, front wheeled   Distance in Feet 100   Impairments  balance impaired;endurance;pain   Comment pt education provided on stair training technique, "up with the good, down with the bad". pt declined to attempt stair training at this time. pt and sig other at bedside verbalized understanding.   Balance   Comment with FWW   Sitting Balance: Static good balance   Sitting, Dynamic (Balance) good balance   Sit-to-Stand Balance fair + balance   Standing Balance: Static fair + balance   Standing Balance: Dynamic fair balance   Therapeutic Exercise/Activity   Comment pt education provided on DME and HH PT vs OP PT after dc.   Post Treatment Status   Post Treatment Patient Status Patient sitting on edge of bed;Call light within reach   Support Present Post Treatment  Family present   Plan of Care Review   Plan Of Care Reviewed With patient;significant other   Basic Mobility Am-PAC/6Clicks Score (APPROVED Staff)   Turning in bed without bedrails 3   Lying on back to sitting on edge of flat bed 3   Moving to and from a bed to a chair 3   Standing up from chair 3   Walk in room 3   Climbing 3-5 steps with railing 3   6 Clicks Raw Score total 18   Standardized (t-scale) score 41.05   Exercise/Activity Level Performed 7- Walked 25 feet or more   Physical Therapy Clinical Impression   Assessment Pt tolerated PT eval well. He presents  with good strength and mobility. Pt amb well with FWW CGA x 1. Pt and sig other educated on stair training technique, but pt declined to attempt this session. Anticipate home dc with use of DME and possible HH PT follow up.   Criteria for Skilled Therapeutic yes;skilled treatment is necessary   Pathology/Pathophysiology Noted musculoskeletal   Impairments Found (describe specific impairments) aerobic capacity/endurance;gait, locomotion, and balance;motor function;muscle performance   Functional Limitations in Following  self-care;home management;community/leisure   Disability: Inability to Perform community/leisure   Rehab Potential good   Therapy Frequency minimum of 2x/week   Predicted Duration of Therapy Intervention (days/wks) until discharge   Anticipated Equipment Needs at Discharge (PT) front wheeled walker   Anticipated Discharge Disposition home with assist;home with home health   Evaluation Complexity Justification   Patient History: Co-morbidity/factors that impact Plan of Care 3 or more that impact Plan of Care   Examination Components 4 or more Exam elements addressed   Presentation Stable: Uncomplicated, straight-forward, problem focused   Clinical Decision Making Low  complexity   Evaluation Complexity Low complexity   Care Plan Goals   PT Rehab Goals Physical Therapy Goal;Physical Therapy Goal 2   Physical Therapy Goal   PT  Goal, Date Established 05/17/23   PT Goal, Time to Achieve by discharge   PT Goal, Activity Type Pt will amb 250 ft with LRAD mod I   Physical Therapy Goal 2   PT Goal, Date Established 05/17/23   PT Goal, Time to Achieve by discharge   PT Goal, Activity Type Pt will negotiate up/down 4 stairs with 1 HR and S   Planned Therapy Interventions, PT Eval   Planned Therapy Interventions (PT) balance training;bed mobility training;gait training;patient/family education;strengthening;transfer training       Therapist:   Daphene Calamity, PT   Pager #: [phone]

## 2023-05-17 NOTE — Pharmacy (Signed)
[facility] / Department of Pharmaceutical Services  Therapeutic Drug Monitoring: Vancomycin  05/17/2023      Patient name: [name] [name], [name] [name]  Date of Birth:  July 26, 1966    Actual Weight:  Weight: 127 kg (279 lb 1.6 oz) (05/16/23 1145)   128 kg  BMI:  BMI (Calculated): 35.91 (05/16/23 1191)47.82      Date RPh Current regimen (including mg/kg) Indication &  Organism AUC or trough based dosing Target Levels^ SCr (mg/dL) CrCl* (mL/min) Infectious Laboratory Markers (as applicable)   Measured level(s)   (mcg/mL) Calculated AUC (if AUC based monitoring) Plan & predicted AUC/trough if initial dosing (including when levels are due) Comments   7/10 MVS 1500 mg Vanc @15  mg/kg (AdjBW=100.52 kg) Surg prophylaxis     WBC:  Procal:  CRP:   Pre-op 1x dose    7/12 SVE 1500mg  Vanc x1 preop Surg ppx AUC  400-600 0.92 123.8  - - Initiating vancomycin 1,500 mg Q12h x2 doses post op  No levels indicated at this time                                                                                     Arville Go levels depends on dosing and monitoring method, AUC vs. trough based. For AUC based dosing units are mg*h/L. For trough based dosing units are mcg/mL.     *Creatinine clearance is estimated by using the Cockcroft-Gault equation for adult patients and the Brendolyn Patty for pediatric patients.    The decision to discontinue vancomycin therapy will be determined by the primary service.  Please contact the pharmacist with any questions regarding this patient's medication regimen.

## 2023-05-17 NOTE — Nurses Notes (Signed)
Assumed pt care.  Pt alert and oriented x4.  Pt sitting up in bed with feet on the floor at all times.  Pt eating and drinking well.  Pain meds given as given.  Sig other at bedside.  Bed low and locked.

## 2023-05-17 NOTE — OR Surgeon (Signed)
PATIENT NAME: [name] [name], CANTY  HOSPITAL NUMBER:  [id number]  DATE OF SERVICE: 05/16/2023  DATE OF BIRTH:  10/31/1966    OPERATIVE REPORT    PREOPERATIVE DIAGNOSES:  1. L4-L5 lumbar spondylosis.  2. Lumbar stenosis with neurogenic claudication.  3. Lumbar disk herniation with lumbar radiculopathy.  4. Low back pain.    POSTOPERATIVE DIAGNOSES:  1. L4-L5 lumbar spondylosis.  2. Lumbar stenosis with neurogenic claudication.  3. Lumbar disk herniation with lumbar radiculopathy.  4. Low back pain.    NAME OF PROCEDURES:  1.  L4-L5 lumbar decompression with L4 inferior laminectomy, L4 bilateral inferior facetectomy, L5 superior facetectomy, skeletonization of the exiting L4 and descending L5 nerve roots.  2. L4-L5 diskectomy and intervertebral arthrodesis using Globus expandable cages for a bilateral transforaminal approach, 10 x 30 mm elevated to 15 mm height.  3. Allograft biosynthetic bone matrix for intervertebral arthrodesis at L4-L5 and onlay fusion at L4-L5.  4. Nonsegmental L4-L5 posterior Cortical Fix pedicle fixation with bilateral 40 mm lordotic titanium rods.  5. Monitoring for lumbar decompression and fusion.    SURGEON:  Mariann Laster, MD, PhD, Janetta Hora.    ASSISTANT:  Durward Fortes, MD.    COMPLICATIONS:  None.    ESTIMATED BLOOD LOSS:  100 mL.    DRAINS:  Medium Hemovac.    INDICATIONS FOR PROCEDURE:  The patient is a very pleasant gentleman who presented to neurosurgery clinic with the typical issues of neurogenic claudication type symptoms with lumbar radiculopathy in the setting of an MRI that demonstrated significant lumbar spondylosis, a left-sided disk herniation and facet hypertrophy causing severe compression of the exiting L4 nerve roots and descending L5 nerve roots.  Based on the failure of medical management and the need to remove the facet joints, he is brought to the operative suite for surgical intervention.    DESCRIPTION OF PROCEDURE:  Prior to procedure a time-out was completed.  His allergies  were reviewed.  He was given weight-based IV antibiotics, 10 mg of IV Decadron, intubated, and placed in prone position on a Trios bed with chest and hip supports, and the area overlying his back was prepped with alcohol and chlorhexidine and draped in sterile fashion.  Approximately 10 mL of anesthetic with epinephrine was injected in midline and a 10 blade scalpel was used to make an incision from the midbody of L3 to L5.  We dissected down with Bovie electrocautery and identified the pedicle entry zones using a Cortical Fix approach at L4-L5.  Once this was identified, we then began the decompressive process by using a precision drill and drilled through the pars interarticularis and the inferior portion of the lamina.  We disarticulated the inferior facet joint and then removed them.  A Kerrison punch was then used to resect the posterior longitudinal ligament and to perform an L5 superior facetectomy.  We then skeletonized the exiting L4 and descending L5 nerve roots for a generous decompression.  The disk space was identified, cauterized, incised bilaterally and using rotating cutters, up-angled curettes, pituitary rongeur, and Kerrison punch we removed the disk in piecemeal fashion and prepared the endplates.  Then, 2.5 mL of allograft were inserted through a transforaminal approach and bilateral Globus transforaminal expandable cages, 10 x 30 mm, were inserted and elevated to 15 mm height.  Once the decompression and cage were placed, we then turned our attention to placing Cortical Fix screws using 6.0 x 5.0 30 mm screws at L4, and 6.0 x 5.0  35 mm screws at  L5 and then attached to the Tulip heads to the pedicle screws and a 40 mm lordotic titanium rod was then affixed and torqued in standard fashion.  Then, 3 mL of allograft biosynthetic bone matrix were placed in an onlay fusion lateral to the rods at L4-L5 completing the arthrodesis.  Then, 40 mL of Exparel mixed with anesthetic without epinephrine was  injected in the paraspinal musculature.  Continuous neuromonitoring was performed throughout the case.  Stable SSEPs and evoked potentials.  A medium Hemovac was passed.  The fascia closed with 0 Vicryl.  The skin closed with 2-0 Vicryl, and a running Monocryl, and Exofin was placed on the skin surface.  The patient was then turned from prone to supine, extubated, and brought to the PACU for continued evaluation and management.  There were no complications to the procedure.        Mariann Laster, MD, PhD, [facility]  Associate Professor  [place] Department of Neurosurgery              DD:  05/17/2023 08:37:54  DT:  05/17/2023 09:19:35 [place]  D#:  [id number]

## 2023-05-17 NOTE — Discharge Summary (Signed)
[facility]  DISCHARGE SUMMARY    PATIENT NAME:  [name] [name], [name] [name]  MRN:  [id number]  DOB:  1966-06-06    ENCOUNTER DATE:  05/16/2023  INPATIENT ADMISSION DATE: 05/16/2023  DISCHARGE DATE:  05/17/2023    ATTENDING PHYSICIAN: Mariann Laster, MD  SERVICE: NEUROSURGERY 2  PRIMARY CARE PHYSICIAN: Douglas Midcap, DO       No lay caregiver identified.    PRIMARY DISCHARGE DIAGNOSIS: Lumbar stenosis  Active Hospital Problems    Diagnosis Date Noted    Principal Problem: Lumbar stenosis s/p L4-L5 interbody fusion [M48.061] 04/29/2023    S/P lumbar fusion [Z98.1] 05/17/2023      Resolved Hospital Problems   No resolved problems to display.     Active Non-Hospital Problems    Diagnosis Date Noted    History of substance use 04/29/2023    Vapes nicotine containing substance 04/29/2023    Peripheral edema 04/29/2023    Exertional dyspnea 04/29/2023    Chronic obstructive pulmonary disease (CMS HCC) 04/29/2023    Lumbar spondylolysis 04/29/2023    Cervical stenosis of spine 02/15/2023           Current Discharge Medication List        START taking these medications.        Details   acetaminophen 325 mg Tablet  Commonly known as: TYLENOL   650 mg, Oral, EVERY 6 HOURS  Refills: 0     cyclobenzaprine 10 mg Tablet  Commonly known as: FLEXERIL   10 mg, Oral, EVERY 8 HOURS PRN  Qty: 90 Tablet  Refills: 0     naloxone 4 mg/actuation Spray, Non-Aerosol  Commonly known as: NARCAN   Administer 1 Spray by INTRANASAL route into alternating nostrils Every 2 minutes as needed for actual or suspected opioid overdose. Call 911 if used.  Qty: 2 Each  Refills: 0     oxyCODONE 5 mg Tablet  Commonly known as: ROXICODONE   5 mg, Oral, EVERY 4 HOURS PRN  Qty: 30 Tablet  Refills: 0     polyethylene glycol 17 gram Powder in Packet  Commonly known as: MIRALAX  Start taking on: May 18, 2023   17 g, Oral, DAILY  Refills: 0     sennosides-docusate sodium 8.6-50 mg Tablet  Commonly known as: SENOKOT-S  Start taking on: May 18, 2023   1 Tablet, Oral,  DAILY  Refills: 0            CONTINUE these medications - NO CHANGES were made during your visit.        Details   albuterol 2.5 mg/0.5 mL Solution for Nebulization  Commonly known as: PROVENTIL   2.5 mg, Nebulization, 3 TIMES DAILY  Refills: 0     allopurinoL 100 mg Tablet  Commonly known as: ZYLOPRIM   100 mg, Oral, 2 TIMES DAILY  Qty: 60 Tablet  Refills: 1     glucosam-chon-msm1-C-mang 750 mg-644 mg- 30 mg-1 mg Tablet  Commonly known as: OSTEO BI-FLEX TRIPLE STRENGTH   2 Tablets, Oral, DAILY  Refills: 0     ONE A DAY MEN COMPLETE ORAL   1 Tablet, Oral, DAILY  Refills: 0     Spiriva Respimat 2.5 mcg/actuation oral inhaler  Generic drug: tiotropium bromide   2 Puffs, Inhalation, DAILY  Qty: 4 g  Refills: 0     SUBOXONE SL   Sublingual, ASKED PATIENT WHAT THE MG WAS ON THIS MEDICATION AND He STATES THAT He DOES NOT HAVE A PRESCRIPTION FOR IT  He BUYS IT OFF THE "[employer]"  Refills: 0     Symbicort 160-4.5 mcg/actuation oral inhaler  Generic drug: budesonide-formoteroL   Take 2 Puffs by inhalation Twice daily  Qty: 10.2 g  Refills: 0     vitamin B complex Tablet   1 Tablet, Oral, DAILY  Refills: 0            STOP taking these medications.      Ibuprofen 200 mg Tablet  Commonly known as: MOTRIN            Discharge med list refreshed?  YES   NEUROSURGERY RISK FACTORS:  Lumbar stenosis s/p L4-L5 posterior interbody fusion    Allergies   Allergen Reactions    Naproxen Nausea/ Vomiting     HOSPITAL PROCEDURE(S):   Orders Placed This Encounter   Procedures    BEDSIDE  MISC PROCEDURE     Surgical/Procedural Cases on this Admission       Case IDs Date Procedure Surgeon Location Status    [id number] 05/16/23 FUSION SPINE POSTERIOR WITH INSTRUMENTATION TLIF AND PLIF L4-L5 Mariann Laster, MD [facility]          REASON FOR HOSPITALIZATION AND HOSPITAL COURSE   BRIEF HPI:      [name] [name] is a 57 y.o. male with severe cervical stenosis s/p C3-C4 ACDF (02/15/23) and L4-L5 spondylolisthesis and disc herniation s/p L4-L5  PLIF.     Per chart H&P  "[name] [name] is a 57 y.o.  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   Last seen in clinic on 03/27/23 and reported that he was a lot better than he was before surgery, but still had residual numbness and trouble making his arms/hands and legs do what he wants. Mild dysphagia. Cervical XR showed superior C3 screw backed out somewhat (reviewed with Michail Jewels who felt this was consistent with subsidence). Discussed that we will continue to follow. He also had a low back pain with radiation into bilateral legs (L>R). MRI Lumbosacral showed  L4-L5 hnp and significant stenosis, with grade 1 lithesis L5-S1.      He is here today to discuss L4-S1 Fusion. Chronic back/bilateral leg pain significantly worse over the past year. Leg pain is more severe than back pain and occurs in a L5 distribution. He endorses neurogenic claudication with standing/walking. Pain is most severe with prolonged standing. Pain is slightly improved with leaning over on a shopping cart. With prolonged standing his legs/feet will go numb. Denies bowel/bladder changes.      Conservative TX: Completed back PT multiple times, most recent was last year. Likes the inversion table, does not help long term. No injections. Takes 800mg  Ibuprofen daily.     PMH significant for COPD, CVD, Gout, Kidney stones  Vapes"    BRIEF HOSPITAL NARRATIVE:   [name] [name] is a 57 yo M admitted with L4-L5 spondylolisthesis and disc herniation and underwent L4-L5 posterior interbody fusion with use of intraoperative fluoroscopy and intraoperative neuromonitoring by Dr. Michail Jewels.  Incision was closed with Exofin and Monocryl and lumbar Hemovac drain was placed.      Hemovac drain was removed on 05/17/23 without complication.      Pain management was consulted preoperatively on 04/29/23 due to hx of Suboxone use.  Post op DC recommendations are to go home with Oxycodone 5mg  q4h PRN and 650 mg  Tylenol q6h.  Follow up in pain clinic in 1  week will be placed.      The patient was resting and recovering well. The remainder of his stay was unremarkable.  The patient was determined safe for discharge on 05/17/23.  Prior to discharge the patient was tolerating a diet, his pain was well controlled on oral pain medications, and he was ambulating well and physical therapy felt he was safe for discharge home with assist and FWW.  Pt was recommended home health therapy services but feels he does not want to have these services at this time.  The patient will follow up with Neurosurgery in 4 weeks for reevaluation.  All questions were answered prior to discharge and the patient agreed to be discharged at this time. The patient was instructed to follow up sooner for new or concerning symptoms.      TRANSITION/POST DISCHARGE CARE/PENDING TESTS/REFERRALS:   --Please follow up with the Neurosurgery clinic in 4 weeks with coordinated XR L-spine.   -- Incision closed with Exofin and Monocryl    --Please follow up with you PCP in 1-2 weeks after discharge from current medical facility for chronic medical updates, medications, and refills.    -- Follow up with pain clinic in 1 week (ORDER PLACED)    CONDITION ON DISCHARGE:  A. Ambulation:  Up w assist as needed  B. Self-care Ability: Up w assist as needed  C. Cognitive Status Alert and Oriented x 3  D. Code status at discharge: Full      LINES/DRAINS/WOUNDS AT DISCHARGE:   Patient Lines/Drains/Airways Status       Active Line / Dialysis Catheter / Dialysis Graft / Drain / Airway / Wound       Name Placement date Placement time Site Days    Peripheral IV Right Wrist 05/16/23  1728  -- less than 1    Wound  Incision Right Neck 02/18/23  0827  -- 88    Wound  Incision Medial Back 05/16/23  1800  -- less than 1                    DISCHARGE DISPOSITION:  Home discharge  DISCHARGE INSTRUCTIONS:  Post-Discharge Follow Up Appointments       Monday Jul 01, 2023    Return Patient Visit  with Clarisa Fling, APRN,FNP-BC at  1:30 PM      Cardiology [facility] [address]  [address], [place]  [address]  [place] [address]  [phone]             XR LUMBAR SPINE AP AND LAT F/U    Please schedule in conjunction with the neurosurgery follow up appt.     Reason for Exam: L4-L5 interbody fusion      Referral to PAIN MANAGEMENT - [facility]   Referral Type: Physician Referral-Office Visits   Number of Visits Requested: 1     DISCHARGE INSTRUCTION - MISC    You may shower, do not immerse incision sites in water such as bath or swimming. If you have a bandage it may be removed 2 days after you are discharged. Leave incision site open to air, keep clean. No lifting greater than 10 pounds for 2 weeks. No driving while taking narcotic pain medications. Call neurosurgery department or go to emergency room for signs of incisional infection ( fevers, chills, incisional opening, drainage, or redness and swelling around incisions), intractable nausea and vomiting, or any other concerns.  DISCHARGE INSTRUCTION - MISC    Please utilize medication as needed to have a soft bowel movement.  It's important this occurs at least every other day.  Medications include but are not limited to: Miralax and/or Senokot.      Please take these daily if you are taking narcotics.     DISCHARGE INSTRUCTION - MISC    Please follow up with you PCP in 1-2 weeks after discharge from current medical facility for chronic medical updates, medications, and refills.     DISCHARGE INSTRUCTION - MISC    You have been prescribed Tylenol (acetaminophen), a muscle relaxer / anti-spasmodic  (Flexeril/ Cyclobenzaprine or Robaxin/ Methocarbamol), and a Narcotic (Norco, Percocet, or Oxycodone) for as needed (PRN) pain control.     Should you still be experiencing severe pain and need around-the-clock-pain control we suggest staggering these three  medications to prevent periods of time when you have no PRN medication available. Around-the-clock pain medication is most commonly needed through post operative day 5.      A sample schedule of around-the-clock medication timing:    - 8 AM: narcotic and muscle relaxer   -10 AM: tylenol   - Noon: narcotic    - 2 PM: tylenol   - 4 PM: narcotic and muscle relaxer   - 6 PM: tylenol   - 8 PM: narcotic    -10 PM: tylenol and muscle relaxer   - Midnight: narcotic   - 2 AM: tylenol    This schedule will allow the tylenol to help prevent increased pain between narcotic doses and will allow around- the- clock- coverage of the muscle -relaxer to help prevent muscle spasms. Pain medication should be taken as needed, not because the next dose is available. Most people can wean themself off of the narcotic pain medication first, usually starting with the middle of the day dose. Narcotic pain medication can be weaned by taking a half dose and / or increasing the amount of time between doses.    Frequent movement (walking the down hall, walking to the mail box, light stretching) and staying well hydrated will help decrease muscle spasms and help prevent constipation. Please utilize PRN bowel regiment (senokot, colace,dulcolax) to facilitate      Should you take more narcotic pain medication than is safe for you decreased heart rate, low blood pressure, shallow breathing, and decreased conative status are possible. Narcan should be administered at this time and seek medical assistance.    Do not drive, operate equipment/ power tools, or participate in activities requiring quick reaction times,balance, or decision making while taking narcotic pain medication.     DISCHARGE INSTRUCTION - MISC    Please follow up at the pain clinic in 1 week to ensure proper pain management s/p lumbar surgery. (ORDER PLACED)     DISCHARGE INSTRUCTION - MISC    Please follow up with the Neurosurgery clinic in 4 weeks with coordinated XR L spine.   --  Incision closed with Exofin and Monocryl     FOLLOW-UP: NEUROSURGERY - PHYSICIAN OFFICE CTR - [place], [place]     Follow-up in: 1 MONTH    Reason for visit: POST-OP VISIT    Coordination with other outpt. appts.? YES (provide details in comment section & place separate orders for those items) XR L spine   Provider: Dr. Michail Jewels      DME - [facility]    Please note - If patient is 300 lbs or greater please order bariatric or  heavy duty items.     Ht 188 cm    Wt 126.6 kg    Current Attending: Mariann Laster, MD    Medical Condition or Diagnosis which is primary reason for equipment: L4-L5 disk herniation, s/p posterior interbody fusion, Intraoperative fluoroscopy    Patient has mobility limits that significantly impairs ability to participate in one or more mobility related ADL's (MRADL's): Yes    Moblity Limitations: Pt unable to fulfill MRADL's & is able to safely use walker which resolves issue    Walker Type: Front Wheeled    Freedom of Choice: I have informed patient of their freedom of choice with respect to DME providers    Estimated Length of Need (in months; 99 mo.= lifetime) [address] PA-C 05/17/2023, 13:54  Department of Neurosurgery- Cranial   Pager [phone], Phone [phone]      I independently of the faculty provider spent a total of (40) minutes in direct/indirect care of this patient including initial evaluation, review of laboratory, radiology, diagnostic studies, review of medical record, patient/ family education, order entry and coordination of care.      Copies sent to Care Team         Relationship Specialty Notifications Start End    Cecilie Kicks, [place] PCP - General FAMILY MEDICINE Admissions 06/12/22     Phone: [phone] Fax: [phone]         [address] [place] [address]            Referring providers can utilize [email or web address] to access their referred [facility] patient's information.

## 2023-05-17 NOTE — Nurses Notes (Signed)
Patient discharged home with family.  AVS reviewed with patient/care giver.  A written copy of the AVS and discharge instructions was given to the patient/care giver.  Questions sufficiently answered as needed.  Patient/care giver encouraged to follow up with PCP as indicated.  In the event of an emergency, patient/care giver instructed to call 911 or go to the nearest emergency room.

## 2023-05-17 NOTE — Care Plan (Signed)
[facility]  Rehabilitation Services  Occupational Therapy Initial Evaluation    Patient Name: [name] [name]  Date of Birth: 12/28/1965  Height: Height: 188 cm (6\' 2" )  Weight: Weight: 127 kg (279 lb 1.6 oz)  Room/Bed: OR 5 NORTH PREPOST/NONE  Payor: [employer]ICA[employer] / Plan: [employer] MEDICAID / Product Type: Medicaid [facility] /     Assessment:   Pt tolerated OT evaluation fair this date. Pt presents with functional deficits in balance, ROM, mobility, and pain decreasing pt's functional IND from reported baseline. Pt compensating with CGA given use of FWW and reports plan to stay with SO following d/c for assistance. Pt expected to progress well as pain control improves and with continued OOB activity. OT recommending home with assist once medically stable.      Discharge Needs:   Equipment Recommendation: front wheeled walker    The patient presents with mobility limitations due to impaired balance, impaired range of motion, impaired strength, and impaired functional activity tolerance that significantly impair/prevent patient's ability to participate in mobility-related activities of daily living (MRADLs) including  ambulation and transfers in order to safely complete, toileting, laundering/household tasks, safely entering/exiting the home, in reasonable time. This functional mobility deficit can be sufficiently resolved with the use of a front wheeled walker in order to decrease the risk of falls, morbidity, and mortality in performance of these MRADLs.  Patient is able to safely use this assistive device.    Discharge Disposition: home with assist    Plan:   Current Intervention: ADL retraining, IADL retraining, balance training, bed mobility training, endurance training, fine motor coordination training, joint mobilization, motor coordination training, ROM (range of motion), strengthening, stretching, therapeutic exercise, transfer training    To provide Occupational therapy services 1x/day, minimum  of 2x/week, until discharge, until goals are met.       The risks/benefits of therapy have been discussed with the patient/caregiver and he/she is in agreement with the established plan of care.       Subjective & Objective        05/17/23 0854   Therapist Pager   OT Assigned/ Pager # Riki Rusk [phone]   Rehab Session   Document Type evaluation   OT Visit Date 05/17/23   Total OT Minutes: 17   Patient Effort good   Symptoms Noted During/After Treatment none   General Information   Patient Profile Reviewed yes   Onset of Illness/Injury or Date of Surgery 05/16/23   Pertinent History of Current Functional Problem 57 y.o. male with severe cervical stenosis s/p C3-C4 ACDF (02/15/23) and L4-L5 spondylolisthesis and disc herniation s/p L4-L5 PLIF   Medical Lines PIV Line;Peripheral Drain;Telemetry   Respiratory Status nasal cannula   Existing Precautions/Restrictions fall precautions;full code   Pre Treatment Status   Pre Treatment Patient Status Patient standing at bedside;Nurse approved session   Support Present Pre Treatment  Family present   Communication Pre Treatment  Nurse   Mutuality/Individual Preferences   Individualized Care Needs OOB with Ax1 FWW   Living Environment   Lives With alone   Living Environment Comment Pt reports normally living alone however plans to stay with SO initially. 1 flight of stairs to enter. SO able to assist.   Functional Level Prior   Ambulation 0 - independent   Transferring 0 - independent   Toileting 0 - independent   Bathing 0 - independent   Dressing 0 - independent   Eating 0 - independent   Self-Care  Usual Activity Tolerance good   Current Activity Tolerance moderate   Equipment Currently Used at Home no   Activity/Exercise/Self-Care Comment Reports having access to FWW   Vital Signs   Pre SpO2 (%) 90   O2 Delivery Pre Treatment supplemental O2   Post SpO2 (%) 90   O2 Delivery Post Treatment supplemental O2   Vitals Comment 1L   Pain Assessment   Additional Documentation Pain Scale:  Numbers Pre/Post-Treatment (Group)   Pain Assessment   Pretreatment Pain Rating 7/10   Posttreatment Pain Rating 7/10   Pain Scale: Numbers Pre/Post-Treatment   Pain Location generalized   Pain Location - back   Coping/Psychosocial   Observed Emotional State calm;cooperative   Verbalized Emotional State acceptance   Family/Support System   Family/Support Persons significant other   Involvement in Care at bedside;attentive to patient;interacting with patient   Coping/Psychosocial Response Interventions   Plan Of Care Reviewed With patient;significant other   Cognitive Assessment/Interventions   Behavior/Mood Observations behavior appropriate to situation, WNL/WFL   Orientation Status oriented x 4   Attention WNL/WFL   Follows Commands WNL   RUE Assessment   RUE Assessment WFL- Within Functional Limits   LUE Assessment   LUE Assessment WFL- Within Functional Limits   Trunk Assessment   Trunk Assessment X-Exceptions   Trunk ROM Limited secondary to pain   Trunk Strength Limited secondary to pain   Mobility Assessment/Training   Mobility Comment Pt ambulated ~143ft using FWW with CGA.   Bed Mobility Assessment/Treatment   Comment Pt declined attempting. Pt reports he has been sitting EOB all night.   Transfer Assessment/Treatment   Sit-Stand Independence contact guard assist   Stand-Sit Independence contact guard assist   Sit-Stand-Sit, Assist Device walker, front wheeled   Transfer Safety Issues balance decreased during turns;step length decreased;weight-shifting ability decreased   Transfer Impairments balance impaired;pain;ROM decreased   Upper Body Dressing Assessment/Training   Position  sitting   DRESSING ASSESSED [place]-pull over   Independence Level  standby assist   Impairments  pain   Toileting Assessment/Training   Independence Level  not tested   Comment Pt declined.   Balance   Sitting Balance: Static good balance   Sitting, Dynamic (Balance) good balance   Sit-to-Stand Balance fair + balance   Standing  Balance: Static fair + balance   Standing Balance: Dynamic fair balance   Systems Impairment Contributing to Balance Disturbance musculoskeletal   Identified Impairments Contributing to Balance Disturbance pain;decreased ROM   Post Treatment Status   Post Treatment Patient Status Patient sitting on edge of bed;Call light within reach   Support Present Post Treatment  Family present   Care Plan Goals   OT Rehab Goals LB Dressing Goal;Toileting Goal;Transfer Training Goal 2   LB Dressing Goal   LB Dressing Goal, Date Established 05/17/23   LB Dressing Goal, Time to Achieve by discharge   LB Dressing Goal, Activity Type all lower body dressing tasks   LB Dressing Goal, Independence Level independent   Toileting Goal   Toileting Goal, Date Established 05/17/23   Toileting Goal, Time to Achieve by discharge   Toileting Goal, Activity Type all toileting tasks   Toileting Goal, Independence Level independent   Transfer Training Goal 2   Transfer Training Goal, Date Established 05/17/23   Transfer Training Goal, Time to Achieve by discharge   Transfer Training Goal, Activity Type bed-to-chair/chair-to-bed;sit-to-stand/stand-to-sit;toilet   Transfer Training Goal, Independence Level modified independence   Transfer Training Goal, Assist Device walker, rolling  Planned Therapy Interventions, OT Eval   Planned Therapy Interventions ADL retraining;IADL retraining;balance training;bed mobility training;endurance training;fine motor coordination training;joint mobilization;motor coordination training;ROM (range of motion);strengthening;stretching;therapeutic exercise;transfer training   Clinical Impression   Functional Level at Time of Session Pt tolerated OT evaluation fair this date. Pt presents with functional deficits in balance, ROM, mobility, and pain decreasing pt's functional IND from reported baseline. Pt compensating with CGA given use of FWW and reports plan to stay with SO following d/c for assistance. Pt expected to  progress well as pain control improves and with continued OOB activity. OT recommending home with assist once medically stable.   Criteria for Skilled Therapeutic Interventions Met (OT) yes   Rehab Potential good   Therapy Frequency 1x/day;minimum of 2x/week   Predicted Duration of Therapy until discharge;until goals are met   Anticipated Equipment Needs at Discharge front wheeled walker   Anticipated Discharge Disposition home with assist   Highest level of Mobility score   Exercise/Activity Level Performed 7- Walked 25 feet or more   Evaluation Complexity Justification   Occupational Profile Review Expanded review   Performance Deficits Strength;Range of motion;Balance;Mobility;Pain;3-5 deficits   Clinical Decision Making Moderate analytic complexity   Evaluation Complexity Moderate       Therapist:   Crista Luria, OT   Pager #: [phone]

## 2023-05-17 NOTE — Progress Notes (Signed)
[facility]  NEUROSURGERY   PROGRESS NOTE      [name] [name], [name] [name], 57 y.o. male  Date of Admission:  05/16/2023  Date of Service: 05/17/2023  Date of Birth:  May 14, 1966    Referring Physician:  Unknown, Provider    Post Op Day: 1 Day Post-Op S/P Procedure(s) (LRB):  FUSION SPINE POSTERIOR WITH INSTRUMENTATION TLIF AND PLIF L4-L5 (N/A)    Chief Complaint: Back pain with radiculopathy  Subjective: Sitting on bedside with family in room    Vital Signs:  Temp (24hrs) Max:37.4 C (99.3 F)      Systolic (24hrs), Avg:133 , Min:108 , Max:158     Diastolic (24hrs), Avg:81, Min:64, Max:105    Temp  Avg: 36.7 C (98 F)  Min: 35.8 C (96.4 F)  Max: 37.4 C (99.3 F)  MAP (Non-Invasive)  Avg: 96.1 mmHG  Min: 82 mmHG  Max: 122 mmHG  Pulse  Avg: 74  Min: 65  Max: 97  Resp  Avg: 16.4  Min: 12  Max: 20  SpO2  Avg: 89.8 %  Min: 82 %  Max: 97 %       Min/Max/Avg ICP/CPP last 24hrs:   No data recorded    Today's Physical Exam:  Appears stated age, NAD  A&Ox3  GCS 4 6 5     Fluent speech  Appropriate fund of knowledge  Appropriate attention span & concentration  Appropriate recent and remote memory    Sclera white  Trachea midline  Regular respirations  Skin well perfused  Mouth symmetric    CN 2 PERRL  CN 3 4 6  EOMI  CN 5 V1-V3 sensation intact  CN 7 Face symmetric  CN 8 Hearing grossly intact  CN 11 Shrug symmetric  CN 12 Tongue midline    Muscle Strength 5/5 BUE and BLE  Muscle tone WNL  SILT BUE and BLE, except for baseline paresthesia  No drift  No hoffman   Plantars downgoing  No clonus  Gait station not assessed     -- Dressing c/d/i, Monocryl/Exofin    Current Medications:  oxyCODONE-acetaminophen (PERCOCET) 5-325mg  per tablet, 1 Tablet, Oral, Q4H PRN   Or  oxyCODONE-acetaminophen (PERCOCET) 5-325mg  per tablet, 2 Tablet, Oral, Q4H PRN   Or  acetaminophen (TYLENOL) tablet, 650 mg, Oral, Q4H PRN  albuterol (PROVENTIL) 2.5mg / 0.5 mL nebulizer solution, 2.5 mg, Nebulization, 3x/day  allopurinol (ZYLOPRIM) tablet, 100 mg,  Oral, 2x/day  ceFAZolin (ANCEF) 3 g in D5W 100 mL IVPB, 3 g, Intravenous, Q8H  cyclobenzaprine (FLEXERIL) tablet, 10 mg, Oral, Q8H PRN  D5W 250 mL flush bag, , Intravenous, Q15 Min PRN  D5W 250 mL flush bag, , Intravenous, Q15 Min PRN  D5W 250 mL flush bag, , Intravenous, Q15 Min PRN  hydrALAZINE (APRESOLINE) injection 10 mg, 10 mg, Intravenous, Q4H PRN  HYDROmorphone (DILAUDID) 0.5 mg/0.5 mL injection, 0.4 mg, Intravenous, Q4H PRN  NS 250 mL flush bag, , Intravenous, Q15 Min PRN  NS 250 mL flush bag, , Intravenous, Q15 Min PRN  NS 250 mL flush bag, , Intravenous, Q15 Min PRN  NS flush syringe, 2-6 mL, Intracatheter, Q8HRS  NS flush syringe, 2-6 mL, Intracatheter, Q1 MIN PRN  NS flush syringe, 2-6 mL, Intracatheter, Q8HRS  NS flush syringe, 2-6 mL, Intracatheter, Q1 MIN PRN  NS flush syringe, 2-6 mL, Intracatheter, Q8HRS  NS flush syringe, 2-6 mL, Intracatheter, Q1 MIN PRN  polyethylene glycol (MIRALAX) oral packet, 17 g, Oral, Daily  sennosides-docusate sodium (SENOKOT-S) 8.6-50mg  per tablet, 1 Tablet,  Oral, Daily  tiotropium bromide (SPIRIVA RESPIMAT) 2.5 mcg per inhalation oral inhaler - "Nursing to administer", 2 Puff, Inhalation, Daily  vancomycin (VANCOCIN) 1,500 mg in NS 500 mL IVPB, 15 mg/kg (Adjusted), Intravenous, Q12H        I/O:  I/O last 24 hours:    Intake/Output Summary (Last 24 hours) at 05/17/2023 0947  Last data filed at 05/17/2023 0600  Gross per 24 hour   Intake 3599.5 ml   Output 2310 ml   Net 1289.5 ml     I/O current shift:  No intake/output data recorded.    Nutrition/Residuals:  DIET REGULAR    Labs  Please indicate ordered or reviewed)  Reviewed: Lab Results Today:    Results for orders placed or performed during the hospital encounter of 05/16/23 (from the past 24 hour(s))   BASIC METABOLIC PANEL   Result Value Ref Range    SODIUM 134 (L) 136 - 145 mmol/L    POTASSIUM 4.7 3.5 - 5.1 mmol/L    CHLORIDE 103 96 - 111 mmol/L    CO2 TOTAL 25 22 - 30 mmol/L    ANION GAP 6 4 - 13 mmol/L    CALCIUM  8.7 8.6 - 10.2 mg/dL    GLUCOSE 578 (H) 65 - 125 mg/dL    BUN 13 8 - 25 mg/dL    CREATININE 4.69 6.29 - 1.35 mg/dL    BUN/CREA RATIO 13 6 - 22    ESTIMATED GFR - MALE 86 >=60 mL/min/BSA   PHOSPHORUS   Result Value Ref Range    PHOSPHORUS 2.8 2.4 - 4.7 mg/dL   MAGNESIUM   Result Value Ref Range    MAGNESIUM 1.9 1.8 - 2.6 mg/dL   CBC   Result Value Ref Range    WBC 9.5 3.7 - 11.0 x10^3/uL    RBC 4.79 4.50 - 6.10 x10^6/uL    HGB 15.4 13.4 - 17.5 g/dL    HCT 52.8 41.3 - 24.4 %    MCV 97.5 78.0 - 100.0 fL    MCH 32.2 (H) 26.0 - 32.0 pg    MCHC 33.0 31.0 - 35.5 g/dL    RDW-CV 01.0 27.2 - 53.6 %    PLATELETS 232 150 - 400 x10^3/uL    MPV 9.6 8.7 - 12.5 fL       Assessment/ Plan:  [name] [name] is a 57 y.o. male with severe cervical stenosis s/p C3-C4 ACDF (02/15/23) and L4-L5 spondylolisthesis and disc herniation s/p L4-L5 PLIF.   -- Possible discharge today  -- Hemovac to be removed to day   -- Imaging:    XR upright L-spine (05/16/23): ORDERED  -- Pain/spasm control: Tylenol PRN, Percocet PRN, Dilaudid PRN, Flexeril PRN  -- Diet: Regular  -- Bowel regimen, last BM   PTA  -- Abx: Vancomycin and Cefazolin for 24h post-op  -- Activity: ambulate as tolerated w/ assist  -- PT/OT: ordered   -- DVT ppx: SCDs/Venodynes  -- Consults:    none  -- Lines/Drains: Hemovac  -- Wound: Exofin/Monocryl  -- Disposition: Ongoing    I independently of the faculty provider spent a total of 20 minutes in direct/indirect care of this patient including initial evaluation, review of laboratory, radiology, diagnostic studies, review of medical record, patient/ family education, order entry and coordination of care.

## 2023-05-17 NOTE — Nurses Notes (Signed)
Back xray completed.

## 2023-05-17 NOTE — Procedures (Signed)
Neurosurgery  05/17/2023  09:48    [name] [name]  [id number]  02-19-1966, 57 y.o., male    Hemovac was removed today at bedside.  No immediate complications. All questions were answered.     Donald Prose, MD 05/17/2023, 09:48.

## 2023-05-17 NOTE — Progress Notes (Signed)
Opioid Prescription - First Prescription  Diagnosis requiring prescription: lumbar stenosis s/p L4-L5 interbody fusion    Medication Dosage/Frequency being prescribed:     -- Oxycodone (Roxicodone immediate release tablet), 1 tablet, oral Q4h PRN for severe, breakthrough pain x 7 days: 30 tablets total    I have reviewed prior medication history in the medical record for this patient.  I am unable to review information contained in the state controlled prescription drug monitoring database.    I have discussed any history of non-pharmacological treatment with the patient. The patient reports prior treatment history.      The patient denies history of substance abuse treatment.      Physical exam findings and/or clinical history warranting use of opioid treatment include:   -- Back pain  -- Limitation in ADLs due to associated pain  -- Failure of non-narcotic pain medications to adequately control pain  -- Acute pain control secondary to recent surgery      My goals for treatment include:   -- Limit acute pain in an attempt to facilitate recovery from surgery  -- Control and reduce pain to an acceptable level  -- Reduce neuro-endocrine stress  -- Minimize impact of pain on recovery activities  -- Prevent chronic pain    I have discussed the risk of opioid addiction with the patient.  I have also discussed the risk of using sedatives and alcohol while taking opioids.    Pt was evaluated by pain pre-operatively and was recommended to be seen within 1 week in [place], [place] for pain clinic follow up and post op pain management.  Pt verbalized agreement to this.      Lorie Apley, PA-C 05/17/2023, 15:08  Department of Neurosurgery- Cranial   Pager [phone], Phone [phone]    I independently of the faculty provider spent a total of (8) minutes in direct/indirect care of this patient including initial evaluation, review of laboratory, radiology, diagnostic studies, review of medical record, patient/ family education, order entry and  coordination of care.

## 2023-05-17 NOTE — Care Management Notes (Signed)
[facility]  Care Management Note    Patient Name: [name] [name]  Date of Birth: 1966/07/21  Sex: male  Date/Time of Admission: 05/16/2023 10:35 AM  Room/Bed: OR 5 NORTH PREPOST/NONE  Payor: [employer] MEDICAID / Plan: HEALTH [employer]AN MEDICAID / Product Type: Medicaid [facility] /    LOS: 1 day   Primary Care Providers:  Cecilie Kicks, DO, DO (General)    Admitting Diagnosis:  Lumbar stenosis [M48.061]    Assessment:      05/17/23 1404   Care Management Plan   Patient choice offered to patient/family yes   Form for patient choice reviewed/signed and on chart yes   Facility or Agency Preferences [facility]     MSW was consulted by service that pt is discharging and needs HH and an FWW.  MSW spoke with pt and he declined HH, but agreed to an FWW.  FOC completed and witnessed by MSW, Clent Ridges.  CM Supervisor placed DME order and service signed.  Task sent to staff assistant to arrange and deliver to bedside.  Will follow.    Discharge Plan:  Home with DME (code 1)  [facility]    The patient will continue to be evaluated for developing discharge needs.     Case Manager: Lavonia Dana, [place]  Phone: [phone]

## 2023-05-20 ENCOUNTER — Ambulatory Visit (INDEPENDENT_AMBULATORY_CARE_PROVIDER_SITE_OTHER): Payer: Self-pay

## 2023-05-20 NOTE — Telephone Encounter (Signed)
Patient notified of appt date and time.    Clotee Schlicker, MA

## 2023-05-21 ENCOUNTER — Encounter (INDEPENDENT_AMBULATORY_CARE_PROVIDER_SITE_OTHER): Payer: Self-pay | Admitting: Family

## 2023-05-22 ENCOUNTER — Telehealth (INDEPENDENT_AMBULATORY_CARE_PROVIDER_SITE_OTHER): Payer: Self-pay | Admitting: Family Medicine

## 2023-05-22 ENCOUNTER — Encounter (INDEPENDENT_AMBULATORY_CARE_PROVIDER_SITE_OTHER): Payer: Self-pay | Admitting: Plastic and Reconstructive Surgery

## 2023-05-22 NOTE — Telephone Encounter (Signed)
Wants Handicapped placard.     05/22/2023 11:04    Valera Castle, MA

## 2023-05-22 NOTE — H&P (Signed)
[facility] [place] [facility]  [address]  [place] [address]       Name: [name] [name] MRN:  [id number]   Date: 04/18/2023 Age: 57 y.o.        This visit note created in whole or part using voice recognition software.  Some inverting misspellings and/or word substitutions may occur despite proofreading.  Such errors, when present should not be misconstrued by the 3rd party to attempt to alter the true facts of the visit.  Alteration, whether verbal or written, intentional, unintentional or implied, may carry penalties under [address] or [employer].  Proper interpretation of any such materials will remain the sole prerogative of the author of the note.        Current Outpatient Medications:     acetaminophen (TYLENOL) 325 mg Oral Tablet, Take 2 Tablets (650 mg total) by mouth Every 6 hours, Disp: , Rfl:     albuterol (PROVENTIL) 2.5 mg/0.5 mL Inhalation Solution for Nebulization, Take 0.5 mL (2.5 mg total) by nebulization Three times a day, Disp: , Rfl:     allopurinoL (ZYLOPRIM) 100 mg Oral Tablet, Take 1 Tablet (100 mg total) by mouth Twice daily for 60 days, Disp: 60 Tablet, Rfl: 1    budesonide-formoterol (SYMBICORT) 160-4.5 mcg/actuation Inhaler, Take 2 Puffs by inhalation Twice daily, Disp: 10.2 g, Rfl: 0    buprenorphine HCl/naloxone HCl (SUBOXONE SL), Place under the tongue ASKED PATIENT WHAT THE MG WAS ON THIS MEDICATION AND He STATES THAT He DOES NOT HAVE A PRESCRIPTION FOR IT He BUYS IT OFF THE "[employer]", Disp: , Rfl:     cyclobenzaprine (FLEXERIL) 10 mg Oral Tablet, Take 1 Tablet (10 mg total) by mouth Every 8 hours as needed for Muscle spasms, Disp: 90 Tablet, Rfl: 0    glucosam-chon-msm1-C-mang (OSTEO BI-FLEX TRIPLE STRENGTH) 750 mg-644 mg- 30 mg-1 mg Oral Tablet, Take 2 Tablets by mouth Once a day, Disp: , Rfl:     multivit,cal,mn/folic/D3/lycop (ONE A DAY MEN COMPLETE ORAL), Take 1 Tablet by mouth Once a day, Disp: , Rfl:     naloxone (NARCAN) 4 mg per spray nasal spray,  Administer 1 Spray by INTRANASAL route into alternating nostrils Every 2 minutes as needed for actual or suspected opioid overdose. Call 911 if used., Disp: 2 Each, Rfl: 0    oxyCODONE (ROXICODONE) 5 mg Oral Tablet, Take 1 Tablet (5 mg total) by mouth Every 4 hours as needed for up to 5 days, Disp: 30 Tablet, Rfl: 0    polyethylene glycol (MIRALAX) 17 gram Oral Powder in Packet, Take 1 Packet (17 g total) by mouth Once a day, Disp: , Rfl:     sennosides-docusate sodium (SENOKOT-S) 8.6-50 mg Oral Tablet, Take 1 Tablet by mouth Once a day, Disp: , Rfl:     tiotropium bromide (SPIRIVA RESPIMAT) 2.5 mcg/actuation Inhalation Mist oral inhaler, Take 2 Inhalations (2 Puffs total) by inhalation Once a day for 30 days, Disp: 4 g, Rfl: 0    vitamin B complex Oral Tablet, Take 1 Tablet by mouth Once a day, Disp: , Rfl:      Past Medical History:   Diagnosis Date    Cervical stenosis of spinal canal     Chronic obstructive pulmonary disease (CMS HCC) 04/29/2023    COPD (chronic obstructive pulmonary disease) (CMS HCC)     CVD (cerebrovascular disease)     Esophageal reflux     Exertional dyspnea 04/29/2023    Gait disturbance  Gout     History of kidney disease     History of substance use 04/29/2023    Hyperlipidemia     Kidney stones     Leg weakness, bilateral     Low back pain     Lumbar radiculopathy     Neck pain     Neck problem     Wears glasses             Past Surgical History:   Procedure Laterality Date    DISC REMOVAL  02/18/2023    C3    DISCECTOMY SPINE CERVICAL ANTERIOR MICRO WITH FUSION AND INSTRUMENTATION 1 LEVEL N/A 02/18/2023    Performed by Mariann Laster, MD at [facility] OR 5 NORTH    FUSION SPINE POSTERIOR WITH INSTRUMENTATION TLIF AND PLIF L4-L5 N/A 05/16/2023    Performed by Mariann Laster, MD at [facility] OR 5 NORTH    HX CERVICAL SPINE SURGERY      HX FOOT SURGERY Left     jont implant left toe    HX HERNIA REPAIR      LITHOTRIPSY          Allergies   Allergen Reactions    Naproxen Nausea/ Vomiting         Subjective:    New patient referral for bilateral hand numbness.  Difficulty using items.  Right is worse than left.  Recent, February 23, 2023, cervical operative intervention with improvement in that regard but persistent carpal tunnel symptomatology.  EMG/NCV available for review      Objective:    General: awake alert and oriented x3, in no acute distress  Head: normocephalic   Eyes: PERRLA, EOMI  Chest: clear to auscultation x4   Cardiac: S1-S2  Abdomen: soft nontender  Skin no bilateral hand or wrist skin lesions noted   Musculoskeletal:  Essentially full hand wrist and finger bilateral range of motion.  No evidence of triggering of any digit.  No deformities noted.  Finkelstein's test negative bilateral   Vascular Adson's test negative bilateral  Upper extremity neuro:  Phalen's test positive at 2 seconds bilateral.  Tinel's test positive bilateral carpal tunnel.  Negative remainder of nerve compression points bilateral upper extremity, x8      Studies reviewed:    Referring physician notes reviewed   Brief review of cervical operation February 23, 2023   EMG/NCV raw data and interpretation reviewed    Assessment:    Right carpal tunnel syndrome   Left carpal tunnel syndrome    Plan:  As a plastic surgery specialist, I am only assessing the problems applicable and will leave medical problems to the attending physician.   Risks benefits and treatment alternatives discussed in detail.  Patient was interested in a combination procedure with surgical release on the right and injection therapy to the left at the same operative intervention.      MDM considerations  Problem moderate  Data review high  Potential morbidity assessment moderate    Scheduled for right carpal tunnel, open release and injection left carpal tunnel, mac and local, PCOR      Ross Marcus, MD  05/22/2023 14:11

## 2023-05-23 NOTE — Telephone Encounter (Signed)
Forms filled out and in your folder to sign.     05/23/2023 13:00  Valera Castle, MA

## 2023-06-11 NOTE — Anesthesia Postprocedure Evaluation (Signed)
Anesthesia Post Op Evaluation    Patient: [name] [name]  Procedure(s) with comments:  FUSION SPINE POSTERIOR WITH INSTRUMENTATION TLIF AND PLIF L4-L5 - L4/5 TLIF    Last Vitals:Temperature: 36.6 C (97.9 F) (05/17/23 1130)  Heart Rate: 88 (05/17/23 1130)  BP (Non-Invasive): (!) 150/83 (05/17/23 1130)  Respiratory Rate: 16 (05/17/23 1130)  SpO2: 90 % (05/17/23 1130)        Patient is sufficiently recovered from the effects of anesthesia to participate in the evaluation and has returned to their pre-procedure level.  Patient location during evaluation: PACU       Patient participation: complete - patient participated  Level of consciousness: responsive to verbal stimuli    Pain management: adequate  Airway patency: patent    Anesthetic complications: no  Cardiovascular status: acceptable  Respiratory status: acceptable  Hydration status: acceptable  Patient post-procedure temperature: Pt Normothermic   PONV Status: Absent

## 2023-06-13 NOTE — Care Management Notes (Signed)
Referral Information  ++++++ Placed Provider #1 ++++++  Case Manager: Greg Ice  Provider Type: DME  Provider Name: [facility]  Address:  [address]  [place], [place] [address]  Contact:    Fax:   Fax:

## 2023-06-19 ENCOUNTER — Other Ambulatory Visit (INDEPENDENT_AMBULATORY_CARE_PROVIDER_SITE_OTHER): Payer: MEDICAID

## 2023-06-19 ENCOUNTER — Ambulatory Visit (HOSPITAL_COMMUNITY): Payer: Self-pay

## 2023-06-19 ENCOUNTER — Ambulatory Visit: Payer: MEDICAID | Attending: Neurological Surgery | Admitting: NURSE PRACTITIONER

## 2023-06-19 ENCOUNTER — Inpatient Hospital Stay (HOSPITAL_BASED_OUTPATIENT_CLINIC_OR_DEPARTMENT_OTHER)
Admission: RE | Admit: 2023-06-19 | Discharge: 2023-06-19 | Disposition: A | Discharge status: Home / Self Care | Payer: MEDICAID | Source: Ambulatory Visit

## 2023-06-19 ENCOUNTER — Encounter (INDEPENDENT_AMBULATORY_CARE_PROVIDER_SITE_OTHER): Payer: Self-pay | Admitting: Neurological Surgery

## 2023-06-19 ENCOUNTER — Other Ambulatory Visit: Payer: Self-pay

## 2023-06-19 VITALS — BP 149/81 | HR 98 | Temp 96.4°F | Ht 74.0 in | Wt 292.3 lb

## 2023-06-19 DIAGNOSIS — M5416 Radiculopathy, lumbar region: Secondary | ICD-10-CM

## 2023-06-19 DIAGNOSIS — M4802 Spinal stenosis, cervical region: Secondary | ICD-10-CM

## 2023-06-19 DIAGNOSIS — M5116 Intervertebral disc disorders with radiculopathy, lumbar region: Secondary | ICD-10-CM

## 2023-06-19 DIAGNOSIS — M4302 Spondylolysis, cervical region: Secondary | ICD-10-CM

## 2023-06-19 DIAGNOSIS — M47816 Spondylosis without myelopathy or radiculopathy, lumbar region: Secondary | ICD-10-CM

## 2023-06-19 DIAGNOSIS — M48061 Spinal stenosis, lumbar region without neurogenic claudication: Secondary | ICD-10-CM

## 2023-06-19 DIAGNOSIS — M5126 Other intervertebral disc displacement, lumbar region: Secondary | ICD-10-CM

## 2023-06-19 DIAGNOSIS — Z981 Arthrodesis status: Secondary | ICD-10-CM

## 2023-06-19 DIAGNOSIS — M4316 Spondylolisthesis, lumbar region: Secondary | ICD-10-CM

## 2023-06-19 NOTE — Progress Notes (Signed)
NEUROSURGERY, PHYSIC[facility] OFFICE CENTER  [address]  [place] [address]  Operated by [facility]  Progress Note    Name: [name] [name] MRN:  [id number]   Date: 06/19/2023 DOB:  06/25/1966 (57 y.o.)           Referring Provider:   No referring provider defined for this encounter.          Subjective:   SPIKE EKIS is a 57 y.o. male returning to clinic for post operative follow up with XR. He underwent L4-L5 posterior interbody fusion with use of intraoperative fluoroscopy and intraoperative neuromonitoring by Dr. Michail Jewels on 05/16/23 for L4-L5 spondylolisthesis and disc herniation. he patient was determined safe for discharge on 05/17/23.   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   Today, he reports pain 5/10. Resolved leg pain since surgery. Continues with low back pain.   Reports stiff neck and hand numbness. Planning R CTR and left CT injection in near future. No dysphagia.     Current Outpatient Medications   Medication Sig    acetaminophen (TYLENOL) 325 mg Oral Tablet Take 2 Tablets (650 mg total) by mouth Every 6 hours    albuterol (PROVENTIL) 2.5 mg/0.5 mL Inhalation Solution for Nebulization Take 0.5 mL (2.5 mg total) by nebulization Three times a day    allopurinoL (ZYLOPRIM) 100 mg Oral Tablet Take 1 Tablet (100 mg total) by mouth Twice daily for 60 days    budesonide-formoterol (SYMBICORT) 160-4.5 mcg/actuation Inhaler Take 2 Puffs by inhalation Twice daily    buprenorphine HCl/naloxone HCl (SUBOXONE SL) Place under the tongue ASKED PATIENT WHAT THE MG WAS ON THIS MEDICATION AND He STATES THAT He DOES NOT HAVE A PRESCRIPTION FOR IT He BUYS IT OFF THE "[employer]"    cyclobenzaprine (FLEXERIL) 10 mg Oral Tablet Take 1 Tablet (10 mg total) by mouth Every 8 hours as needed for Muscle spasms    glucosam-chon-msm1-C-mang (OSTEO BI-FLEX TRIPLE STRENGTH) 750 mg-644 mg- 30 mg-1 mg Oral Tablet Take 2 Tablets by mouth  Once a day    multivit,cal,mn/folic/D3/lycop (ONE A DAY MEN COMPLETE ORAL) Take 1 Tablet by mouth Once a day    naloxone (NARCAN) 4 mg per spray nasal spray Administer 1 Spray by INTRANASAL route into alternating nostrils Every 2 minutes as needed for actual or suspected opioid overdose. Call 911 if used.    polyethylene glycol (MIRALAX) 17 gram Oral Powder in Packet Take 1 Packet (17 g total) by mouth Once a day    sennosides-docusate sodium (SENOKOT-S) 8.6-50 mg Oral Tablet Take 1 Tablet by mouth Once a day    tiotropium bromide (SPIRIVA RESPIMAT) 2.5 mcg/actuation Inhalation Mist oral inhaler Take 2 Inhalations (2 Puffs total) by inhalation Once a day for 30 days    vitamin B complex Oral Tablet Take 1 Tablet by mouth Once a day      Objective:   Vital Signs:  BP (!) 149/81   Pulse 98   Temp (!) 35.8 C (96.4 F)   Ht 1.88 m (6\' 2" )   Wt 133 kg (292 lb 5.3 oz)   BMI 37.53 kg/m       Constitutional  General appearance: In no acute distress   HEENT:  Heent:  healed anterior neck incision   Musculoskeletal  Gait and Station: : Normal  Muscle strength (upper extremities): : Normal  Muscle strength (lower extremities): : Normal  Muscle tone (upper extremities): :  Normal  Muscle tone (lower extremities): : Normal  Sensation: decreased hands   Deep tendon reflexes upper and lower extremities: Normal  Hoffman's reflex: Left: negative Right: negative  Ankle clonus:Left : Not present Right Not present  Musculoskeletal tenderness: positive    Incisions well healed    Neurological  Orientation: Normal  Recent and remote memory: Normal  Attention span and concentration: Normal  Language: Normal  Fund of knowledge: Normal    Data reviewed  06/19/23 XR lumbar and cervical [place] PACS:  Stable cervical subsidence and stable lumbar fusion (Reviewed with Michail Jewels)     Discussions with other providers:   Reviewed chart notes     Assessment:    Assessment/Plan   1. Lumbar stenosis    2. S/P cervical spinal fusion    3. Lumbar  radicular pain    4. HNP (herniated nucleus pulposus), lumbar    5. Cervical stenosis of spine    6. Lumbar spondylosis    7. History of lumbar fusion      Orders Placed This Encounter    XR LUMBAR SPINE AP AND LAT    XR CERVICAL SPINE AP AND LATERAL        Recommendations:  TILLMON ENTRIKIN is a 57 y.o. male returning to clinic for post operative follow up with xray:    -Return to clinic in 6 weeks with XR cervical and lumbar      Terrilyn Saver, APRN,FNP-BC

## 2023-06-20 DIAGNOSIS — M4802 Spinal stenosis, cervical region: Secondary | ICD-10-CM

## 2023-06-20 DIAGNOSIS — Z981 Arthrodesis status: Secondary | ICD-10-CM

## 2023-06-20 DIAGNOSIS — M48061 Spinal stenosis, lumbar region without neurogenic claudication: Secondary | ICD-10-CM

## 2023-06-21 ENCOUNTER — Encounter (HOSPITAL_COMMUNITY): Payer: Self-pay

## 2023-06-21 ENCOUNTER — Other Ambulatory Visit: Payer: Self-pay

## 2023-06-21 ENCOUNTER — Inpatient Hospital Stay
Admission: RE | Admit: 2023-06-21 | Discharge: 2023-06-21 | Disposition: A | Discharge status: Home / Self Care | Payer: MEDICAID | Source: Ambulatory Visit | Attending: Plastic and Reconstructive Surgery | Admitting: Plastic and Reconstructive Surgery

## 2023-06-21 HISTORY — DX: Disorder of gingiva and edentulous alveolar ridge, unspecified: K06.9

## 2023-06-21 HISTORY — DX: Dependence on supplemental oxygen: Z99.81

## 2023-06-21 HISTORY — DX: Personal history of other specified conditions: Z87.898

## 2023-06-21 HISTORY — DX: Other chronic pain: G89.29

## 2023-06-21 NOTE — Anesthesia Preprocedure Evaluation (Addendum)
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    Post-stress ejection fraction was 44%.    2024 TTE  Conclusions:  The left ventricular ejection fraction by visual assessment is estimated to be 55-60%.  Mild left ventricular hypertrophy.  There is mild tricuspid regurgitation.   and hyperlipidemia , Exercise Tolerance: > or = 4 METS        GI/Hepatic/Renal    GERD and kidney stones        Endo/Other    osteoarthritis and obesity,      Neuro/Psych/MS  Chronic opioid use (suboxone chronically)  back abnormality, Neck problems     Cancer                      Physical Assessment      Airway       Mallampati: II    TM distance: 3 FB    Neck ROM: limited  Mouth Opening: good.  Facial hair  Beard        Dental           (+) poor dentition           Pulmonary      (+) wheezes present        Cardiovascular    Rhythm: regular  Rate: Normal       Other findings  Lower arch notable for periodontal disease, teeth were sore after last GA for back surgery and DL but not when CMAC was used          Plan  ASA 3     Planned anesthesia type: MAC           PONV Plan:  I plan to administer pharmcologic prophalaxis antiemetics              Intravenous induction     Anesthesia issues/risks discussed are: Dental Injuries, Sore Throat, Blood Loss, Aspiration, Post-op Intubation/Ventilation, Eye /Visual Loss, Nerve Injuries and PONV.  Anesthetic plan and risks discussed with patient  signed consent obtained          Patient's NPO status is appropriate for Anesthesia.           Plan discussed with CRNA.

## 2023-06-24 ENCOUNTER — Encounter (HOSPITAL_COMMUNITY): Payer: MEDICAID | Admitting: Plastic and Reconstructive Surgery

## 2023-06-24 ENCOUNTER — Ambulatory Visit (HOSPITAL_COMMUNITY): Payer: MEDICAID

## 2023-06-24 ENCOUNTER — Encounter (HOSPITAL_COMMUNITY)
Admission: RE | Disposition: A | Discharge status: Home / Self Care | Payer: Self-pay | Source: Ambulatory Visit | Attending: Plastic and Reconstructive Surgery

## 2023-06-24 ENCOUNTER — Other Ambulatory Visit: Payer: Self-pay

## 2023-06-24 ENCOUNTER — Inpatient Hospital Stay
Admission: RE | Admit: 2023-06-24 | Discharge: 2023-06-24 | Disposition: A | Discharge status: Home / Self Care | Payer: MEDICAID | Source: Ambulatory Visit | Attending: Plastic and Reconstructive Surgery | Admitting: Plastic and Reconstructive Surgery

## 2023-06-24 ENCOUNTER — Encounter (HOSPITAL_COMMUNITY): Payer: Self-pay | Admitting: Plastic and Reconstructive Surgery

## 2023-06-24 DIAGNOSIS — Z87442 Personal history of urinary calculi: Secondary | ICD-10-CM | POA: Insufficient documentation

## 2023-06-24 DIAGNOSIS — M1990 Unspecified osteoarthritis, unspecified site: Secondary | ICD-10-CM | POA: Insufficient documentation

## 2023-06-24 DIAGNOSIS — J449 Chronic obstructive pulmonary disease, unspecified: Secondary | ICD-10-CM | POA: Insufficient documentation

## 2023-06-24 DIAGNOSIS — E669 Obesity, unspecified: Secondary | ICD-10-CM | POA: Insufficient documentation

## 2023-06-24 DIAGNOSIS — Z6836 Body mass index (BMI) 36.0-36.9, adult: Secondary | ICD-10-CM | POA: Insufficient documentation

## 2023-06-24 DIAGNOSIS — M542 Cervicalgia: Secondary | ICD-10-CM | POA: Insufficient documentation

## 2023-06-24 DIAGNOSIS — K219 Gastro-esophageal reflux disease without esophagitis: Secondary | ICD-10-CM | POA: Insufficient documentation

## 2023-06-24 DIAGNOSIS — G5603 Carpal tunnel syndrome, bilateral upper limbs: Secondary | ICD-10-CM

## 2023-06-24 DIAGNOSIS — I071 Rheumatic tricuspid insufficiency: Secondary | ICD-10-CM | POA: Insufficient documentation

## 2023-06-24 DIAGNOSIS — Z9981 Dependence on supplemental oxygen: Secondary | ICD-10-CM | POA: Insufficient documentation

## 2023-06-24 DIAGNOSIS — E785 Hyperlipidemia, unspecified: Secondary | ICD-10-CM | POA: Insufficient documentation

## 2023-06-24 DIAGNOSIS — M5416 Radiculopathy, lumbar region: Secondary | ICD-10-CM | POA: Insufficient documentation

## 2023-06-24 SURGERY — RELEASE CARPAL TUNNEL
Anesthesia: Monitor Anesthesia Care | Site: Wrist | Laterality: Right | Wound class: Clean Wound: Uninfected operative wounds in which no inflammation occurred

## 2023-06-24 MED ORDER — IPRATROPIUM 0.5 MG-ALBUTEROL 3 MG (2.5 MG BASE)/3 ML NEBULIZATION SOLN
3.0000 mL | INHALATION_SOLUTION | Freq: Once | RESPIRATORY_TRACT | Status: DC | PRN
Start: 2023-06-24 — End: 2023-06-24

## 2023-06-24 MED ORDER — OXYCODONE-ACETAMINOPHEN 5 MG-325 MG TABLET
1.0000 | ORAL_TABLET | Freq: Once | ORAL | Status: DC | PRN
Start: 2023-06-24 — End: 2023-06-24

## 2023-06-24 MED ORDER — ACETAMINOPHEN 1,000 MG/100 ML (10 MG/ML) INTRAVENOUS SOLUTION
Freq: Once | INTRAVENOUS | Status: DC | PRN
Start: 2023-06-24 — End: 2023-06-24
  Administered 2023-06-24: 1000 mg via INTRAVENOUS

## 2023-06-24 MED ORDER — ONDANSETRON HCL (PF) 4 MG/2 ML INJECTION SOLUTION
4.0000 mg | Freq: Once | INTRAMUSCULAR | Status: DC | PRN
Start: 2023-06-24 — End: 2023-06-24

## 2023-06-24 MED ORDER — PROPOFOL 10 MG/ML INTRAVENOUS EMULSION
INTRAVENOUS | Status: AC
Start: 2023-06-24 — End: 2023-06-24
  Filled 2023-06-24: qty 20

## 2023-06-24 MED ORDER — IPRATROPIUM 0.5 MG-ALBUTEROL 3 MG (2.5 MG BASE)/3 ML NEBULIZATION SOLN
INHALATION_SOLUTION | RESPIRATORY_TRACT | Status: AC
Start: 2023-06-24 — End: 2023-06-24
  Filled 2023-06-24: qty 3

## 2023-06-24 MED ORDER — MIDAZOLAM 1 MG/ML INJECTION WRAPPER
1.0000 mg | Freq: Once | INTRAMUSCULAR | Status: DC | PRN
Start: 2023-06-24 — End: 2023-06-24

## 2023-06-24 MED ORDER — BUPIVACAINE HCL 0.5 % (5 MG/ML) INJECTION SOLUTION
INTRAMUSCULAR | Status: AC
Start: 2023-06-24 — End: 2023-06-24
  Filled 2023-06-24: qty 50

## 2023-06-24 MED ORDER — FENTANYL (PF) 50 MCG/ML INJECTION SOLUTION
INTRAMUSCULAR | Status: AC
Start: 2023-06-24 — End: 2023-06-24
  Filled 2023-06-24: qty 2

## 2023-06-24 MED ORDER — ALBUTEROL SULFATE 2.5 MG/3 ML (0.083 %) SOLUTION FOR NEBULIZATION
2.5000 mg | INHALATION_SOLUTION | Freq: Once | RESPIRATORY_TRACT | Status: DC | PRN
Start: 2023-06-24 — End: 2023-06-24

## 2023-06-24 MED ORDER — HYDROMORPHONE (PF) 0.5 MG/0.5 ML INJECTION SYRINGE
0.2000 mg | INJECTION | INTRAMUSCULAR | Status: DC | PRN
Start: 2023-06-24 — End: 2023-06-24

## 2023-06-24 MED ORDER — HYDROMORPHONE (PF) 0.5 MG/0.5 ML INJECTION SYRINGE
0.5000 mg | INJECTION | INTRAMUSCULAR | Status: DC | PRN
Start: 2023-06-24 — End: 2023-06-24

## 2023-06-24 MED ORDER — LIDOCAINE (PF) 100 MG/5 ML (2 %) INTRAVENOUS SYRINGE
INJECTION | Freq: Once | INTRAVENOUS | Status: DC | PRN
Start: 2023-06-24 — End: 2023-06-24
  Administered 2023-06-24: 60 mg via INTRAVENOUS

## 2023-06-24 MED ORDER — LIDOCAINE-EPINEPHRINE 0.5 %-1:200,000 INJECTION SOLUTION
INTRAMUSCULAR | Status: AC
Start: 2023-06-24 — End: 2023-06-24
  Filled 2023-06-24: qty 50

## 2023-06-24 MED ORDER — SODIUM CHLORIDE 0.9 % (FLUSH) INJECTION SYRINGE
10.0000 mL | INJECTION | INTRAMUSCULAR | Status: DC | PRN
Start: 2023-06-24 — End: 2023-06-24

## 2023-06-24 MED ORDER — MIDAZOLAM 1 MG/ML INJECTION WRAPPER
Freq: Once | INTRAMUSCULAR | Status: DC | PRN
Start: 2023-06-24 — End: 2023-06-24
  Administered 2023-06-24: 2 mg via INTRAVENOUS

## 2023-06-24 MED ORDER — LACTATED RINGERS INTRAVENOUS SOLUTION
INTRAVENOUS | Status: DC
Start: 2023-06-24 — End: 2023-06-24
  Administered 2023-06-24: 100 mL via INTRAVENOUS
  Administered 2023-06-24: 0 via INTRAVENOUS

## 2023-06-24 MED ORDER — FENTANYL (PF) 50 MCG/ML INJECTION SOLUTION
Freq: Once | INTRAMUSCULAR | Status: DC | PRN
Start: 2023-06-24 — End: 2023-06-24
  Administered 2023-06-24 (×2): 50 ug via INTRAVENOUS

## 2023-06-24 MED ORDER — SODIUM CHLORIDE 0.9 % (FLUSH) INJECTION SYRINGE
10.0000 mL | INJECTION | Freq: Three times a day (TID) | INTRAMUSCULAR | Status: DC
Start: 2023-06-24 — End: 2023-06-24

## 2023-06-24 MED ORDER — PROPOFOL 10 MG/ML IV BOLUS
INJECTION | Freq: Once | INTRAVENOUS | Status: DC | PRN
Start: 2023-06-24 — End: 2023-06-24
  Administered 2023-06-24 (×2): 20 mg via INTRAVENOUS

## 2023-06-24 MED ORDER — PROPOFOL 10 MG/ML INTRAVENOUS EMULSION
INTRAVENOUS | Status: DC | PRN
Start: 2023-06-24 — End: 2023-06-24
  Administered 2023-06-24: 0 ug/kg/min via INTRAVENOUS
  Administered 2023-06-24 (×2): 100 ug/kg/min via INTRAVENOUS
  Administered 2023-06-24: 150 ug/kg/min via INTRAVENOUS
  Administered 2023-06-24: 50 ug/kg/min via INTRAVENOUS

## 2023-06-24 MED ORDER — LIDOCAINE HCL 10 MG/ML (1 %) INJECTION SOLUTION
INTRAMUSCULAR | Status: AC
Start: 2023-06-24 — End: 2023-06-24
  Filled 2023-06-24: qty 20

## 2023-06-24 MED ORDER — BUPIVACAINE (PF) 0.5 % (5 MG/ML) INJECTION SOLUTION
Freq: Once | INTRAMUSCULAR | Status: DC | PRN
Start: 2023-06-24 — End: 2023-06-24
  Administered 2023-06-24: .5 mL

## 2023-06-24 MED ORDER — IPRATROPIUM 0.5 MG-ALBUTEROL 3 MG (2.5 MG BASE)/3 ML NEBULIZATION SOLN
3.0000 mL | INHALATION_SOLUTION | Freq: Once | RESPIRATORY_TRACT | Status: AC
Start: 2023-06-24 — End: 2023-06-24
  Administered 2023-06-24: 3 mL via RESPIRATORY_TRACT

## 2023-06-24 MED ORDER — LIDOCAINE-PRILOCAINE 2.5 %-2.5 % TOPICAL CREAM
TOPICAL_CREAM | Freq: Once | CUTANEOUS | Status: DC | PRN
Start: 2023-06-24 — End: 2023-06-24

## 2023-06-24 MED ORDER — LIDOCAINE (PF) 20 MG/ML (2 %) INJECTION SOLUTION
INTRAMUSCULAR | Status: AC
Start: 2023-06-24 — End: 2023-06-24
  Filled 2023-06-24: qty 5

## 2023-06-24 MED ORDER — MIDAZOLAM 1 MG/ML INJECTION WRAPPER
INTRAMUSCULAR | Status: AC
Start: 2023-06-24 — End: 2023-06-24
  Filled 2023-06-24: qty 2

## 2023-06-24 MED ORDER — TRIAMCINOLONE ACETONIDE 40 MG/ML SUSPENSION FOR INJECTION
Freq: Once | INTRAMUSCULAR | Status: DC | PRN
Start: 2023-06-24 — End: 2023-06-24
  Administered 2023-06-24: 10 mg via INTRAMUSCULAR

## 2023-06-24 MED ORDER — ACETAMINOPHEN 1,000 MG/100 ML (10 MG/ML) INTRAVENOUS SOLUTION
INTRAVENOUS | Status: AC
Start: 2023-06-24 — End: 2023-06-24
  Filled 2023-06-24: qty 100

## 2023-06-24 MED ORDER — DROPERIDOL 2.5 MG/ML INJECTION SOLUTION
0.6250 mg | Freq: Once | INTRAMUSCULAR | Status: DC | PRN
Start: 2023-06-24 — End: 2023-06-24

## 2023-06-24 MED ORDER — LIDOCAINE HCL 10 MG/ML (1 %) INJECTION SOLUTION
Freq: Once | INTRAMUSCULAR | Status: DC | PRN
Start: 2023-06-24 — End: 2023-06-24
  Administered 2023-06-24: .5 mL via INTRADERMAL

## 2023-06-24 MED ORDER — DEXMEDETOMIDINE 4 MCG/ML IV DILUTION
Freq: Once | INTRAMUSCULAR | Status: DC | PRN
Start: 2023-06-24 — End: 2023-06-24
  Administered 2023-06-24: 20 ug via INTRAVENOUS

## 2023-06-24 MED ORDER — LIDOCAINE-EPINEPHRINE 0.5 %-1:200,000 INJECTION SOLUTION
Freq: Once | INTRAMUSCULAR | Status: DC | PRN
Start: 2023-06-24 — End: 2023-06-24
  Administered 2023-06-24: 6 mL via INTRADERMAL

## 2023-06-24 MED ORDER — TRIAMCINOLONE ACETONIDE 40 MG/ML SUSPENSION FOR INJECTION
INTRAMUSCULAR | Status: AC
Start: 2023-06-24 — End: 2023-06-24
  Filled 2023-06-24: qty 1

## 2023-06-24 SURGICAL SUPPLY — 15 items
APPL 70% ISPRP 2% CHG 26ML CHLRPRP HI-LT ORNG PREP STRL LF  DISP CLR (MED SURG SUPPLIES) ×2 IMPLANT
BANDAGE 3X.75IN SHR NADH PAD PLASTIC ADH STRL LF (WOUND CARE SUPPLY) ×2 IMPLANT
BANDAGE 4.1YDX4.5IN 6 PLY HYPOALL COTTON LRG GAUZE WHT STRL LF  DISP (WOUND CARE SUPPLY) ×2 IMPLANT
BANDAGE 4.5YDX4IN NONST ELAS W_OVEN CLIP CLSR YARN STD (WOUND CARE SUPPLY) ×2 IMPLANT
CONV USE 48093 - SYRINGE 2OZ LF  STRL EAR PVC DISP GRN ULCR (MED SURG SUPPLIES) ×2 IMPLANT
DRESS PETRO 8X1IN CURAD XR COTTON GAUZE NADH OCL IMPREGNATE LF  STRL WHT (WOUND CARE SUPPLY) ×2 IMPLANT
ELECTRODE PATIENT RTN VLAB RM PHSV ACRL FOAM CRDLS NONIRRITATE NONSENSITIZE ADH STRP ADULT RECQM (SURGICAL CUTTING SUPPLIES) ×2 IMPLANT
NEEDLE HYPO  25GA 1.5IN REG WL PRCSNGL SS POLYPROP REG BVL LL HUB DEHP-FR BLU STRL LF  DISP (MED SURG SUPPLIES) ×4 IMPLANT
PAD PREP MED 2 3/8INX1 1/8IN ALC 2 PLY NWVN ABS STRL LF (MED SURG SUPPLIES) ×2 IMPLANT
SOL IRRG 0.9% NACL 1000ML PLASTIC PR BTL ISTNC N-PYRG STRL LF (MEDICATIONS/SOLUTIONS) ×2 IMPLANT
STRAP POSITION 60X4IN RING KNEE PLASTIC FOAM ADJ HKLP CLSR LF  DISP (MED SURG SUPPLIES) ×2 IMPLANT
SUTURE 5-0 P-3 ETHILON 18IN BLK MONOF NONAB (SUTURE/WOUND CLOSURE) ×2 IMPLANT
SYRINGE 2OZ LF  STRL EAR PVC DISP GRN ULCR (MED SURG SUPPLIES) ×2
SYRINGE SFGLD .5IN 27GA 1ML LF  STRL REG BVL PERM ATTACH NEEDLE SHIELD MECH TB POLYPROP DISP GRY (MED SURG SUPPLIES) ×2 IMPLANT
TRAY PLASTIC BASIC CUSTOM - ~~LOC~~ HOSP (CUSTOM TRAYS & PACK) ×2 IMPLANT

## 2023-06-24 NOTE — OR Surgeon (Signed)
[facility]  OPERATIVE REPORT      [name] [name], [name] [name], 57 y.o. male  Date of Procedure:  06/24/23  Date of Birth:  1966-09-21  Service: Plastic Surgery  PCP:  Cecilie Kicks, DO    Procedure Performed:    1. Open carpal tunnel release right wrist  2. Injection water-soluble corticosteroid left wrist, carpal tunnel    Pre-Operative Diagnosis: Pre-Op Diagnosis Codes:      * Carpal tunnel syndrome on right [G56.01]     * Carpal tunnel syndrome on left [G56.02]     Post-Operative Diagnosis: same, see immediate post op progress note     Pertinent Operative Findings:  None     Attending Surgeon: Ross Marcus, MD     Assistant(s): NA    Anesthesia Type: Monitor Anesthesia Care    Estimated Blood Loss:  Less than 15 cc (arm tourniquet per operating room time)    Complications (not routinely expected or not inherent to difficulty/nature of procedure):  None    Drains:  None    Specimens/ Cultures:  None    Indications:  Patient presented to the office complaining of symptomatic objective and electrodiagnostic bilateral carpal tunnel syndrome.  Right far worse than left.  Risks benefits and treatment alternatives for individual procedures versus combination procedure, including injection of the left carpal tunnel today, were discussed in detail and accepted by the patient.  He requested we proceed as indicated below           Description of Procedure:     The patient was brought to the operating room and placed in the supine position.  Following appropriate anesthesia, the patient was prepped and draped in normal sterile fashion.  The patient was identified in the preoperative time-out for the appropriate procedure.   The arm was exsanguinated and tourniquet inflated.  Local anesthesia was instilled along the planned incision in the distal palmar crease.  At the distal wrist a curvilinear incision made in the intrathenar base to the distal wrist crease where a 90 degree back cut was performed.  Skin flap was elevated and  the palmaris longus tendon identified.  Further dissection was carried ulnar to this structure.  Volar carpal ligament identified.  Ligament divided and division carried proximally and distally for a proximally 1 cm each direction.  Contents of the canal were next explored.  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  No other musculotendinous abnormalities.  Bony arc palpated felt to be normal architecture.  Median nerve evaluated found hourglass deformity approximately 10-15% cross-sectional diameter some reactive hyperemia.  Motor branch visualized entering the base of the thenar eminence.  Tourniquet was released and hemostasis achieved where appropriate using electrocautery.  Wounds were closed with interrupted simple and mattress sutures of 4-0 black nylon.  Attention was now turned to the contralateral left wrist where the carpal tunnel region was accessed with a fine gauge needle, injected with water-soluble corticosteroid.  The needle was withdrawn.  Xeroform sterile gauze dressings were applied.  Patient was now awakened taken to recovery room for eventual discharge to home.  Patient received instructions regarding care of wounds.  Patient advised no antibiotics generally necessary for the procedure.  Patient given prescription for pain medication.  Patient given follow-up appointment.  No change to home medication profile or diet occurred as result of surgical procedure      Ross Marcus, MD

## 2023-06-24 NOTE — Anesthesia Postprocedure Evaluation (Signed)
Anesthesia Post Op Evaluation    Patient: [name] [name]  Procedure(s):  RELEASE CARPAL TUNNEL  INJECTION CARPAL TUNNEL    Last Vitals:Temperature: 36.7 C (98 F) (06/24/23 0805)  Heart Rate: 85 (06/24/23 0830)  BP (Non-Invasive): 131/76 (06/24/23 0830)  Respiratory Rate: (!) 23 (06/24/23 0830)  SpO2: (!) 85 % (06/24/23 0830)    No notable events documented.    Patient is sufficiently recovered from the effects of anesthesia to participate in the evaluation and has returned to their pre-procedure level.  Patient location during evaluation: PACU       Patient participation: complete - patient participated  Level of consciousness: awake and alert and responsive to verbal stimuli    Pain management: adequate  Airway patency: patent    Anesthetic complications: no  Cardiovascular status: acceptable  Respiratory status: acceptable  Hydration status: acceptable  Patient post-procedure temperature: Pt Normothermic   PONV Status: Absent  Comments: Patient without complaints.  Sats low upon presentation this AM.  Patient awake and breathing fine.  Will use inhaler and closely monitor.  Wishes to go home.

## 2023-06-24 NOTE — H&P (Signed)
[facility], Surgery Dept   [address]  [place] [address]  [id number]  History and Physical    Name: [name] [name]  MRN: [id number]  DOB:[birth year]  Date:  06/24/2023      History of Present Illness: [name] [name] is a 57 y.o. , right hand dominant, male who presents for RELEASE CARPAL TUNNEL (Right: Wrist) and INJECTION CARPAL TUNNEL (Left: Wrist) today with Dr. Mickle Asper. Patient complains of progressively worsening symptoms involving b/l hands (R>L) for several years. Patient has PSH for cervical spine operation and lumbar spine operation; please see notes in Epic. Despite these procedures, patient continues to have b/l carpal tunnel symptom complaints. Patient complains of constant "numbness" in b/l hands "like a glove". He states it intermittently radiates proximally towards his elbows. He complains of nocturnal tingling that wakes him from sleep. He reports decreased grip strength and difficulty with fine motor movements.     OF NOTE: Patient has history of COPD and reports his SpO2 runs between 88-90%. He does not wear supplemental O2. He denies current SOB. Patient was seen by Dr. Tresa Endo with anesthesia in preop clinic who ordered a breathing treatment to be given preoperatively. Clovis Pu, RN is aware of this order.    Also, patient takes Suboxone which he purchases from the "black market". He reports he took approximately 1 mg at 3 am. This will be reported to Dr. Mickle Asper and anesthesia.       Patient denies other current complaints.         Past Medical History:   Past Medical History:   Diagnosis Date    Cervical stenosis of spinal canal     Chronic gum disease     RISK OF TEETH FALLING OUT    Chronic obstructive pulmonary disease (CMS HCC) 04/29/2023    Chronic pain     COPD (chronic obstructive pulmonary disease) (CMS HCC)     CVD (cerebrovascular disease)     Exertional dyspnea 04/29/2023    Gait disturbance     Gout     History of anesthesia complications     PATIENT STATED TEETH AS RISK OF  BEING KNOCKED OUT DUE TO GUM DISEASE    History of substance use 04/29/2023    Hyperlipidemia     Kidney stones     Laceration of leg 02/2023    Leg weakness, bilateral     Low back pain     Lumbar radiculopathy     MRSA infection 2013    toe surgery    Neck pain     Neck problem     Oxygen dependent     Wears glasses        Past Surgical History:  Past Surgical History:   Procedure Laterality Date    HX CERVICAL SPINE SURGERY  02/18/2023    HX FOOT SURGERY Left     jont implant left toe    HX HERNIA REPAIR Left     inguinal    LITHOTRIPSY      LUMBAR DISC SURGERY  05/2023       Current Medications:  Current Outpatient Medications   Medication Instructions    acetaminophen (TYLENOL) 650 mg, Oral, EVERY 6 HOURS    albuterol (PROVENTIL) 2.5 mg, Nebulization, 3 TIMES DAILY PRN    allopurinoL (ZYLOPRIM) 100 mg, Oral, 2 TIMES DAILY    buprenorphine HCl/naloxone HCl (SUBOXONE SL) 2 mg, Sublingual, DAILY, ASKED PATIENT WHAT THE MG WAS ON THIS MEDICATION AND He STATES THAT  He DOES NOT HAVE A PRESCRIPTION FOR IT He BUYS IT OFF THE "BLACK MARKET"<BR>It's an 8mg  strip that he cuts into 8 pieces , uses 1-2 pieces at a time    Ibuprofen (MOTRIN) 800 mg, Oral, 4 TIMES DAILY PRN        Allergies:  Allergies   Allergen Reactions    Naproxen Nausea/ Vomiting       Social History:  Social History     Socioeconomic History    Marital status: Divorced   Tobacco Use    Smoking status: Former     Current packs/day: 1.00     Types: Cigarettes     Passive exposure: Past    Smokeless tobacco: Never   Vaping Use    Vaping status: Every Day    Substances: Nicotine   Substance and Sexual Activity    Alcohol use: Not Currently    Drug use: Yes     Comment: suboxone off black market    Sexual activity: Not Currently   Other Topics Concern    Ability to Walk 1 Flight of Steps without SOB/CP Yes    Ability to Walk 2 Flight of Steps without SOB/CP No    Ability To Do Own ADL's Yes     Social Determinants of Health     Social Connections: Low Risk   (06/21/2023)    Social Connections     SDOH Social Isolation: 5 or more times a week       Family History:  Family Medical History:       Problem Relation (Age of Onset)    COPD Mother    Seizures Sister             ROS  Review of Systems   Constitutional:  Negative for chills and fever.   HENT: Negative.     Respiratory:  Negative for cough and shortness of breath.    Cardiovascular:  Negative for chest pain and palpitations.   Gastrointestinal:  Negative for abdominal pain, nausea and vomiting.   Genitourinary: Negative.    Musculoskeletal:         See HPI   Skin:  Negative for rash.   Neurological:  Negative for dizziness and headaches.        See HPI        Vitals  Vitals:    06/24/23 0631   BP: (!) 154/96   Pulse: 99   Resp: 20   Temp: (!) 35.9 C (96.6 F)   SpO2: (!) 89%   Weight: 131 kg (287 lb 14.7 oz)   Height: 1.88 m (6\' 2" )   BMI: 37.04           Physical Exam  Physical Exam  Vitals reviewed.   Constitutional:       Appearance: Normal appearance. He is obese.   HENT:      Mouth/Throat:      Mouth: Mucous membranes are moist.      Pharynx: Oropharynx is clear.      Comments: Poor dentition   Eyes:      Pupils: Pupils are equal, round, and reactive to light.   Cardiovascular:      Rate and Rhythm: Normal rate and regular rhythm.      Pulses: Normal pulses.      Heart sounds: No murmur heard.  Pulmonary:      Effort: Pulmonary effort is normal.      Breath sounds: Wheezing present. No rhonchi.  Comments: Decreased breath sounds  Fine wheezes lower lung fields b/l   Abdominal:      General: Bowel sounds are normal.      Palpations: Abdomen is soft.      Tenderness: There is no abdominal tenderness.   Musculoskeletal:         General: Normal range of motion.      Right lower leg: No edema.      Left lower leg: No edema.      Comments: Grips equal  Ambulates independently    Skin:     General: Skin is warm and dry.      Findings: No rash.      Comments: RIGHT HAND/ WRIST: warm to touch; interspaces are  clean and dry; no lesions or wounds; no rash   LEFT HAND/ WRIST: warm to touch; interspaces are clean and dry; no lesions or wounds; no rash    Neurological:      Mental Status: He is alert and oriented to person, place, and time.      Comments: Subjectively reports sensation to light touch intact and equal to distal tips of UE digits b/l            Assessment:   57 y.o. 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 Mickle Asper.       Dorinda Hill, [place]

## 2023-06-24 NOTE — Anesthesia Transfer of Care (Signed)
ANESTHESIA TRANSFER OF CARE   [name] [name] is a 57 y.o. ,male, Weight: 131 kg (287 lb 14.7 oz)   had Procedure(s):  RELEASE CARPAL TUNNEL  INJECTION CARPAL TUNNEL  performed  06/24/23   Primary Service: Ross Marcus, MD    Past Medical History:   Diagnosis Date    Cervical stenosis of spinal canal     Chronic gum disease     RISK OF TEETH FALLING OUT    Chronic obstructive pulmonary disease (CMS HCC) 04/29/2023    Chronic pain     COPD (chronic obstructive pulmonary disease) (CMS HCC)     CVD (cerebrovascular disease)     Exertional dyspnea 04/29/2023    Gait disturbance     Gout     History of anesthesia complications     PATIENT STATED TEETH AS RISK OF BEING KNOCKED OUT DUE TO GUM DISEASE    History of substance use 04/29/2023    Hyperlipidemia     Kidney stones     Laceration of leg 02/2023    Leg weakness, bilateral     Low back pain     Lumbar radiculopathy     MRSA infection 2013    toe surgery    Neck pain     Neck problem     Oxygen dependent     Wears glasses       Allergy History as of 06/24/23       NAPROXEN         Noted Status Severity Type Reaction    06/21/23 0944 Markham Jordan, RN 06/11/22 Active Low Intolerance Nausea/ Vomiting    06/11/22 1117 Ward, [place], [place] 06/11/22 Active Low  Nausea/ Vomiting                  I completed my transfer of care / handoff to the receiving personnel during which we discussed:  Access, Airway, All key/critical aspects of case discussed, Analgesia, Antibiotics, Expectation of post procedure, Fluids/Product, Gave opportunity for questions and acknowledgement of understanding, Labs and PMHx  Report given to: Kandice Hams, RN    Post Location: PACU                        Additional Info:Transferred safely to pacu with O2 FM, vss                                     Last OR Temp: Temperature: 36.7 C (98 F)  ABG:  PH (ARTERIAL)   Date Value Ref Range Status   02/18/2023 7.25 (L) 7.35 - 7.45 Final     PCO2 (ARTERIAL)   Date Value Ref Range Status   02/18/2023 35 35  - 45 mm/Hg Final     PCO2 (VENOUS)   Date Value Ref Range Status   02/15/2023 60 (H) 41 - 51 mm/Hg Final     PO2 (ARTERIAL)   Date Value Ref Range Status   02/18/2023 72 (L) 83 - 108 mm/Hg Final     PO2 (VENOUS)   Date Value Ref Range Status   02/15/2023 51 mm/Hg Final     Comment:     No ranges were established for venous pO2 as manufacturer does not recommend venous sample for this test.     SODIUM   Date Value Ref Range Status   02/18/2023 133 (L) 136 - 145 mmol/L Final  POTASSIUM   Date Value Ref Range Status   05/17/2023 4.7 3.5 - 5.1 mmol/L Final     KETONES   Date Value Ref Range Status   02/15/2023 Negative Negative mg/dL Final     WHOLE BLOOD POTASSIUM   Date Value Ref Range Status   02/18/2023 4.3 3.5 - 5.1 mmol/L Final     CHLORIDE   Date Value Ref Range Status   02/18/2023 101 98 - 107 mmol/L Final     CALCIUM   Date Value Ref Range Status   05/17/2023 8.7 8.6 - 10.2 mg/dL Final     Comment:     Gadolinium-containing contrast can interfere with calcium measurement.       CALCIUM OXALATE CRYSTALS   Date Value Ref Range Status   12/05/2022 Moderate (A) None, Slight /hpf Final     Calculated P Axis   Date Value Ref Range Status   05/06/2023 76 degrees Final     Calculated R Axis   Date Value Ref Range Status   05/06/2023 37 degrees Final     Calculated T Axis   Date Value Ref Range Status   05/06/2023 40 degrees Final     IONIZED CALCIUM   Date Value Ref Range Status   02/18/2023 1.20 1.15 - 1.33 mmol/L Final     LACTATE   Date Value Ref Range Status   02/18/2023 0.6 <=1.9 mmol/L Final     HEMOGLOBIN   Date Value Ref Range Status   02/18/2023 16.7 12.0 - 18.0 g/dL Final     OXYHEMOGLOBIN   Date Value Ref Range Status   02/18/2023 97.7 (H) 90.0 - 95.0 % Final     CARBOXYHEMOGLOBIN   Date Value Ref Range Status   02/18/2023 1.3 <=3.0 % Final     MET-HEMOGLOBIN   Date Value Ref Range Status   02/18/2023 0.8 <=1.5 % Final     BASE EXCESS   Date Value Ref Range Status   02/15/2023 4.0 (H) 0.0 - 3.0 mmol/L  Final     BASE EXCESS (ARTERIAL)   Date Value Ref Range Status   02/18/2023 5.0 (H) 0.0 - 3.0 mmol/L Final     BENZODIAZEPINES COMMENTS   Date Value Ref Range Status   02/19/2023   Final     Comment:                                        See LDT message                                     Benzodiazepine Comment     BASE DEFICIT   Date Value Ref Range Status   02/18/2023 11.0 (H) 0.0 - 3.0 mmol/L Final     BICARBONATE (ARTERIAL)   Date Value Ref Range Status   02/18/2023 16.2 (L) 21.0 - 28.0 mmol/L Final     BICARBONATE (VENOUS)   Date Value Ref Range Status   02/15/2023 27.7 22.0 - 29.0 mmol/L Final     %FIO2 (VENOUS)   Date Value Ref Range Status   02/15/2023 44.0 % Final     Airway:* No LDAs found *  Blood pressure (!) 155/84, pulse 95, temperature 36.7 C (98 F), resp. rate (!) 21, height 1.88 m (6\' 2" ), weight 131 kg (287 lb 14.7 oz),  SpO2 94%.

## 2023-06-26 ENCOUNTER — Other Ambulatory Visit: Payer: Self-pay

## 2023-06-27 ENCOUNTER — Encounter (INDEPENDENT_AMBULATORY_CARE_PROVIDER_SITE_OTHER): Payer: Self-pay | Admitting: Plastic and Reconstructive Surgery

## 2023-06-27 ENCOUNTER — Ambulatory Visit (INDEPENDENT_AMBULATORY_CARE_PROVIDER_SITE_OTHER): Payer: MEDICAID | Admitting: Plastic and Reconstructive Surgery

## 2023-06-27 ENCOUNTER — Other Ambulatory Visit: Payer: Self-pay

## 2023-06-27 DIAGNOSIS — Z4801 Encounter for change or removal of surgical wound dressing: Secondary | ICD-10-CM

## 2023-06-27 DIAGNOSIS — Z4889 Encounter for other specified surgical aftercare: Secondary | ICD-10-CM

## 2023-06-27 NOTE — Progress Notes (Signed)
PLASTIC SURGERY, [facility]  [address]  [place] [address]       Name: KEIL CORAZZA MRN:  [id number]   Date: 06/27/2023 Age: 57 y.o.        This visit note created in whole or part using voice recognition software.  Some inadvertent misspellings and/or word substitutions may occur despite proofreading.  Such errors, when present should not be misconstrued by any 3rd party to attempt to alter the true facts of the visit.  Alteration, whether verbal or written, intentional, unintentional or implied, may carry penalties under [address] or [employer].  Proper interpretation of any such materials will remain the sole prerogative of the author of the note.      Medical/Surgical history reviewed.    Allergies reviewed.    Medication profile reviewed.      Subjective:         Patient presents for first post op visit  No problem with wound healing  Patient reports relief of symptoms from injection    Objective:    Incision intact with no evidence of erythema or infection  Light touch sensation present at middle phalangeal level of index and long, right  Range of motion essentially normal      Assessment:        Satisfactory postoperative visit  Maneuvers of surgery discussed with patient  Gentle range of motion and home exercise program discussed today    Plan:  As a plastic surgery specialist, I am only assessing the problems applicable and will leave medical problems to the attending physician.   Return 1 week for suture removal    Ross Marcus, MD  06/27/2023 13:04

## 2023-07-01 ENCOUNTER — Encounter (INDEPENDENT_AMBULATORY_CARE_PROVIDER_SITE_OTHER): Payer: Self-pay | Admitting: NURSE PRACTITIONER, FAMILY

## 2023-07-04 ENCOUNTER — Encounter (INDEPENDENT_AMBULATORY_CARE_PROVIDER_SITE_OTHER): Payer: Self-pay | Admitting: Plastic and Reconstructive Surgery

## 2023-07-04 ENCOUNTER — Other Ambulatory Visit: Payer: Self-pay

## 2023-07-04 ENCOUNTER — Ambulatory Visit (INDEPENDENT_AMBULATORY_CARE_PROVIDER_SITE_OTHER): Payer: MEDICAID | Admitting: Plastic and Reconstructive Surgery

## 2023-07-04 DIAGNOSIS — Z4889 Encounter for other specified surgical aftercare: Secondary | ICD-10-CM

## 2023-07-04 DIAGNOSIS — Z4802 Encounter for removal of sutures: Secondary | ICD-10-CM

## 2023-07-04 DIAGNOSIS — Z4800 Encounter for change or removal of nonsurgical wound dressing: Secondary | ICD-10-CM

## 2023-07-04 NOTE — Progress Notes (Signed)
[facility], [facility]  [address]  [place] [address]       Name: KADARI FIER MRN:  [id number]   Date: 07/04/2023 Age: 57 y.o.        This visit note created in whole or part using voice recognition software.  Some inadvertent misspellings and/or word substitutions may occur despite proofreading.  Such errors, when present should not be misconstrued by any 3rd party to attempt to alter the true facts of the visit.  Alteration, whether verbal or written, intentional, unintentional or implied, may carry penalties under [address] or [employer].  Proper interpretation of any such materials will remain the sole prerogative of the author of the note.      Medical/Surgical history reviewed.    Allergies reviewed.    Medication profile reviewed.      Subjective:         No problem with wound healing  Follow up visit for suture removal from operative procedure.     Objective:    Incision intact with no evidence of erythema or infection  Light touch sensation present at MCP joints of index and long  Range of motion essentially normal      Assessment:        Satisfactory postoperative visit  Pathology discussed with patient  Offered hand therapy and patient declined  Sutures removed today  Scar massage discussed today  Gentle range of motion and home exercise program discussed today    Plan:  As a plastic surgery specialist, I am only assessing the problems applicable and will leave medical problems to the attending physician.   Return 3-4 weeks for routine follow-up (recommended)    Ross Marcus, MD  07/04/2023 11:10

## 2023-07-25 ENCOUNTER — Encounter (INDEPENDENT_AMBULATORY_CARE_PROVIDER_SITE_OTHER): Payer: Self-pay | Admitting: Plastic and Reconstructive Surgery

## 2023-07-25 ENCOUNTER — Other Ambulatory Visit: Payer: Self-pay

## 2023-07-25 ENCOUNTER — Ambulatory Visit (INDEPENDENT_AMBULATORY_CARE_PROVIDER_SITE_OTHER): Payer: MEDICAID | Admitting: Plastic and Reconstructive Surgery

## 2023-07-25 DIAGNOSIS — Z4889 Encounter for other specified surgical aftercare: Secondary | ICD-10-CM

## 2023-08-05 ENCOUNTER — Other Ambulatory Visit (INDEPENDENT_AMBULATORY_CARE_PROVIDER_SITE_OTHER): Payer: Self-pay | Admitting: Family Medicine

## 2023-08-09 ENCOUNTER — Other Ambulatory Visit (INDEPENDENT_AMBULATORY_CARE_PROVIDER_SITE_OTHER): Payer: MEDICAID

## 2023-08-09 ENCOUNTER — Ambulatory Visit: Payer: MEDICAID | Attending: Neurological Surgery

## 2023-08-09 ENCOUNTER — Encounter (INDEPENDENT_AMBULATORY_CARE_PROVIDER_SITE_OTHER): Payer: Self-pay | Admitting: Neurological Surgery

## 2023-08-09 ENCOUNTER — Other Ambulatory Visit: Payer: Self-pay

## 2023-08-09 ENCOUNTER — Ambulatory Visit (HOSPITAL_BASED_OUTPATIENT_CLINIC_OR_DEPARTMENT_OTHER)
Admission: RE | Admit: 2023-08-09 | Discharge: 2023-08-09 | Disposition: A | Discharge status: Home / Self Care | Payer: MEDICAID | Source: Ambulatory Visit

## 2023-08-09 VITALS — BP 151/86 | HR 111 | Temp 95.5°F | Ht 74.0 in | Wt 301.4 lb

## 2023-08-09 DIAGNOSIS — M25551 Pain in right hip: Secondary | ICD-10-CM | POA: Insufficient documentation

## 2023-08-09 DIAGNOSIS — M47816 Spondylosis without myelopathy or radiculopathy, lumbar region: Secondary | ICD-10-CM

## 2023-08-09 DIAGNOSIS — M48061 Spinal stenosis, lumbar region without neurogenic claudication: Secondary | ICD-10-CM | POA: Insufficient documentation

## 2023-08-09 DIAGNOSIS — M4802 Spinal stenosis, cervical region: Secondary | ICD-10-CM

## 2023-08-09 DIAGNOSIS — M5416 Radiculopathy, lumbar region: Secondary | ICD-10-CM | POA: Insufficient documentation

## 2023-08-09 DIAGNOSIS — M5126 Other intervertebral disc displacement, lumbar region: Secondary | ICD-10-CM

## 2023-08-09 DIAGNOSIS — Z981 Arthrodesis status: Secondary | ICD-10-CM | POA: Insufficient documentation

## 2023-08-09 DIAGNOSIS — R52 Pain, unspecified: Secondary | ICD-10-CM | POA: Insufficient documentation

## 2023-08-09 DIAGNOSIS — R2689 Other abnormalities of gait and mobility: Secondary | ICD-10-CM | POA: Insufficient documentation

## 2023-08-09 NOTE — Progress Notes (Signed)
NEUROSURGERY, [facility]  [address]  [place] [address]  Operated by [facility]  Progress Note    Name: [name] [name] MRN:  [id number]   Date: 08/09/2023 DOB:  [birth year] (57 y.o.)       Referring Provider:   Cecilie Kicks, DO  [address]  [place],  [place] [address]    Subjective:   [name] [name] is a 57 year old male with history of C3-C4 ACDF (Dr Michail Jewels on 02/05/23) who is s/p  L4-L5 posterior interbody fusion with use of intraoperative fluoroscopy and intraoperative neuromonitoring by Dr. Michail Jewels on 05/16/23 for L4-L5 spondylolisthesis and disc herniation.  The patient was last seen in clinic on 06/19/23 at which time he was advised to follow up in clinic in 6 weeks with x-rays.     Today in clinic the patient reports that he feels his lumbar spine is healing well, he notes very minor residual numbness in the lateral toes of his bilateral feet which he notes is chronic. He reports that his low back pain is almost entirely resolved, but he now has severe pain in his right hip that has progressively worsened since August. He complains of severe, sharp right hip pain that radiates to the right groin, currently rates it as 9/10 in severity. He notes that his hip pain is worsened with movement of the right leg and walking, feels it is causing some weakness in the right leg.     Objective:   Vital Signs:  BP (!) 151/86   Pulse (!) 111   Temp (!) 35.3 C (95.5 F) (Thermal Scan)   Ht 1.88 m (6\' 2" )   Wt (!) 137 kg (301 lb 5.9 oz)   BMI 38.69 kg/m     Examination  Constitutional:Well groomed. Grimacing on exam.  Skin:  Warm, dry, good color. No wounds or incisions  Head:  Normocephalic/ Atraumatic.   Eyes: Conjunctiva clear, no obvious abnormalities; EOMI. PERRL.  ENT:  Tongue midline, trachea midline   Cardiovascular:    Peripheral vascular system: Pulses palpable, no peripheral edema   Musculoskeletal  Gait and Station: slow and antalgic  Strength:  Arm Right Left Leg Right Left    Deltoid (shoulder abduction) 5/5 5/5 Hip Flexion 3/5 5/5   Biceps (Elbow flexion) 5/5 5/5 Knee Flexion 5/5 5/5   Triceps (Elbow extension) 5/5 5/5 Knee Extension 5/5 5/5   Grip 5/5 5/5 Foot Dorsiflexion 5/5 5/5      Foot Plantarflexion 5/5 5/5   Muscle tone (upper extremities): WNL  Muscle tone (lower extremities): WNL  Sensory: Sensory exam in the upper and lower extremities is normal  Straight leg raise: Negative   Musculoskeletal tenderness: Severe discomfort with right hip flexion and active ROM of the right hip, unable to full assess secondary to discomfort  Neurological  Level of consciousness: Alert and oriented  Recent and remote memory: Good recall and able to follow commands  Attention span and concentration: Normal in conversation  Language/Speech: No aphasia or dysarthria  Fund of knowledge: Appropriate in this setting  Cranial Nerves  Cranial nerves II to XII grossly intact.     Data reviewed    08/09/23 XR Lumbar   Radiology read still pending     Discussions with other providers:   Reviewed the chart notes.   Discussed the patient's case and reviewed available imaging with Dr. Michail Jewels    Assessment:    Assessment/Plan   1. Right hip pain  2. Antalgic gait    3. Limping    4. Acute pain    5. S/P lumbar fusion        Recommendations:  Orders Placed This Encounter    XR HIP RIGHT    MRI HIP RIGHT WO CONTRAST    XR LUMBAR SPINE AP AND LAT    Referral to ORTHOPAEDIC -  [place] locations     -The natural history, film findings, and indications for treatment were discussed.  -X-ray of right hip and referral to orthopedics placed for patient's complaints of severe right hip pain, MRI right hip ordered for consultation  -Official radiology read remains pending, will call the patient to inform them of any unexpected, remarkable findings  -F/u in clinic in 3 months with x-ray of lumbar spine, sooner if needed  -The patient has been advised to follow up with their PCP in regard any chronic medical conditions and  any non-neurosurgical symptoms that they may have   -Continue Medical Management (Diet, Exercise, Medication).     Particia Jasper, APRN,AGACNP-BC  This patient was seen independently.

## 2023-08-11 DIAGNOSIS — M5126 Other intervertebral disc displacement, lumbar region: Secondary | ICD-10-CM

## 2023-08-11 DIAGNOSIS — M4802 Spinal stenosis, cervical region: Secondary | ICD-10-CM

## 2023-08-11 DIAGNOSIS — M5416 Radiculopathy, lumbar region: Secondary | ICD-10-CM

## 2023-08-11 DIAGNOSIS — Z981 Arthrodesis status: Secondary | ICD-10-CM

## 2023-08-11 DIAGNOSIS — M48061 Spinal stenosis, lumbar region without neurogenic claudication: Secondary | ICD-10-CM

## 2023-08-11 DIAGNOSIS — M47816 Spondylosis without myelopathy or radiculopathy, lumbar region: Secondary | ICD-10-CM

## 2023-08-12 ENCOUNTER — Ambulatory Visit (HOSPITAL_BASED_OUTPATIENT_CLINIC_OR_DEPARTMENT_OTHER): Payer: Self-pay

## 2023-08-12 NOTE — Telephone Encounter (Addendum)
Next new      ----- Message from Crystal C sent at 08/12/2023  1:07 PM EDT -----  Patient has an ASAP referral to be seen for: severe right hip pain.  Feliz Beam reviewed and stated he should see joint service.  Please advise.  Thank you!!

## 2023-08-13 DIAGNOSIS — M25551 Pain in right hip: Secondary | ICD-10-CM

## 2023-08-13 DIAGNOSIS — R2689 Other abnormalities of gait and mobility: Secondary | ICD-10-CM

## 2023-08-22 ENCOUNTER — Other Ambulatory Visit: Payer: Self-pay

## 2023-08-22 ENCOUNTER — Encounter (INDEPENDENT_AMBULATORY_CARE_PROVIDER_SITE_OTHER): Payer: Self-pay | Admitting: Plastic and Reconstructive Surgery

## 2023-08-22 ENCOUNTER — Ambulatory Visit (INDEPENDENT_AMBULATORY_CARE_PROVIDER_SITE_OTHER): Payer: MEDICAID | Admitting: Plastic and Reconstructive Surgery

## 2023-08-22 DIAGNOSIS — G5602 Carpal tunnel syndrome, left upper limb: Secondary | ICD-10-CM

## 2023-08-27 ENCOUNTER — Encounter (INDEPENDENT_AMBULATORY_CARE_PROVIDER_SITE_OTHER): Payer: Self-pay | Admitting: Plastic and Reconstructive Surgery

## 2023-08-27 ENCOUNTER — Ambulatory Visit (HOSPITAL_BASED_OUTPATIENT_CLINIC_OR_DEPARTMENT_OTHER)
Admission: RE | Admit: 2023-08-27 | Discharge: 2023-08-27 | Disposition: A | Discharge status: Home / Self Care | Payer: MEDICAID | Source: Ambulatory Visit | Admitting: Radiology

## 2023-08-27 ENCOUNTER — Other Ambulatory Visit: Payer: Self-pay

## 2023-08-27 ENCOUNTER — Ambulatory Visit: Payer: MEDICAID | Attending: Orthopaedic Surgery | Admitting: Orthopaedic Surgery

## 2023-08-27 DIAGNOSIS — M1611 Unilateral primary osteoarthritis, right hip: Secondary | ICD-10-CM

## 2023-08-27 DIAGNOSIS — R2689 Other abnormalities of gait and mobility: Secondary | ICD-10-CM | POA: Insufficient documentation

## 2023-08-27 DIAGNOSIS — M25551 Pain in right hip: Secondary | ICD-10-CM | POA: Insufficient documentation

## 2023-08-27 DIAGNOSIS — R52 Pain, unspecified: Secondary | ICD-10-CM | POA: Insufficient documentation

## 2023-08-27 NOTE — Progress Notes (Signed)
[facility]    Chief Complaint:    Chief Complaint   Patient presents with    Hip Pain        HPI:  This is a 57 y.o. year old patient who presents with a Several month history of right hip pain, instability, and stiffness.  Pain is located in the anterior, medial hip.    The pain limits the ability to walk, work, perform house work, and perform yard work.   he has tried Advil (ibuprofen), Tylenol (acetaminophen), and Physical therapy.   he rates the pain a 8/10 at its very worst.  Pain is worse with walking and weight bearing and relieved with rest.  Associated symptoms include locking, catching, clicking, popping, stiffness, nighttime pain, and giving way (instability).    PMH:  Past Medical History:   Diagnosis Date    Cervical stenosis of spinal canal     Chronic gum disease     RISK OF TEETH FALLING OUT    Chronic obstructive pulmonary disease (CMS HCC) 04/29/2023    Chronic pain     COPD (chronic obstructive pulmonary disease) (CMS HCC)     CVD (cerebrovascular disease)     Exertional dyspnea 04/29/2023    Gait disturbance     Gout     History of anesthesia complications     PATIENT STATED TEETH AS RISK OF BEING KNOCKED OUT DUE TO GUM DISEASE    History of substance use 04/29/2023    Hyperlipidemia     Kidney stones     Laceration of leg 02/2023    Leg weakness, bilateral     Low back pain     Lumbar radiculopathy     MRSA infection 2013    toe surgery    Neck pain     Neck problem     Oxygen dependent     Wears glasses          PSH:  Past Surgical History:   Procedure Laterality Date    HX CERVICAL SPINE SURGERY  02/18/2023    HX FOOT SURGERY Left     jont implant left toe    HX HERNIA REPAIR Left     inguinal    LITHOTRIPSY      LUMBAR DISC SURGERY  05/2023         Medications:    Current Outpatient Medications:     acetaminophen (TYLENOL) 325 mg Oral Tablet, Take 2 Tablets (650 mg total) by mouth Every 6 hours (Patient taking differently: Take 2 Tablets (650 mg total) by mouth  Every 6 hours as needed), Disp: , Rfl:     albuterol (PROVENTIL) 2.5 mg/0.5 mL Inhalation Solution for Nebulization, Take 0.5 mL (2.5 mg total) by nebulization Three times a day as needed, Disp: , Rfl:     allopurinoL (ZYLOPRIM) 100 mg Oral Tablet, TAKE 1 TABLET BY MOUTH 2 TIMES A DAY, Disp: 60 Tablet, Rfl: 1    buprenorphine HCl/naloxone HCl (SUBOXONE SL), Place 2 mg under the tongue Once a day ASKED PATIENT WHAT THE MG WAS ON THIS MEDICATION AND He STATES THAT He DOES NOT HAVE A PRESCRIPTION FOR IT He BUYS IT OFF THE "[employer]" It's an 8mg  strip that he cuts into 8 pieces , uses 1-2 pieces at a time, Disp: , Rfl:     Ibuprofen (MOTRIN) 200 mg Oral Tablet, Take 4 Tablets (800 mg total) by mouth Four times a day as needed for Pain, Disp: , Rfl:  Allergy:    Allergies   Allergen Reactions    Naproxen Nausea/ Vomiting       Family History:    Family Medical History:       Problem Relation (Age of Onset)    COPD Mother    Seizures Sister            Denies a family history of a hypercoagulable condition      Social History:    Occupation:   Glass blower/designer  Last worked:  yesterday  Marital status:  Married  Patient resides:  With spouse  Current tobacco use:  None  Current alcohol use:   weekly  Recreational drug use:  None                                                                                                                                                                                      Review of Systems:  The ROS is negative for fevers, chills, and sweats  Denies recent hospitalization and ER visits  The remainder of the review of systems is negative    Exam:  Temp 36 C (96.8 F)   Ht 1.83 m (6' 0.05")   Wt 136 kg (299 lb 13.2 oz)   BMI 40.61 kg/m       Appearance:  well nourished, well developed  HEENT is Head: Normocephalic, no lesions, without obvious abnormality.  Breathing is Normal/Unlabored  Gait:  Stable without assistive device and antalgic  Deformity:  neutral  ROM of the right hip:   limited secondary to pain and stiffness with FADIR  Tenderness:  anterior and medial   Neuro:  normal   Vascular:  present 2+  Reflexes:  normal  Skin:  color normal, vascularity normal, no evidence of bleeding or bruising, no lesions noted, no edema, temperature normal, texture normal, mobility and turgor normal, and nails normal without clubbing      X-rays:  Today's images of the right hip reveal severe DJD as evidenced by bone on bone joint space narrowing and subchondral sclerosis and osteophytosis.      Impression:      ICD-10-CM    1. Right hip pain  M25.551 Right [facility] x-ray      2. Antalgic gait  R26.89       3. Limping  R26.89       4. 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  The patient has tried and failed tylenol, ibuprofen, and physical therapy.   He has given up much of their usual activity due to the arthritis pain and stiffness.   Nothing short of a right total Hip replacement will relieve pain and improve function.   We reviewed details of the surgery, risks, and expectations for recovery.   We will begin the scheduling process.  OMOP clearance is required. The patient was given a joint book today.   Encouraged weight loss today. Current BMI is 40.61. Goal weight of 290 provided but less is encouraged.  At this time, our plan is to move forward in the process towards total joint replacement. This decision will be confirmed at the patient's pre-op H&P.  We will see him back for their pre operative visit.  We discussed post discharge care and disposition.  Ample time was given to answer questions.    The patient was instructed to call or MyChart message with any additional concerns or questions.    he will follow up at the pre op H & P   he  was encouraged to call with questions or concerns.    This patient  was seen independently in clinic.  Veatrice Kells, PA-C  08/27/2023, 08:48      Cc    Particia Jasper, APRN,AGACNP-BC  [address]  [address]  [place],  [place] [address]    Cecilie Kicks, DO  [address]  [place] [address]

## 2023-08-27 NOTE — Progress Notes (Signed)
[facility]  [address]  [place] [address]       Name: [name] [name] MRN:  [id number]   Date: 07/25/2023 Age: 57 y.o.        This visit note created in whole or part using voice recognition software.  Some inadvertent misspellings and/or word substitutions may occur despite proofreading.  Such errors, when present should not be misconstrued by any 3rd party to attempt to alter the true facts of the visit.  Alteration, whether verbal or written, intentional, unintentional or implied, may carry penalties under [address] or [employer].  Proper interpretation of any such materials will remain the sole prerogative of the author of the note.      Medical/Surgical history reviewed.    Allergies reviewed.    Medication profile reviewed.      Subjective:         No problem with wound healing  Routine follow-up after suture removal.  Does not think he is ready for his left carpal tunnel release just yet.  Thinks he needs another month or so to think about.    Objective:    Incision intact with no evidence of erythema or infection  No evidence of distal neurovascular compromise  Range of motion essentially normal      Assessment:        Satisfactory postoperative visit  Offered hand therapy and patient declined  Scar massage discussed today  Gentle range of motion and home exercise program discussed today    Plan:  As a plastic surgery specialist, I am only assessing the problems applicable and will leave medical problems to the attending physician.   Return 3-4 weeks for routine follow-up (recommended)  We will discuss left carpal tunnel release at his next visit    Ross Marcus, MD  08/27/2023 12:54

## 2023-08-28 ENCOUNTER — Other Ambulatory Visit: Payer: Self-pay

## 2023-08-28 NOTE — H&P (Signed)
[facility]  [address]  [place] [address]       Name: [name] [name] MRN:  [id number]   Date: 08/22/2023 Age: 57 y.o.        This visit note created in whole or part using voice recognition software.  Some inverting misspellings and/or word substitutions may occur despite proofreading.  Such errors, when present should not be misconstrued by the 3rd party to attempt to alter the true facts of the visit.  Alteration, whether verbal or written, intentional, unintentional or implied, may carry penalties under [address] or [employer].  Proper interpretation of any such materials will remain the sole prerogative of the author of the note.        Current Outpatient Medications:     acetaminophen (TYLENOL) 325 mg Oral Tablet, Take 2 Tablets (650 mg total) by mouth Every 6 hours (Patient taking differently: Take 2 Tablets (650 mg total) by mouth Every 6 hours as needed), Disp: , Rfl:     albuterol (PROVENTIL) 2.5 mg/0.5 mL Inhalation Solution for Nebulization, Take 0.5 mL (2.5 mg total) by nebulization Three times a day as needed, Disp: , Rfl:     allopurinoL (ZYLOPRIM) 100 mg Oral Tablet, TAKE 1 TABLET BY MOUTH 2 TIMES A DAY, Disp: 60 Tablet, Rfl: 1    buprenorphine HCl/naloxone HCl (SUBOXONE SL), Place 2 mg under the tongue Once a day ASKED PATIENT WHAT THE MG WAS ON THIS MEDICATION AND He STATES THAT He DOES NOT HAVE A PRESCRIPTION FOR IT He BUYS IT OFF THE "[employer]" It's an 8mg  strip that he cuts into 8 pieces , uses 1-2 pieces at a time, Disp: , Rfl:     Ibuprofen (MOTRIN) 200 mg Oral Tablet, Take 4 Tablets (800 mg total) by mouth Four times a day as needed for Pain, Disp: , Rfl:      Past Medical History:   Diagnosis Date    Cervical stenosis of spinal canal     Chronic gum disease     RISK OF TEETH FALLING OUT    Chronic obstructive pulmonary disease (CMS HCC) 04/29/2023    Chronic pain     COPD (chronic obstructive pulmonary disease) (CMS HCC)     CVD (cerebrovascular  disease)     Exertional dyspnea 04/29/2023    Gait disturbance     Gout     History of anesthesia complications     PATIENT STATED TEETH AS RISK OF BEING KNOCKED OUT DUE TO GUM DISEASE    History of substance use 04/29/2023    Hyperlipidemia     Kidney stones     Laceration of leg 02/2023    Leg weakness, bilateral     Low back pain     Lumbar radiculopathy     MRSA infection 2013    toe surgery    Neck pain     Neck problem     Oxygen dependent     Wears glasses             Past Surgical History:   Procedure Laterality Date    DISCECTOMY SPINE CERVICAL ANTERIOR MICRO WITH FUSION AND INSTRUMENTATION 1 LEVEL N/A 02/18/2023    Performed by Mariann Laster, MD at [facility] OR [address]TH    FUSION SPINE POSTERIOR WITH INSTRUMENTATION TLIF AND PLIF L4-L5 N/A 05/16/2023    Performed by Mariann Laster, MD at [facility] OR [address] [facility]TH    HX CERVICAL SPINE SURGERY  02/18/2023  HX FOOT SURGERY Left     jont implant left toe    HX HERNIA REPAIR Left     inguinal    INJECTION CARPAL TUNNEL Left 06/24/2023    Performed by Ross Marcus, MD at [facility] OR PCOR    LITHOTRIPSY      LUMBAR DISC SURGERY  05/2023    RELEASE CARPAL TUNNEL Right 06/24/2023    Performed by Ross Marcus, MD at [facility] OR PCOR        Allergies   Allergen Reactions    Naproxen Nausea/ Vomiting        Subjective:    Patient returns today to discuss left carpal tunnel release.  He feels he was now psychologically and physically ready to undertake the procedure.  Feels his right hand is healed satisfactorily and it was very pleased with the results.        Objective:    General: awake alert and oriented x3, in no acute distress  Head: normocephalic   Eyes: PERRLA, EOMI  Chest: clear to auscultation x4   Cardiac: S1-S2  Abdomen: soft nontender  Skin no left hand or wrist palmar skin lesions noted   Musculoskeletal:  Essentially normal left hand wrist and finger range of motion.  No evidence of triggering of any digit.  No deformities noted.  Finkelstein's test negative left   Vascular  Adson's test negative left  Upper extremity neuro:  Phalen's test positive left wrist at 2 seconds.  Tinel's test positive at carpal tunnel.  Negative remainder of left upper extremity compression sites x4      Studies reviewed:    Previous notes this office reviewed   Previous EMG/NCV raw data and interpretation reviewed and discussed with the patient    Assessment:    Left carpal tunnel syndrome    Plan:  As a plastic surgery specialist, I am only assessing the problems applicable and will leave medical problems to the attending physician.   Risks benefits and treatment alternatives again reviewed.  The patient requesting surgical intervention.      MDM considerations  Problem low  Data review moderate  Potential morbidity assessment moderate    Scheduled for release left carpal tunnel, mac and local, [facility]      Ross Marcus, MD  08/28/2023 12:10

## 2023-09-13 NOTE — H&P (Unsigned)
[facility]  Orthopaedic Medical Optimization Program  Consult       [name] [name], [name] [name], 57 y.o. male Surgeon: Eliott Nine, MD   Date of Birth:  [birth year] Date of Service:  09/13/2023    Medical Record Number: [id number] Information Obtained from: patient and history reviewed via medical record     Chief Complaint: New Patient Visit    History of Present Illness:  [name] [name] is a 57 y.o. male that presents with need for pre-operative optimization.  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    COPD:     CVA:     + hx MRSA:     Morbid obesity: Weight goal 290 lbs per surgery team.     Nicotine use:     Hx illicit substance use: Was using Suboxone that was not prescribed to him. This was found during an admission 4/24 on an admission for respiratory failure and weakness blamed on cervical spine stenosis. There was other positives on this drug screen but I'm unsure what he may have received in the hospital prior to the test.     Last DEXA: Under 65    MEDICAL HISTORY     Past Medical History:   Diagnosis Date    Cervical stenosis of spinal canal     Chronic gum disease     RISK OF TEETH FALLING OUT    Chronic obstructive pulmonary disease (CMS HCC) 04/29/2023    Chronic pain     COPD (chronic obstructive pulmonary disease) (CMS HCC)     CVD (cerebrovascular disease)     Exertional dyspnea 04/29/2023    Gait disturbance     Gout     History of anesthesia complications     PATIENT STATED TEETH AS RISK OF BEING KNOCKED OUT DUE TO GUM DISEASE    History of substance use 04/29/2023    Hyperlipidemia     Kidney stones     Laceration of leg 02/2023    Leg weakness, bilateral     Low back pain     Lumbar radiculopathy     MRSA infection 2013    toe surgery    Neck pain     Neck problem     Oxygen dependent     Wears glasses      Past Surgical History:   Procedure Laterality Date    HX CERVICAL SPINE SURGERY  02/18/2023    HX FOOT SURGERY Left     jont  implant left toe    HX HERNIA REPAIR Left     inguinal    LITHOTRIPSY      LUMBAR DISC SURGERY  05/2023      Allergies   Allergen Reactions    Naproxen Nausea/ Vomiting     No outpatient medications have been marked as taking for the 09/16/23 encounter (Appointment) with Tommy Rainwater, MD.     Social History     Tobacco Use    Smoking status: Former     Current packs/day: 1.00     Types: Cigarettes     Passive exposure: Past    Smokeless tobacco: Current    Tobacco comments:     vapes   Vaping Use    Vaping status: Every Day    Substances: Nicotine   Substance Use Topics    Alcohol use: Not Currently    Drug use: Not Currently     Comment: suboxone off [employer]     Family Medical History:       Problem Relation (Age of Onset)    COPD Mother    Seizures Sister                History of sleep apnea:      {Yes/No:21034203}     Dental issues that need addressed prior to surgery:        {Yes/No:21034203}     Personal or family history of blood clots or bleeding disorders:     {Yes/No:21034203}     Personal of family history of complications from anesthesia:      {Yes/No:21034203}     History of pulmonary disorders:      {Yes/No:21034203}     History of MI/CVA/cardiac stents/cardiac surgeries:      {Yes/No:21034203}     Lower extremity cellulitis:      {Yes/No:21034203}     Last steroid injection(s):     ***     REVIEW OF SYSTEMS     Review of Systems   Constitutional: Negative for activity change, chills, nightsweats and fever.   HENT: Negative.    Respiratory: Negative for cough, chest tightness, shortness of breath and wheezing.    Cardiovascular: Negative for chest pain and palpitations.   Gastrointestinal: Negative.  Negative for abdominal distention, abdominal pain, constipation and diarrhea.   Genitourinary: Negative for difficulty urinating.   Musculoskeletal: Positive for arthralgias  Skin: Negative.    Neurological: Negative for dizziness and headaches.   Psychiatric/Behavioral: Negative for agitation,  confusion and decreased concentration. The patient is not nervous/anxious.        EXAM     There were no vitals taken for this visit.      There is no height or weight on file to calculate BMI.  Physical Exam    DIAGNOSTIC STUDIES     IMAGING:  I have personally reviewed the following reports:    Coding:   MDM  Reviewed: previous chart, nursing note and vitals  Reviewed previous: x-ray, ECG and labs  Interpretation: labs, ECG and x-ray  Consults: orthopedics    FRAX ASSESSMENT  -Age: 57 y.o.  -Sex: male  -Weight (kg):    -Height (cm):    -Previous low energy fracture: ***  -Parent fractured hip: ***  -Current smoking: ***  -Glucocorticoids (for >3 months): ***  -Rheumatoid arthritis: ***  -Disorder strongly associated with osteoporosis: ***  -Alcohol 3 or more units per day: ***      Previous cardiac testing: 6/24 MPS    Clinically and electrocardiographically negative regadenoson conversion ECG portion of the study.    Post-stress ejection fraction was 44%.    TTE 3/24  Conclusions:  The left ventricular ejection fraction by visual assessment is estimated to be 55-60%.  Mild left ventricular hypertrophy.  There is mild tricuspid regurgitation.    EKG 7/24  NSR    PRE-OPERATIVE ASSESSMENT     Patient endorses the following symptoms:  {PREOP YNWGNFAO:1308657}    Revised Goldman Cardiac Risk Index:  Of the six independent predictors of major cardiac complications, patient exhibits the following:  {GOLDMAN RISK SELECTION:2103411}           Total Points: {NUMBERS 0-5:23457}    Rate of cardiac death, nonfatal myocardial infarction, and nonfatal cardiac arrest  {GOLDMAN RISK INDEX:200345}  This represents the percentage risk of major cardiac complications (cardiac death, non-fatal MI, non-fatal cardiac arrest, post-operative cardiogenic pulmonary edema, and/or complete heart block).    ASA: {ASA  GRADE:110003}    METs: {METS (metabolic equivlalents):44916}      ASSESSMENT/PLAN     Problem List as of 09/16/2023 Reviewed:  08/27/2023 12:53 PM by Ross Marcus, MD      Cervical stenosis of spine    Chronic obstructive pulmonary disease (CMS HCC)    Exertional dyspnea    History of substance use    Lumbar spondylolysis    Lumbar stenosis s/p L4-L5 interbody fusion    Peripheral edema    S/P lumbar fusion    Vapes nicotine containing substance     Right hip osteoarthritis  Pre operative exam  Extremity pain   + hx MRSA  - Patient has failed conservative therapy, operative management is planned  - Pre-operative labs ordered, pending  - EKG reviewed by myself and demonstrates NSR 05/2023. Negative MPS 6/24  - Patient is without risk factors for excessive bleeding or blood clots.  - Pain management as per surgical team    - Antibiotic prophylaxis: Vanc, Ancef  - VTE prophylaxis: ASA    Morbid obesity   - weight goal 290 lbs     Hx substance use, Suboxone not prescribed  - urine drug screen today     Nicotine dependence   -     COPD  - Continue albuterol     No orders of the defined types were placed in this encounter.       Tommy Rainwater, MD    Orthopaedic Medical Optimization Program  Department of Orthopaedics  [facility]  [place]

## 2023-09-16 ENCOUNTER — Encounter (HOSPITAL_BASED_OUTPATIENT_CLINIC_OR_DEPARTMENT_OTHER): Payer: Self-pay | Admitting: Internal Medicine

## 2023-09-16 ENCOUNTER — Encounter (HOSPITAL_COMMUNITY): Payer: Self-pay

## 2023-09-16 ENCOUNTER — Other Ambulatory Visit: Payer: Self-pay

## 2023-09-16 ENCOUNTER — Ambulatory Visit (HOSPITAL_COMMUNITY): Payer: Self-pay

## 2023-09-16 ENCOUNTER — Encounter (HOSPITAL_BASED_OUTPATIENT_CLINIC_OR_DEPARTMENT_OTHER): Payer: Self-pay

## 2023-09-16 ENCOUNTER — Inpatient Hospital Stay
Admission: RE | Admit: 2023-09-16 | Discharge: 2023-09-16 | Disposition: A | Discharge status: Home / Self Care | Payer: MEDICAID | Source: Ambulatory Visit | Attending: Plastic and Reconstructive Surgery

## 2023-09-16 ENCOUNTER — Ambulatory Visit: Payer: MEDICAID | Attending: Internal Medicine | Admitting: Internal Medicine

## 2023-09-16 VITALS — BP 146/74 | HR 102 | Temp 98.1°F | Ht 72.13 in | Wt 312.0 lb

## 2023-09-16 DIAGNOSIS — M79609 Pain in unspecified limb: Secondary | ICD-10-CM | POA: Insufficient documentation

## 2023-09-16 DIAGNOSIS — Z87898 Personal history of other specified conditions: Secondary | ICD-10-CM | POA: Insufficient documentation

## 2023-09-16 DIAGNOSIS — M1611 Unilateral primary osteoarthritis, right hip: Secondary | ICD-10-CM | POA: Insufficient documentation

## 2023-09-16 DIAGNOSIS — Z01818 Encounter for other preprocedural examination: Secondary | ICD-10-CM | POA: Insufficient documentation

## 2023-09-16 DIAGNOSIS — Z6841 Body Mass Index (BMI) 40.0 and over, adult: Secondary | ICD-10-CM | POA: Insufficient documentation

## 2023-09-16 DIAGNOSIS — Z8614 Personal history of Methicillin resistant Staphylococcus aureus infection: Secondary | ICD-10-CM | POA: Insufficient documentation

## 2023-09-16 DIAGNOSIS — J449 Chronic obstructive pulmonary disease, unspecified: Secondary | ICD-10-CM | POA: Insufficient documentation

## 2023-09-16 LAB — CBC WITH DIFF
BASOPHIL #: 0.1 10*3/uL (ref 0.00–0.20)
BASOPHIL %: 1 % (ref 0–2)
EOSINOPHIL #: 0.2 10*3/uL (ref 0.00–0.60)
EOSINOPHIL %: 2 % (ref 0–5)
HCT: 40.5 % (ref 36.0–46.0)
HGB: 13.8 g/dL — ABNORMAL LOW (ref 13.9–16.3)
LYMPHOCYTE #: 1.6 10*3/uL (ref 1.10–3.80)
LYMPHOCYTE %: 19 % (ref 19–46)
MCH: 33.6 pg (ref 25.4–34.0)
MCHC: 34 g/dL (ref 30.0–37.0)
MCV: 98.9 fL (ref 80.0–100.0)
MONOCYTE #: 0.8 10*3/uL (ref 0.10–0.80)
MONOCYTE %: 10 % (ref 4–12)
MPV: 7.5 fL (ref 7.5–11.5)
NEUTROPHIL #: 5.5 10*3/uL (ref 1.80–7.50)
NEUTROPHIL %: 68 % (ref 41–69)
PLATELETS: 228 10*3/uL (ref 130–400)
RBC: 4.09 10*6/uL — ABNORMAL LOW (ref 4.30–5.90)
RDW: 15.2 % — ABNORMAL HIGH (ref 11.5–14.0)
WBC: 8.2 10*3/uL (ref 4.5–11.5)

## 2023-09-16 LAB — DRUG SCREEN, WITH CONFIRMATION, URINE
AMPHETAMINES URINE: NEGATIVE
BARBITURATES URINE: NEGATIVE
BENZODIAZEPINES URINE: NEGATIVE
BUPRENORPHINE URINE: POSITIVE — AB
CANNABINOIDS URINE: NEGATIVE
COCAINE METABOLITES URINE: NEGATIVE
FENTANYL, RANDOM URINE: NEGATIVE
METHADONE URINE: NEGATIVE
OPIATES URINE (LOW CUTOFF): NEGATIVE
OXYCODONE URINE: NEGATIVE
PCP URINE: NEGATIVE

## 2023-09-16 LAB — PT/INR
INR: 1.02 (ref 0.78–1.11)
PROTHROMBIN TIME: 11.5 s (ref 10.2–12.5)

## 2023-09-16 LAB — HGA1C (HEMOGLOBIN A1C WITH EST AVG GLUCOSE)
ESTIMATED AVERAGE GLUCOSE: 131 mg/dL
HEMOGLOBIN A1C: 6.2 % — ABNORMAL HIGH (ref 4.0–6.0)

## 2023-09-16 LAB — COMPREHENSIVE METABOLIC PANEL, NON-FASTING
ALBUMIN/GLOBULIN RATIO: 1.2 — ABNORMAL LOW (ref 1.5–2.5)
ALBUMIN: 4 g/dL (ref 3.5–5.0)
ALKALINE PHOSPHATASE: 171 U/L — ABNORMAL HIGH (ref 38–126)
ALT (SGPT): 93 U/L — ABNORMAL HIGH (ref <50)
ANION GAP: 5 mmol/L (ref 5–19)
AST (SGOT): 127 U/L — ABNORMAL HIGH (ref 17–59)
BILIRUBIN TOTAL: 0.8 mg/dL (ref 0.2–1.3)
BUN/CREA RATIO: 7 (ref 6–20)
BUN: 6 mg/dL — ABNORMAL LOW (ref 9–20)
CALCIUM: 9.6 mg/dL (ref 8.4–10.2)
CHLORIDE: 93 mmol/L — ABNORMAL LOW (ref 98–107)
CO2 TOTAL: 36 mmol/L — ABNORMAL HIGH (ref 22–30)
CREATININE: 0.83 mg/dL (ref 0.66–1.20)
ESTIMATED GFR: >60 mL/min/{1.73_m2} (ref >60)
GLUCOSE: 120 mg/dL — ABNORMAL HIGH (ref 74–106)
POTASSIUM: 4.8 mmol/L (ref 3.5–5.1)
PROTEIN TOTAL: 7.3 g/dL (ref 6.3–8.2)
SODIUM: 134 mmol/L — ABNORMAL LOW (ref 137–145)

## 2023-09-16 LAB — VITAMIN D 25 TOTAL: VITAMIN D: 50 ng/mL (ref 20–100)

## 2023-09-16 MED ORDER — BUPROPION HCL XL 150 MG 24 HR TABLET, EXTENDED RELEASE
150.0000 mg | ORAL_TABLET | Freq: Every day | ORAL | 3 refills | Status: AC
Start: 2023-09-16 — End: ?

## 2023-09-16 NOTE — Anesthesia Preprocedure Evaluation (Signed)
ANESTHESIA PRE-OP EVALUATION  Planned Procedure: RELEASE CARPAL TUNNEL (Left: Wrist)  Review of Systems     anesthesia history negative               Pulmonary   COPD, moderate, home oxygen (1-2 liters PRN) and past history of smoking ,   Cardiovascular      04/2023 MPS  Interpretation Summary    Clinically and electrocardiographically negative regadenoson conversion ECG portion of the study.    Post-stress ejection fraction was 44%.    2024 TTE  Conclusions:  The left ventricular ejection fraction by visual assessment is estimated to be 55-60%.  Mild left ventricular hypertrophy.  There is mild tricuspid regurgitation.   and hyperlipidemia , Exercise Tolerance: > or = 4 METS        GI/Hepatic/Renal    kidney stones        Endo/Other    osteoarthritis and obesity,      Neuro/Psych/MS  Chronic opioid use (suboxone chronically)  back abnormality (s/p lumbar spine surgery), Neck problems (s/p cervical spine surgery)     Cancer    negative hematology/oncology ROS,                   Physical Assessment      Airway       Mallampati: II    TM distance: 3 FB    Neck ROM: full  Mouth Opening: good.  Facial hair  Beard        Dental           (+) missing           Pulmonary    Breath sounds clear to auscultation  (-) no rhonchi, no decreased breath sounds, no wheezes, no rales and no stridor     Cardiovascular    Rhythm: regular  Rate: Normal       Other findings  Lower arch notable for periodontal disease, teeth were sore after last GA for back surgery and DL but not when [facility] was used          Plan  ASA 3     Planned anesthesia type: MAC                               Anesthetic plan and risks discussed with patient

## 2023-09-17 ENCOUNTER — Other Ambulatory Visit (HOSPITAL_BASED_OUTPATIENT_CLINIC_OR_DEPARTMENT_OTHER): Payer: Self-pay | Admitting: Internal Medicine

## 2023-09-17 ENCOUNTER — Encounter (HOSPITAL_BASED_OUTPATIENT_CLINIC_OR_DEPARTMENT_OTHER): Payer: Self-pay | Admitting: Internal Medicine

## 2023-09-17 LAB — URINE HOLD

## 2023-09-17 NOTE — OR PreOp (Addendum)
09-17-23: Patient had labs drawn in preop that were ordered by his PCP. CMP and drug screen reviewed by Dr. Standley Brooking. Okay for surgery. No orders. M. SabinskiRN    09-17-23: Message sent to ordering physician to review labs. M. SabinskiRN

## 2023-09-18 LAB — SUBOXONE CONFIRMATORY/DEFINITIVE, URINE, BY LC-MS/MS (PERFORMABLE)
BUPRENORPHINE: 39 ng/mL — ABNORMAL HIGH (ref <10)
NALOXONE: 50 ng/mL — ABNORMAL HIGH (ref <5)
NORBUPRENORPHINE: 112 ng/mL — ABNORMAL HIGH (ref <10)

## 2023-09-23 ENCOUNTER — Other Ambulatory Visit: Payer: Self-pay

## 2023-09-23 ENCOUNTER — Ambulatory Visit (HOSPITAL_COMMUNITY): Payer: MEDICAID

## 2023-09-23 ENCOUNTER — Ambulatory Visit (HOSPITAL_COMMUNITY): Payer: MEDICAID | Admitting: Certified Registered"

## 2023-09-23 ENCOUNTER — Ambulatory Visit
Admission: RE | Admit: 2023-09-23 | Discharge: 2023-09-23 | Disposition: A | Discharge status: Home / Self Care | Payer: MEDICAID | Source: Ambulatory Visit | Attending: Plastic and Reconstructive Surgery | Admitting: Plastic and Reconstructive Surgery

## 2023-09-23 ENCOUNTER — Encounter (HOSPITAL_COMMUNITY): Payer: MEDICAID | Admitting: Plastic and Reconstructive Surgery

## 2023-09-23 ENCOUNTER — Encounter (HOSPITAL_COMMUNITY)
Admission: RE | Disposition: A | Discharge status: Home / Self Care | Payer: Self-pay | Source: Ambulatory Visit | Attending: Plastic and Reconstructive Surgery

## 2023-09-23 DIAGNOSIS — G5602 Carpal tunnel syndrome, left upper limb: Secondary | ICD-10-CM | POA: Insufficient documentation

## 2023-09-23 DIAGNOSIS — M1990 Unspecified osteoarthritis, unspecified site: Secondary | ICD-10-CM | POA: Insufficient documentation

## 2023-09-23 DIAGNOSIS — M5416 Radiculopathy, lumbar region: Secondary | ICD-10-CM | POA: Insufficient documentation

## 2023-09-23 DIAGNOSIS — I071 Rheumatic tricuspid insufficiency: Secondary | ICD-10-CM | POA: Insufficient documentation

## 2023-09-23 DIAGNOSIS — Z6839 Body mass index (BMI) 39.0-39.9, adult: Secondary | ICD-10-CM | POA: Insufficient documentation

## 2023-09-23 DIAGNOSIS — Z87442 Personal history of urinary calculi: Secondary | ICD-10-CM | POA: Insufficient documentation

## 2023-09-23 DIAGNOSIS — Z9981 Dependence on supplemental oxygen: Secondary | ICD-10-CM | POA: Insufficient documentation

## 2023-09-23 DIAGNOSIS — E785 Hyperlipidemia, unspecified: Secondary | ICD-10-CM | POA: Insufficient documentation

## 2023-09-23 DIAGNOSIS — F17290 Nicotine dependence, other tobacco product, uncomplicated: Secondary | ICD-10-CM | POA: Insufficient documentation

## 2023-09-23 DIAGNOSIS — M4802 Spinal stenosis, cervical region: Secondary | ICD-10-CM | POA: Insufficient documentation

## 2023-09-23 DIAGNOSIS — J449 Chronic obstructive pulmonary disease, unspecified: Secondary | ICD-10-CM | POA: Insufficient documentation

## 2023-09-23 DIAGNOSIS — E669 Obesity, unspecified: Secondary | ICD-10-CM | POA: Insufficient documentation

## 2023-09-23 SURGERY — RELEASE CARPAL TUNNEL
Anesthesia: Monitor Anesthesia Care | Site: Wrist | Laterality: Left | Wound class: Clean Wound: Uninfected operative wounds in which no inflammation occurred

## 2023-09-23 MED ORDER — HYDROMORPHONE (PF) 0.5 MG/0.5 ML INJECTION SYRINGE
INJECTION | INTRAMUSCULAR | Status: AC
Start: 2023-09-23 — End: 2023-09-23
  Filled 2023-09-23: qty 0.5

## 2023-09-23 MED ORDER — MORPHINE 2 MG/ML INJECTION WRAPPER
2.0000 mg | INJECTION | INTRAMUSCULAR | Status: DC | PRN
Start: 2023-09-23 — End: 2023-09-23

## 2023-09-23 MED ORDER — LACTATED RINGERS INTRAVENOUS SOLUTION
INTRAVENOUS | Status: DC
Start: 2023-09-23 — End: 2023-09-23

## 2023-09-23 MED ORDER — FENTANYL (PF) 50 MCG/ML INJECTION SOLUTION
INTRAMUSCULAR | Status: AC
Start: 2023-09-23 — End: 2023-09-23
  Filled 2023-09-23: qty 2

## 2023-09-23 MED ORDER — SODIUM CHLORIDE 0.9 % (FLUSH) INJECTION SYRINGE
10.0000 mL | INJECTION | INTRAMUSCULAR | Status: DC | PRN
Start: 2023-09-23 — End: 2023-09-23

## 2023-09-23 MED ORDER — SODIUM CHLORIDE 0.9 % (FLUSH) INJECTION SYRINGE
10.0000 mL | INJECTION | Freq: Three times a day (TID) | INTRAMUSCULAR | Status: DC
Start: 2023-09-23 — End: 2023-09-23

## 2023-09-23 MED ORDER — LIDOCAINE-EPINEPHRINE 0.5 %-1:200,000 INJECTION SOLUTION
Freq: Once | INTRAMUSCULAR | Status: DC | PRN
Start: 2023-09-23 — End: 2023-09-23
  Administered 2023-09-23: 3 mL via INTRAMUSCULAR

## 2023-09-23 MED ORDER — DROPERIDOL 2.5 MG/ML INJECTION SOLUTION
0.6250 mg | Freq: Once | INTRAMUSCULAR | Status: DC | PRN
Start: 2023-09-23 — End: 2023-09-23

## 2023-09-23 MED ORDER — MIDAZOLAM 1 MG/ML INJECTION WRAPPER
1.0000 mg | Freq: Once | INTRAMUSCULAR | Status: DC | PRN
Start: 2023-09-23 — End: 2023-09-23

## 2023-09-23 MED ORDER — LABETALOL 5 MG/ML INTRAVENOUS SOLUTION
10.0000 mg | Freq: Once | INTRAVENOUS | Status: DC | PRN
Start: 2023-09-23 — End: 2023-09-23

## 2023-09-23 MED ORDER — MIDAZOLAM 1 MG/ML INJECTION WRAPPER
Freq: Once | INTRAMUSCULAR | Status: DC | PRN
Start: 2023-09-23 — End: 2023-09-23
  Administered 2023-09-23: 2 mg via INTRAVENOUS

## 2023-09-23 MED ORDER — MEPERIDINE (PF) 25 MG/ML INJECTION SYRINGE
12.5000 mg | INJECTION | INTRAMUSCULAR | Status: DC | PRN
Start: 2023-09-23 — End: 2023-09-23

## 2023-09-23 MED ORDER — FENTANYL (PF) 50 MCG/ML INJECTION SOLUTION
Freq: Once | INTRAMUSCULAR | Status: DC | PRN
Start: 2023-09-23 — End: 2023-09-23
  Administered 2023-09-23 (×2): 25 ug via INTRAVENOUS
  Administered 2023-09-23: 50 ug via INTRAVENOUS

## 2023-09-23 MED ORDER — LIDOCAINE (PF) 100 MG/5 ML (2 %) INTRAVENOUS SYRINGE
INJECTION | Freq: Once | INTRAVENOUS | Status: DC | PRN
Start: 2023-09-23 — End: 2023-09-23
  Administered 2023-09-23: 100 mg via INTRAVENOUS

## 2023-09-23 MED ORDER — HYDROMORPHONE (PF) 0.5 MG/0.5 ML INJECTION SYRINGE
0.5000 mg | INJECTION | INTRAMUSCULAR | Status: DC | PRN
Start: 2023-09-23 — End: 2023-09-23
  Administered 2023-09-23: 0.5 mg via INTRAVENOUS

## 2023-09-23 MED ORDER — PROPOFOL 10 MG/ML INTRAVENOUS EMULSION
INTRAVENOUS | Status: DC | PRN
Start: 2023-09-23 — End: 2023-09-23
  Administered 2023-09-23: 0 ug/kg/min via INTRAVENOUS
  Administered 2023-09-23: 150 ug/kg/min via INTRAVENOUS

## 2023-09-23 MED ORDER — HYDRALAZINE 20 MG/ML INJECTION SOLUTION
10.0000 mg | Freq: Once | INTRAMUSCULAR | Status: DC | PRN
Start: 2023-09-23 — End: 2023-09-23

## 2023-09-23 MED ORDER — GLYCOPYRROLATE 0.2 MG/ML INJECTION SOLUTION
Freq: Once | INTRAMUSCULAR | Status: DC | PRN
Start: 2023-09-23 — End: 2023-09-23
  Administered 2023-09-23: .2 mg via INTRAVENOUS

## 2023-09-23 MED ORDER — MIDAZOLAM 1 MG/ML INJECTION WRAPPER
INTRAMUSCULAR | Status: AC
Start: 2023-09-23 — End: 2023-09-23
  Filled 2023-09-23: qty 2

## 2023-09-23 MED ORDER — PROPOFOL 10 MG/ML IV BOLUS
INJECTION | Freq: Once | INTRAVENOUS | Status: DC | PRN
Start: 2023-09-23 — End: 2023-09-23
  Administered 2023-09-23: 50 mg via INTRAVENOUS

## 2023-09-23 MED ORDER — HYDROMORPHONE 1 MG/ML INJECTION WRAPPER
1.0000 mg | INJECTION | INTRAMUSCULAR | Status: DC | PRN
Start: 2023-09-23 — End: 2023-09-23

## 2023-09-23 MED ORDER — LIDOCAINE-EPINEPHRINE 0.5 %-1:200,000 INJECTION SOLUTION
INTRAMUSCULAR | Status: AC
Start: 2023-09-23 — End: 2023-09-23
  Filled 2023-09-23: qty 50

## 2023-09-23 SURGICAL SUPPLY — 14 items
APPL 70% ISPRP 2% CHG 26ML CHLRPRP HI-LT ORNG PREP STRL LF  DISP CLR (MED SURG SUPPLIES) ×1 IMPLANT
BANDAGE 4.1YDX4.5IN 6 PLY HYPOALL COTTON LRG GAUZE WHT STRL LF  DISP (WOUND CARE SUPPLY) ×1 IMPLANT
BANDAGE ACE 4.5YDX4IN NONST ELAS STRCH REINF LTWT PLSTR COTTON COMPRESS BGE WHT LF (WOUND CARE SUPPLY) ×1 IMPLANT
BANDAGE ESMARK 9FTX4IN STRL SYN COMPRESS LF (WOUND CARE SUPPLY) ×1 IMPLANT
DRESS PETRO 8X1IN CURAD XR COTTON GAUZE NADH OCL IMPREGNATE LF  STRL WHT (WOUND CARE SUPPLY) ×1 IMPLANT
ELECTRODE PATIENT RTN VLAB RM PHSV ACRL FOAM CRDLS NONIRRITATE NONSENSITIZE ADH STRP ADULT RECQM (SURGICAL CUTTING SUPPLIES) ×1 IMPLANT
GOWN SURG 2XL STD LGTH L4 AERO CHRM CRW REG CUT HKLP STRL (DRAPE/PACKS/SHEETS/OR TOWEL) ×1 IMPLANT
NEEDLE HYPO  25GA 1.5IN REG WL PRCSNGL SS POLYPROP REG BVL LL HUB DEHP-FR BLU STRL LF  DISP (MED SURG SUPPLIES) ×2 IMPLANT
PADDING CAST 4YDX4IN SPCLST 100 COTTON COHESION HAND TEARABLE SLF BOND LF (ORTHOPEDICS (NOT IMPLANTS)) ×1 IMPLANT
SOL IRRG 0.9% NACL 1000ML PLASTIC PR BTL ISTNC N-PYRG STRL LF (MEDICATIONS/SOLUTIONS) ×1 IMPLANT
STRAP POSITION 60X4IN RING KNEE PLASTIC FOAM ADJ HKLP CLSR LF  DISP (MED SURG SUPPLIES) ×1 IMPLANT
SUTURE 4-0 PS2 ETHILON MTPS 19IN BLK MONOF NONAB (SUTURE/WOUND CLOSURE) ×1 IMPLANT
SYRINGE 2OZ STRL BULB PEEL PCH EAR ULCR (MED SURG SUPPLIES) ×1 IMPLANT
TRAY PLASTIC BASIC CUSTOM - ~~LOC~~ HOSP (CUSTOM TRAYS & PACK) ×1 IMPLANT

## 2023-09-23 NOTE — Anesthesia Postprocedure Evaluation (Signed)
Anesthesia Post Op Evaluation    Patient: [name] [name]  Procedure(s):  RELEASE CARPAL TUNNEL    Last Vitals:Temperature: 36.8 C (98.2 F) (09/23/23 0915)  Heart Rate: (!) 117 (09/23/23 0935)  BP (Non-Invasive): 130/82 (09/23/23 0930)  Respiratory Rate: 12 (09/23/23 0935)  SpO2: 91 % (09/23/23 0935)    No notable events documented.    Patient is sufficiently recovered from the effects of anesthesia to participate in the evaluation and has returned to their pre-procedure level.  Patient location during evaluation: PACU       Patient participation: complete - patient participated  Level of consciousness: awake and alert and responsive to verbal stimuli    Pain management: adequate  Airway patency: patent    Anesthetic complications: no  Cardiovascular status: acceptable  Respiratory status: acceptable  Hydration status: acceptable  Patient post-procedure temperature: Pt Normothermic   PONV Status: Absent

## 2023-09-23 NOTE — Anesthesia Transfer of Care (Signed)
ANESTHESIA TRANSFER OF CARE   [name] [name] is a 57 y.o. ,male, Weight: (!) 139 kg (307 lb)   had Procedure(s):  RELEASE CARPAL TUNNEL  performed  09/23/23   Primary Service: Ross Marcus, MD    Past Medical History:   Diagnosis Date   . Cervical stenosis of spinal canal    . Chronic gum disease     RISK OF TEETH FALLING OUT   . Chronic obstructive pulmonary disease (CMS HCC) 04/29/2023   . Chronic pain    . COPD (chronic obstructive pulmonary disease) (CMS HCC)    . CVD (cerebrovascular disease)    . Exertional dyspnea 04/29/2023   . Gait disturbance    . Gout    . History of anesthesia complications     PATIENT STATED TEETH AS RISK OF BEING KNOCKED OUT DUE TO GUM DISEASE   . History of substance use 04/29/2023   . Hyperlipidemia    . Kidney stones    . Laceration of leg 02/2023   . Leg weakness, bilateral    . Low back pain    . Lumbar radiculopathy    . MRSA infection 2013    toe surgery   . Neck pain    . Neck problem    . Oxygen dependent    . Wears glasses       Allergy History as of 09/23/23       NAPROXEN         Noted Status Severity Type Reaction    06/21/23 0944 Markham Jordan, RN 06/11/22 Active Low Intolerance Nausea/ Vomiting    06/11/22 1117 Ward, [place], [place] 06/11/22 Active Low  Nausea/ Vomiting                  I completed my transfer of care / handoff to the receiving personnel during which we discussed:  Access, Airway, All key/critical aspects of case discussed, Analgesia, Antibiotics, Expectation of post procedure, Fluids/Product, Gave opportunity for questions and acknowledgement of understanding, Labs and PMHx      Post Location: PACU                                                           Last OR Temp: Temperature: 36.8 C (98.2 F)  ABG:  PH (ARTERIAL)   Date Value Ref Range Status   02/18/2023 7.25 (L) 7.35 - 7.45 Final     PCO2 (ARTERIAL)   Date Value Ref Range Status   02/18/2023 35 35 - 45 mm/Hg Final     PCO2 (VENOUS)   Date Value Ref Range Status   02/15/2023 60 (H) 41 - 51  mm/Hg Final     PO2 (ARTERIAL)   Date Value Ref Range Status   02/18/2023 72 (L) 83 - 108 mm/Hg Final     PO2 (VENOUS)   Date Value Ref Range Status   02/15/2023 51 mm/Hg Final     Comment:     No ranges were established for venous pO2 as manufacturer does not recommend venous sample for this test.     SODIUM   Date Value Ref Range Status   02/18/2023 133 (L) 136 - 145 mmol/L Final     POTASSIUM   Date Value Ref Range Status   09/16/2023 4.8 3.5 - 5.1 mmol/L Final  05/17/2023 4.7 3.5 - 5.1 mmol/L Final     KETONES   Date Value Ref Range Status   02/15/2023 Negative Negative mg/dL Final     WHOLE BLOOD POTASSIUM   Date Value Ref Range Status   02/18/2023 4.3 3.5 - 5.1 mmol/L Final     CHLORIDE   Date Value Ref Range Status   02/18/2023 101 98 - 107 mmol/L Final     CALCIUM   Date Value Ref Range Status   09/16/2023 9.6 8.4 - 10.2 mg/dL Final   11/91/4782 8.7 8.6 - 10.2 mg/dL Final     Comment:     Gadolinium-containing contrast can interfere with calcium measurement.       CALCIUM OXALATE CRYSTALS   Date Value Ref Range Status   12/05/2022 Moderate (A) None, Slight /hpf Final     Calculated P Axis   Date Value Ref Range Status   05/06/2023 76 degrees Final     Calculated R Axis   Date Value Ref Range Status   05/06/2023 37 degrees Final     Calculated T Axis   Date Value Ref Range Status   05/06/2023 40 degrees Final     IONIZED CALCIUM   Date Value Ref Range Status   02/18/2023 1.20 1.15 - 1.33 mmol/L Final     LACTATE   Date Value Ref Range Status   02/18/2023 0.6 <=1.9 mmol/L Final     HEMOGLOBIN   Date Value Ref Range Status   02/18/2023 16.7 12.0 - 18.0 g/dL Final     OXYHEMOGLOBIN   Date Value Ref Range Status   02/18/2023 97.7 (H) 90.0 - 95.0 % Final     CARBOXYHEMOGLOBIN   Date Value Ref Range Status   02/18/2023 1.3 <=3.0 % Final     MET-HEMOGLOBIN   Date Value Ref Range Status   02/18/2023 0.8 <=1.5 % Final     BASE EXCESS   Date Value Ref Range Status   02/15/2023 4.0 (H) 0.0 - 3.0 mmol/L Final     BASE  EXCESS (ARTERIAL)   Date Value Ref Range Status   02/18/2023 5.0 (H) 0.0 - 3.0 mmol/L Final     BENZODIAZEPINES COMMENTS   Date Value Ref Range Status   02/19/2023   Final     Comment:                                        See LDT message                                     Benzodiazepine Comment     BASE DEFICIT   Date Value Ref Range Status   02/18/2023 11.0 (H) 0.0 - 3.0 mmol/L Final     BICARBONATE (ARTERIAL)   Date Value Ref Range Status   02/18/2023 16.2 (L) 21.0 - 28.0 mmol/L Final     BICARBONATE (VENOUS)   Date Value Ref Range Status   02/15/2023 27.7 22.0 - 29.0 mmol/L Final     %FIO2 (VENOUS)   Date Value Ref Range Status   02/15/2023 44.0 % Final     Airway:* No LDAs found *  Blood pressure 106/68, pulse (!) 116, temperature 36.8 C (98.2 F), resp. rate 16, height 1.88 m (6\' 2" ), weight (!) 139 kg (307 lb), SpO2 92%.

## 2023-09-23 NOTE — H&P (Signed)
 [facility], Surgery Dept   [address]  [place] [address]  [phone]  History and Physical    Name: [name] [name]  MRN: [id number]  DOB:05/23/1966  Date:  09/23/2023      History of Present Illness: [name] [name] is a 57 y.o. male who presents for RELEASE CARPAL TUNNEL (LEFT: Wrist) today with Dr. Mickle Asper. Patient is s/p Open carpal tunnel release right wrist and Injection corticosteroid left wrist carpal tunnel on 06/24/23 with Dr. Mickle Asper. Please see my H&P on that date for full clinical history. Patient also has PSH for cervical spine operation and lumbar spine operation; please see notes in Epic. Patient currently complains of "numbness" involving his "entire" left hand". He complains of AM stiffness. He complains of nocturnal symptoms that wake him from sleep. He states the steroid injection and splints have not been helpful. He reports decreased grip strength and difficulty with fine motor movements. He denies other acute complaints.         OF NOTE: Per my H&P on that date, "Also, patient takes Suboxone which he purchases from the "black market". He reports he took approximately 1 mg at 3 am. This will be reported to Dr. Mickle Asper and anesthesia. "  Patient had a drug screen performed on 09/16/23 which was positive for BUPRENORPHINE; please see full report in Epic.     Also, patient has history of COPD. He denies using supplemental O2. On 06/24/23 his SpO2 was 89%. A Duoneb treatment was ordered to be given preoperatively. Patient denies current SOB. Today patient's SpO2 is 91%. Patient states he used an Albuterol treatment last evening. On exam, patient's breath sounds are diminished, but clear. Discussed findings with Dr. Standley Brooking with anesthesia and we have decided not to order a Duoneb preoperatively today.         Past Medical History:   Past Medical History:   Diagnosis Date    Cervical stenosis of spinal canal     Chronic gum disease     RISK OF TEETH FALLING OUT    Chronic obstructive pulmonary  disease (CMS HCC) 04/29/2023    Chronic pain     COPD (chronic obstructive pulmonary disease) (CMS HCC)     CVD (cerebrovascular disease)     Exertional dyspnea 04/29/2023    Gait disturbance     Gout     History of anesthesia complications     PATIENT STATED TEETH AS RISK OF BEING KNOCKED OUT DUE TO GUM DISEASE    History of substance use 04/29/2023    Hyperlipidemia     Kidney stones     Laceration of leg 02/2023    Leg weakness, bilateral     Low back pain     Lumbar radiculopathy     MRSA infection 2013    toe surgery    Neck pain     Neck problem     Oxygen dependent     Wears glasses        Past Surgical History:  Past Surgical History:   Procedure Laterality Date    HX CERVICAL SPINE SURGERY  02/18/2023    HX FOOT SURGERY Left     jont implant left toe    HX HERNIA REPAIR Left     inguinal    LITHOTRIPSY      LUMBAR DISC SURGERY  05/2023       Current Medications:  Current Outpatient Medications   Medication Instructions    acetaminophen (TYLENOL) 650 mg, Oral,  EVERY 6 HOURS    albuterol (PROVENTIL) 2.5 mg, Nebulization, 3 TIMES DAILY PRN    allopurinoL (ZYLOPRIM) 100 mg, Oral, 2 TIMES DAILY    buprenorphine HCl/naloxone HCl (SUBOXONE SL) 2 mg, Sublingual, DAILY, ASKED PATIENT WHAT THE MG WAS ON THIS MEDICATION AND He STATES THAT He DOES NOT HAVE A PRESCRIPTION FOR IT He BUYS IT OFF THE "BLACK MARKET"<BR>It's an 8mg  strip that he cuts into 8 pieces , uses 1-2 pieces at a time    buPROPion (WELLBUTRIN XL) 150 mg, Oral, DAILY    Ibuprofen (MOTRIN) 800 mg, Oral, 4 TIMES DAILY PRN, Last dose 11/8        Allergies:  Allergies   Allergen Reactions    Naproxen Nausea/ Vomiting       Social History:  Social History     Socioeconomic History    Marital status: Divorced   Tobacco Use    Smoking status: Former     Current packs/day: 1.00     Types: Cigarettes     Passive exposure: Past    Smokeless tobacco: Former    Tobacco comments:     Vapes daily    Vaping Use    Vaping status: Every Day    Substances: Nicotine    Substance and Sexual Activity    Alcohol use: Yes     Comment: twice weekly    Drug use: Not Currently     Comment: suboxone off black market    Sexual activity: Not Currently   Other Topics Concern    Ability to Walk 1 Flight of Steps without SOB/CP No    Ability to Walk 2 Flight of Steps without SOB/CP No    Ability To Do Own ADL's Yes     Social Determinants of Health     Social Connections: Low Risk  (09/16/2023)    Social Connections     SDOH Social Isolation: 5 or more times a week       Family History:  Family Medical History:       Problem Relation (Age of Onset)    COPD Mother    Seizures Sister             ROS  Review of Systems   Constitutional:  Negative for chills and fever.   HENT: Negative.     Respiratory:  Negative for cough and shortness of breath.    Cardiovascular:  Negative for chest pain and palpitations.   Gastrointestinal:  Negative for abdominal pain, nausea and vomiting.   Genitourinary: Negative.    Musculoskeletal:         See HPI   Skin:  Negative for rash.   Neurological:  Negative for dizziness and headaches.        See HPI        Vitals  Vitals:    09/23/23 0743   BP: (!) 149/92   Pulse: 100   Resp: 20   Temp: 36.1 C (97 F)   SpO2: 91%   Weight: (!) 139 kg (307 lb)   Height: 1.88 m (6\' 2" )   BMI: 39.42           Physical Exam  Physical Exam  Vitals reviewed.   Constitutional:       Appearance: Normal appearance. He is obese. He is diaphoretic.   HENT:      Mouth/Throat:      Mouth: Mucous membranes are moist.      Pharynx: Oropharynx is clear.   Eyes:  Pupils: Pupils are equal, round, and reactive to light.   Cardiovascular:      Rate and Rhythm: Normal rate and regular rhythm.      Pulses: Normal pulses.      Heart sounds: No murmur heard.  Pulmonary:      Effort: Pulmonary effort is normal.      Breath sounds: No wheezing or rhonchi.      Comments: DECREASED BREATH SOUNDS   Abdominal:      General: Bowel sounds are normal.      Palpations: Abdomen is soft.      Tenderness:  There is no abdominal tenderness.   Musculoskeletal:         General: Normal range of motion.      Right lower leg: No edema.      Left lower leg: No edema.      Comments: Cane for ambulation  Grips equal   Skin:     General: Skin is warm.      Findings: No rash.      Comments: LEFT HAND: warm to touch; interspaces are clean and dry; no lesions or wounds; no rash    Neurological:      Mental Status: He is alert and oriented to person, place, and time.      Comments: Subjectively reports sensation to light touch intact and equal to distal tips of UE digits b/l             Assessment:   57 y.o. 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 Mickle Asper.       Dorinda Hill, [place]

## 2023-09-23 NOTE — OR Surgeon (Signed)
 [facility]  OPERATIVE REPORT      Jocsan, Raup, 57 y.o. male  Date of Procedure:  09/23/23  Date of Birth:  01/13/1966  Service: Plastic Surgery  PCP:  Cecilie Kicks, DO    Procedure Performed:  Open carpal tunnel release left wrist    Pre-Operative Diagnosis: Pre-Op Diagnosis Codes:      * Carpal tunnel syndrome on left [G56.02]     Post-Operative Diagnosis: refer to post op md report     Pertinent Operative Findings:  None     Attending Surgeon: Ross Marcus, MD     Assistant(s): NA    Anesthesia Type: Monitor Anesthesia Care    Estimated Blood Loss:  25 cc    Complications (not routinely expected or not inherent to difficulty/nature of procedure):  None    Drains:  None    Specimens/ Cultures:  None    Indications:  Patient presented to the office complaining of bilateral carpal tunnel syndrome.  In the past he has had release of the right carpal tunnel.  He has also had injections to the left.  This has not been successful.  Risks benefits and treatment alternatives for open carpal tunnel release with left wrist were reviewed with the patient accepted and requested we proceed as indicated below           Description of Procedure:     The patient was brought to the operating room and placed in the supine position.  Following appropriate anesthesia, the patient was prepped and draped in normal sterile fashion.  The patient was identified in the preoperative time-out for the appropriate procedure.  The arm was exsanguinated and tourniquet inflated.  Local anesthesia was instilled along the planned incision in the distal palmar crease.  At the distal wrist a curvilinear incision made in the intrathenar base to the distal wrist crease where a 90 degree back cut was performed.  Skin flap was elevated and the palmaris longus tendon identified.  Further dissection was carried ulnar to this structure.  Volar carpal ligament identified.  Ligament divided and division carried proximally and distally for a  proximally 1 cm each direction.  Contents of the canal were next explored.  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  No other musculotendinous abnormalities.  Bony arc palpated felt to be normal architecture.  Median nerve evaluated found hourglass deformity approximately 10-15% cross-sectional diameter some reactive hyperemia.  Motor branch visualized entering the base of the thenar eminence.  Tourniquet was released and hemostasis achieved where appropriate using electrocautery.  Wounds were closed with interrupted simple and mattress sutures of 4-0 black nylon.  Xeroform sterile gauze dressings were applied.  Patient was now awakened taken to recovery room for eventual discharge to home.  Patient received instructions regarding care of wounds.  Patient advised no antibiotics generally necessary for the procedure.  Patient given prescription for pain medication.  Patient given follow-up appointment.  No change to home medication profile or diet occurred as result of surgical procedure     Ross Marcus, MD

## 2023-09-26 ENCOUNTER — Ambulatory Visit (INDEPENDENT_AMBULATORY_CARE_PROVIDER_SITE_OTHER): Payer: MEDICAID | Admitting: Plastic and Reconstructive Surgery

## 2023-09-26 ENCOUNTER — Encounter (INDEPENDENT_AMBULATORY_CARE_PROVIDER_SITE_OTHER): Payer: Self-pay | Admitting: Plastic and Reconstructive Surgery

## 2023-09-26 ENCOUNTER — Other Ambulatory Visit: Payer: Self-pay

## 2023-09-26 DIAGNOSIS — Z4889 Encounter for other specified surgical aftercare: Secondary | ICD-10-CM

## 2023-09-26 DIAGNOSIS — Z4801 Encounter for change or removal of surgical wound dressing: Secondary | ICD-10-CM

## 2023-09-26 NOTE — Progress Notes (Signed)
 PLASTIC SURGERY, [facility]  [address]  [place] [address]       Name: [name] [name] MRN:  [id number]   Date: 09/26/2023 Age: 57 y.o.        This visit note created in whole or part using voice recognition software.  Some inadvertent misspellings and/or word substitutions may occur despite proofreading.  Such errors, when present should not be misconstrued by any 3rd party to attempt to alter the true facts of the visit.  Alteration, whether verbal or written, intentional, unintentional or implied, may carry penalties under [address] or [employer].  Proper interpretation of any such materials will remain the sole prerogative of the author of the note.      Medical/Surgical history reviewed.    Allergies reviewed.    Medication profile reviewed.      Subjective:         Patient presents for first post op visit  No problem with wound healing    Objective:    Incision intact with no evidence of erythema or infection  Light touch sensation present at MCP joint flexor surface of index and long  Range of motion decreased likely secondary to underlying osteoarthritic conditions of the hand only limiting some joint range motion      Assessment:        Satisfactory postoperative visit  Maneuvers of surgery discussed with patient  Gentle range of motion and home exercise program discussed today    Plan:  As a plastic surgery specialist, I am only assessing the problems applicable and will leave medical problems to the attending physician.   As patient had some minor difficulty with his incision from the right carpal tunnel, we will delay suture removal next week and have his sutures removed on 10/07/2023.  I will see him back 2-3 weeks thereafter for review of healing and consideration of therapy    Ross Marcus, MD  09/26/2023 11:28

## 2023-09-30 ENCOUNTER — Telehealth (HOSPITAL_BASED_OUTPATIENT_CLINIC_OR_DEPARTMENT_OTHER): Payer: Self-pay

## 2023-09-30 ENCOUNTER — Encounter (HOSPITAL_BASED_OUTPATIENT_CLINIC_OR_DEPARTMENT_OTHER): Payer: Self-pay | Admitting: Internal Medicine

## 2023-09-30 NOTE — Telephone Encounter (Signed)
Pt to call for a dental appt, pt is cutting back on nicotine. Pt wgt is 307 lbs has lost 5 lbs goal of 290lbs. Will call pt in 2 weeks to check on the progress

## 2023-10-04 NOTE — Progress Notes (Incomplete)
PLASTIC SURGERY  [address]  [place] [address]  Operated by [facility]     Name: [name] [name] MRN:  [id number]   Date: 10/07/2023 Age: 57 y.o.     Chief Complaint:   No chief complaint on file.      History of Presenting Illness   [name] [name] is a 57 y.o. male presenting to the outpatient Plastic surgery Clinic for post operative follow up s/p open carpal tunnel release of the left wrist with Dr. Mickle Asper. Patient seen in the outpatient office for suture removal. Patient seen post operatively by Dr. Mickle Asper on 09/26/2023. Patient voiced no issues with wound healing. Patient had intact sensation at the MCP flexor surface of the index and long. Patient today endorses feeling well overall. Denies fever, chills. Denies new onset numbness or tingling. Denies issues with wound healing. Denies other acute concerns at this time. Patient voices that sutures are ready to be removed at this time. ***     Review of Systems:  Constitutional: no fever  HEENT: no vision problem  Respiratory: no shortness of breath  Cardiovascular: no chest pain  Gastrointestinal: no nausea  Musculoskeletal: no weakness  Neurological: no headaches  Psych: no hallucinations  All other ROS Negative    Past Medical History  I have reviewed and updated as appropriate the past medical, surgical, family, and social history today:  Current Outpatient Medications   Medication Sig    acetaminophen (TYLENOL) 325 mg Oral Tablet Take 2 Tablets (650 mg total) by mouth Every 6 hours (Patient taking differently: Take 2 Tablets (650 mg total) by mouth Every 6 hours as needed)    albuterol (PROVENTIL) 2.5 mg/0.5 mL Inhalation Solution for Nebulization Take 0.5 mL (2.5 mg total) by nebulization Three times a day as needed    allopurinoL (ZYLOPRIM) 100 mg Oral Tablet TAKE 1 TABLET BY MOUTH 2 TIMES A DAY    buprenorphine HCl/naloxone HCl (SUBOXONE SL) Place 2 mg under the tongue Once a day ASKED PATIENT WHAT THE MG WAS ON THIS MEDICATION AND He  STATES THAT He DOES NOT HAVE A PRESCRIPTION FOR IT He BUYS IT OFF THE "BLACK MARKET"  It's an 8mg  strip that he cuts into 8 pieces , uses 1-2 pieces at a time    buPROPion (WELLBUTRIN XL) 150 mg extended release 24 hr tablet Take 1 Tablet (150 mg total) by mouth Once a day Indications: stop smoking    Ibuprofen (MOTRIN) 200 mg Oral Tablet Take 4 Tablets (800 mg total) by mouth Four times a day as needed for Pain Last dose 11/8     Allergies   Allergen Reactions    Naproxen Nausea/ Vomiting     Past Medical History:   Diagnosis Date    Cervical stenosis of spinal canal     Chronic gum disease     RISK OF TEETH FALLING OUT    Chronic obstructive pulmonary disease (CMS HCC) 04/29/2023    Chronic pain     COPD (chronic obstructive pulmonary disease) (CMS HCC)     CVD (cerebrovascular disease)     Exertional dyspnea 04/29/2023    Gait disturbance     Gout     History of anesthesia complications     PATIENT STATED TEETH AS RISK OF BEING KNOCKED OUT DUE TO GUM DISEASE    History of substance use 04/29/2023    Hyperlipidemia     Kidney stones     Laceration of leg 02/2023  Leg weakness, bilateral     Low back pain     Lumbar radiculopathy     MRSA infection 2013    toe surgery    Neck pain     Neck problem     Oxygen dependent     Wears glasses          Past Surgical History:   Procedure Laterality Date    HX CERVICAL SPINE SURGERY  02/18/2023    HX FOOT SURGERY Left     jont implant left toe    HX HERNIA REPAIR Left     inguinal    LITHOTRIPSY      LUMBAR DISC SURGERY  05/2023         Family Medical History:       Problem Relation (Age of Onset)    COPD Mother    Seizures Sister            Social History     Socioeconomic History    Marital status: Divorced   Tobacco Use    Smoking status: Former     Current packs/day: 1.00     Types: Cigarettes     Passive exposure: Past    Smokeless tobacco: Former    Tobacco comments:     Vapes daily    Vaping Use    Vaping status: Every Day    Substances: Nicotine   Substance and  Sexual Activity    Alcohol use: Yes     Comment: twice weekly    Drug use: Not Currently     Comment: suboxone off black market    Sexual activity: Not Currently   Other Topics Concern    Ability to Walk 1 Flight of Steps without SOB/CP No    Ability to Walk 2 Flight of Steps without SOB/CP No    Ability To Do Own ADL's Yes     Social Determinants of Health     Social Connections: Low Risk  (09/16/2023)    Social Connections     SDOH Social Isolation: 5 or more times a week        Physical Examination  General: In no acute distress.  Head: Normocephalic, atraumatic.  Neck: Trachea mid-line. Phonating in complete sentences with no distress.   Eyes: Conjunctiva clear. EOM intact.   Ears: Normal external ears.  Oral cavity:  Normal lips.   Respiratory: Unlabored breathing. No stridor.   Cardiovascular: No swelling/edema of exposed extremities.  Musculoskeletal: Moving all 4 extremities.   Lymphatic: No cervical lymphadenopathy.  Neuro: Cranial nerves grossly non-focal.   Skin: No jaundice.     ***  On evaluation of patient's left wrist, sutures are seen. Incision is ***     Diagnosis  ***    Plan     ***  Plan for patient to begin with soft tissue scar massage with cocoa butter. Patient is encouraged to continue with normal use of the hand/hand therapy exercises. Patient is to continue with OTC measures as needed for pain and discomfort. Patient encouraged to follow up with attending physician in 2-3 weeks. Appointment scheduled. Patient to follow up PRN in interim.     Patient is to continue medications as prescribed by PCP and other specialists.    Patient verbalized understanding and agreement with the above medical decision-making and plan.  All questions answered to patient's satisfaction. At time of disposition and appointment completion, patient left the outpatient office in stable condition.     ***sig***  Note was written by the Physician Assistant with Physician co signature.      I, Alphonse Guild, PA-C,  independent of the facility provider and attending physician, on the day of the encounter, a total of  *** minutes was spent on this patient encounter including review of historical information, examination, documentation and post-visit activities. The time documented excludes procedural time.  This note was partially created using voice recognition software and is inherently subject to errors including those of syntax and "sound alike " substitutions which may escape proof reading. In such instances, original meaning may be extrapolated by contextual derivation.

## 2023-10-05 ENCOUNTER — Other Ambulatory Visit (INDEPENDENT_AMBULATORY_CARE_PROVIDER_SITE_OTHER): Payer: Self-pay | Admitting: Family Medicine

## 2023-10-07 ENCOUNTER — Ambulatory Visit (HOSPITAL_BASED_OUTPATIENT_CLINIC_OR_DEPARTMENT_OTHER): Payer: Self-pay | Admitting: PHYSICIAN ASSISTANT

## 2023-10-07 ENCOUNTER — Other Ambulatory Visit (INDEPENDENT_AMBULATORY_CARE_PROVIDER_SITE_OTHER): Payer: Self-pay | Admitting: Family Medicine

## 2023-10-07 DIAGNOSIS — Z029 Encounter for administrative examinations, unspecified: Secondary | ICD-10-CM

## 2023-10-07 MED ORDER — ALLOPURINOL 100 MG TABLET
100.0000 mg | ORAL_TABLET | Freq: Two times a day (BID) | ORAL | 1 refills | Status: DC
Start: 2023-10-07 — End: 2023-12-30

## 2023-10-07 NOTE — Nursing Note (Signed)
Patient called the office, he was to come in for suture removal today. He canceled appointment he has Covid symptoms and is going to his doctor for testing.  He states his daughter is a PA and he will have her remove the sutures.  I told him we do not recommend this, but he said " she had 7 years of school and she is capable of doing it."  I suggested they call if there are any issues.

## 2023-10-10 ENCOUNTER — Inpatient Hospital Stay
Admission: EM | Admit: 2023-10-10 | Discharge: 2023-10-14 | DRG: 871 | Disposition: A | Discharge status: Home / Self Care | Payer: MEDICAID | Attending: Family Medicine | Admitting: Family Medicine

## 2023-10-10 ENCOUNTER — Inpatient Hospital Stay (HOSPITAL_COMMUNITY): Payer: MEDICAID | Admitting: Family Medicine

## 2023-10-10 ENCOUNTER — Emergency Department (HOSPITAL_COMMUNITY)
Admission: RE | Admit: 2023-10-10 | Discharge: 2023-10-10 | Disposition: A | Discharge status: Home / Self Care | Payer: MEDICAID | Source: Ambulatory Visit

## 2023-10-10 ENCOUNTER — Inpatient Hospital Stay (HOSPITAL_COMMUNITY): Payer: MEDICAID

## 2023-10-10 ENCOUNTER — Emergency Department (HOSPITAL_COMMUNITY): Payer: MEDICAID

## 2023-10-10 ENCOUNTER — Encounter (HOSPITAL_COMMUNITY): Payer: Self-pay

## 2023-10-10 ENCOUNTER — Other Ambulatory Visit: Payer: Self-pay

## 2023-10-10 DIAGNOSIS — Z87898 Personal history of other specified conditions: Secondary | ICD-10-CM | POA: Diagnosis present

## 2023-10-10 DIAGNOSIS — R0902 Hypoxemia: Secondary | ICD-10-CM

## 2023-10-10 DIAGNOSIS — F1090 Alcohol use, unspecified, uncomplicated: Secondary | ICD-10-CM

## 2023-10-10 DIAGNOSIS — R059 Cough, unspecified: Secondary | ICD-10-CM

## 2023-10-10 DIAGNOSIS — A419 Sepsis, unspecified organism: Principal | ICD-10-CM | POA: Insufficient documentation

## 2023-10-10 DIAGNOSIS — R911 Solitary pulmonary nodule: Secondary | ICD-10-CM

## 2023-10-10 DIAGNOSIS — Z87891 Personal history of nicotine dependence: Secondary | ICD-10-CM

## 2023-10-10 DIAGNOSIS — K760 Fatty (change of) liver, not elsewhere classified: Secondary | ICD-10-CM | POA: Diagnosis present

## 2023-10-10 DIAGNOSIS — R609 Edema, unspecified: Secondary | ICD-10-CM

## 2023-10-10 DIAGNOSIS — J440 Chronic obstructive pulmonary disease with acute lower respiratory infection: Secondary | ICD-10-CM | POA: Diagnosis present

## 2023-10-10 DIAGNOSIS — R058 Other specified cough: Secondary | ICD-10-CM

## 2023-10-10 DIAGNOSIS — R9389 Abnormal findings on diagnostic imaging of other specified body structures: Secondary | ICD-10-CM

## 2023-10-10 DIAGNOSIS — R7401 Elevation of levels of liver transaminase levels: Secondary | ICD-10-CM

## 2023-10-10 DIAGNOSIS — J189 Pneumonia, unspecified organism: Secondary | ICD-10-CM | POA: Diagnosis present

## 2023-10-10 DIAGNOSIS — J449 Chronic obstructive pulmonary disease, unspecified: Secondary | ICD-10-CM | POA: Diagnosis present

## 2023-10-10 DIAGNOSIS — J159 Unspecified bacterial pneumonia: Secondary | ICD-10-CM | POA: Diagnosis present

## 2023-10-10 DIAGNOSIS — F17290 Nicotine dependence, other tobacco product, uncomplicated: Secondary | ICD-10-CM | POA: Diagnosis present

## 2023-10-10 DIAGNOSIS — F1010 Alcohol abuse, uncomplicated: Secondary | ICD-10-CM

## 2023-10-10 DIAGNOSIS — D6489 Other specified anemias: Secondary | ICD-10-CM

## 2023-10-10 DIAGNOSIS — R0602 Shortness of breath: Principal | ICD-10-CM

## 2023-10-10 DIAGNOSIS — G8929 Other chronic pain: Secondary | ICD-10-CM | POA: Diagnosis present

## 2023-10-10 DIAGNOSIS — E872 Acidosis, unspecified: Secondary | ICD-10-CM | POA: Diagnosis present

## 2023-10-10 DIAGNOSIS — F39 Unspecified mood [affective] disorder: Secondary | ICD-10-CM | POA: Diagnosis present

## 2023-10-10 DIAGNOSIS — F10139 Alcohol abuse with withdrawal, unspecified: Secondary | ICD-10-CM | POA: Diagnosis present

## 2023-10-10 DIAGNOSIS — F1110 Opioid abuse, uncomplicated: Secondary | ICD-10-CM | POA: Diagnosis present

## 2023-10-10 DIAGNOSIS — Z79899 Other long term (current) drug therapy: Secondary | ICD-10-CM

## 2023-10-10 DIAGNOSIS — K862 Cyst of pancreas: Secondary | ICD-10-CM | POA: Diagnosis present

## 2023-10-10 DIAGNOSIS — A4902 Methicillin resistant Staphylococcus aureus infection, unspecified site: Secondary | ICD-10-CM

## 2023-10-10 DIAGNOSIS — J441 Chronic obstructive pulmonary disease with (acute) exacerbation: Secondary | ICD-10-CM | POA: Diagnosis present

## 2023-10-10 DIAGNOSIS — J9621 Acute and chronic respiratory failure with hypoxia: Secondary | ICD-10-CM | POA: Diagnosis present

## 2023-10-10 DIAGNOSIS — Z720 Tobacco use: Secondary | ICD-10-CM | POA: Diagnosis present

## 2023-10-10 DIAGNOSIS — J9601 Acute respiratory failure with hypoxia: Secondary | ICD-10-CM | POA: Insufficient documentation

## 2023-10-10 DIAGNOSIS — I10 Essential (primary) hypertension: Secondary | ICD-10-CM | POA: Diagnosis present

## 2023-10-10 DIAGNOSIS — R6520 Severe sepsis without septic shock: Secondary | ICD-10-CM | POA: Diagnosis present

## 2023-10-10 DIAGNOSIS — D539 Nutritional anemia, unspecified: Secondary | ICD-10-CM

## 2023-10-10 HISTORY — DX: Methicillin resistant Staphylococcus aureus infection, unspecified site: A49.02

## 2023-10-10 LAB — LACTIC ACID LEVEL W/ REFLEX FOR LEVEL >2.0
LACTIC ACID: 4.1 mmol/L (ref 0.7–2.0)
LACTIC ACID: 3.4 mmol/L — ABNORMAL HIGH (ref 0.7–2.0)
LACTIC ACID: 3.3 mmol/L — ABNORMAL HIGH (ref 0.7–2.0)

## 2023-10-10 LAB — CBC WITH DIFF
BASOPHIL #: 0 10*3/uL (ref 0.00–0.20)
BASOPHIL %: 0 % (ref 0–2)
EOSINOPHIL #: 0 10*3/uL (ref 0.00–0.60)
EOSINOPHIL %: 0 % (ref 0–5)
HCT: 43.7 % (ref 36.0–46.0)
HGB: 14.5 g/dL (ref 13.9–16.3)
LYMPHOCYTE #: 1.4 10*3/uL (ref 1.10–3.80)
LYMPHOCYTE %: 16 % — ABNORMAL LOW (ref 19–46)
MCH: 34.4 pg — ABNORMAL HIGH (ref 25.4–34.0)
MCHC: 33.2 g/dL (ref 30.0–37.0)
MCV: 103.6 fL — ABNORMAL HIGH (ref 80.0–100.0)
MONOCYTE #: 1.3 10*3/uL — ABNORMAL HIGH (ref 0.10–0.80)
MONOCYTE %: 15 % — ABNORMAL HIGH (ref 4–12)
MPV: 7.3 fL — ABNORMAL LOW (ref 7.5–11.5)
NEUTROPHIL #: 6.1 10*3/uL (ref 1.80–7.50)
NEUTROPHIL %: 69 % (ref 41–69)
PLATELETS: 273 10*3/uL (ref 130–400)
RBC: 4.22 10*6/uL — ABNORMAL LOW (ref 4.30–5.90)
RDW: 16.4 % — ABNORMAL HIGH (ref 11.5–14.0)
WBC: 8.8 10*3/uL (ref 4.5–11.5)

## 2023-10-10 LAB — COMPREHENSIVE METABOLIC PANEL, NON-FASTING
ALBUMIN/GLOBULIN RATIO: 1.2 — ABNORMAL LOW (ref 1.5–2.5)
ALBUMIN: 4.3 g/dL (ref 3.5–5.0)
ALKALINE PHOSPHATASE: 121 U/L (ref 38–126)
ALT (SGPT): 77 U/L — ABNORMAL HIGH (ref <50)
ANION GAP: 9 mmol/L (ref 5–19)
AST (SGOT): 104 U/L — ABNORMAL HIGH (ref 17–59)
BILIRUBIN TOTAL: 1.2 mg/dL (ref 0.2–1.3)
BUN: <2 mg/dL — ABNORMAL LOW (ref 9–20)
CALCIUM: 9.7 mg/dL (ref 8.4–10.2)
CHLORIDE: 97 mmol/L — ABNORMAL LOW (ref 98–107)
CO2 TOTAL: 31 mmol/L — ABNORMAL HIGH (ref 22–30)
CREATININE: 0.65 mg/dL — ABNORMAL LOW (ref 0.66–1.20)
ESTIMATED GFR: >60 mL/min/{1.73_m2} (ref >60)
GLUCOSE: 127 mg/dL — ABNORMAL HIGH (ref 74–106)
POTASSIUM: 5 mmol/L (ref 3.5–5.1)
PROTEIN TOTAL: 8 g/dL (ref 6.3–8.2)
SODIUM: 137 mmol/L (ref 137–145)

## 2023-10-10 LAB — MRSA SCREEN, PCR, RAPID: MRSA COLONIZATION SCREEN: POSITIVE — AB

## 2023-10-10 LAB — COVID-19, FLU A/B, RSV RAPID BY PCR - LAB USE ONLY
INFLUENZA VIRUS TYPE A: NOT DETECTED
INFLUENZA VIRUS TYPE B: NOT DETECTED
RESPIRATORY SYNCTIAL VIRUS (RSV): NOT DETECTED
SARS-CoV-2: NOT DETECTED

## 2023-10-10 LAB — BLOOD GAS W/ LACTATE REFLEX
%FIO2 (VENOUS): 32 %
BASE EXCESS: 5.2 mmol/L — ABNORMAL HIGH (ref 0.0–3.0)
BICARBONATE (VENOUS): 29 mmol/L (ref 22.0–29.0)
LACTATE: 4.1 mmol/L (ref <=1.9)
O2 SATURATION (VENOUS): 99.5 % (ref 40.0–85.0)
PCO2 (VENOUS): 43 mm[Hg] (ref 41–51)
PH (VENOUS): 7.45 — ABNORMAL HIGH (ref 7.32–7.43)
PO2 (VENOUS): 165 mm[Hg] (ref 35–50)

## 2023-10-10 LAB — LACTIC ACID - SECOND REFLEX
LACTIC ACID: 3.8 mmol/L — ABNORMAL HIGH (ref 0.7–2.0)
LACTIC ACID: 4.9 mmol/L (ref 0.7–2.0)

## 2023-10-10 LAB — STREPTOCOCCUS PNEUMONIAE ANTIGEN,URINE: S.PNEUMONIA ANTIGEN: NEGATIVE

## 2023-10-10 LAB — LEGIONELLA URINE ANTIGEN: LEGIONELLA ANTIGEN: NEGATIVE

## 2023-10-10 LAB — NT-PROBNP: NT-PROBNP: 31 pg/mL (ref <=300)

## 2023-10-10 LAB — VITAMIN B12: VITAMIN B 12: 704 pg/mL (ref 239–931)

## 2023-10-10 LAB — TROPONIN-I
TROPONIN I: <0.01 ng/mL (ref 0.00–0.03)
TROPONIN I: <0.01 ng/mL (ref 0.00–0.03)
TROPONIN I: <0.01 ng/mL (ref 0.00–0.03)

## 2023-10-10 LAB — LAVENDER TOP TUBE

## 2023-10-10 LAB — MAGNESIUM: MAGNESIUM: 1.4 mg/dL — ABNORMAL LOW (ref 1.6–2.3)

## 2023-10-10 LAB — LACTIC ACID - FIRST REFLEX
LACTIC ACID: 4.5 mmol/L (ref 0.7–2.0)
LACTIC ACID: 3.9 mmol/L — ABNORMAL HIGH (ref 0.7–2.0)

## 2023-10-10 LAB — PROCALCITONIN: PROCALCITONIN: 0.16 ng/mL — ABNORMAL HIGH (ref 0.00–0.08)

## 2023-10-10 LAB — GOLD TOP TUBE

## 2023-10-10 MED ORDER — SODIUM CHLORIDE 0.9 % INTRAVENOUS SOLUTION
500.0000 mg | INTRAVENOUS | Status: AC
Start: 2023-10-10 — End: 2023-10-14
  Administered 2023-10-10: 0 mg via INTRAVENOUS
  Administered 2023-10-10 – 2023-10-11 (×2): 500 mg via INTRAVENOUS
  Administered 2023-10-11 – 2023-10-12 (×2): 0 mg via INTRAVENOUS
  Administered 2023-10-12 – 2023-10-13 (×2): 500 mg via INTRAVENOUS
  Administered 2023-10-13: 0 mg via INTRAVENOUS
  Administered 2023-10-14: 500 mg via INTRAVENOUS
  Administered 2023-10-14: 0 mg via INTRAVENOUS
  Filled 2023-10-10 (×5): qty 5

## 2023-10-10 MED ORDER — LORAZEPAM 0.5 MG TABLET
0.5000 mg | ORAL_TABLET | Freq: Three times a day (TID) | ORAL | Status: DC
Start: 2023-10-14 — End: 2023-10-16

## 2023-10-10 MED ORDER — IBUPROFEN 800 MG TABLET
800.0000 mg | ORAL_TABLET | Freq: Four times a day (QID) | ORAL | Status: DC | PRN
Start: 2023-10-10 — End: 2023-10-14

## 2023-10-10 MED ORDER — IPRATROPIUM 0.5 MG-ALBUTEROL 3 MG (2.5 MG BASE)/3 ML NEBULIZATION SOLN
3.0000 mL | INHALATION_SOLUTION | RESPIRATORY_TRACT | Status: DC
Start: 2023-10-10 — End: 2023-10-12
  Administered 2023-10-10 – 2023-10-11 (×6): 3 mL via RESPIRATORY_TRACT
  Administered 2023-10-11: 0 mL via RESPIRATORY_TRACT
  Administered 2023-10-11 – 2023-10-12 (×3): 3 mL via RESPIRATORY_TRACT
  Administered 2023-10-12: 0 mL via RESPIRATORY_TRACT
  Administered 2023-10-12 (×2): 3 mL via RESPIRATORY_TRACT
  Filled 2023-10-10 (×11): qty 3

## 2023-10-10 MED ORDER — IOPAMIDOL 370 MG IODINE/ML (76 %) INTRAVENOUS SOLUTION
100.0000 mL | INTRAVENOUS | Status: AC
Start: 2023-10-10 — End: 2023-10-10
  Administered 2023-10-10: 100 mL via INTRAVENOUS

## 2023-10-10 MED ORDER — AMLODIPINE 5 MG TABLET
5.0000 mg | ORAL_TABLET | Freq: Every day | ORAL | Status: DC
Start: 2023-10-10 — End: 2023-10-12
  Administered 2023-10-10 – 2023-10-11 (×2): 5 mg via ORAL
  Administered 2023-10-12: 0 mg via ORAL
  Filled 2023-10-10 (×3): qty 1

## 2023-10-10 MED ORDER — THIAMINE MONONITRATE (VITAMIN B1) 100 MG TABLET
100.0000 mg | ORAL_TABLET | Freq: Every day | ORAL | Status: DC
Start: 2023-10-11 — End: 2023-10-14
  Administered 2023-10-11 – 2023-10-14 (×4): 100 mg via ORAL
  Filled 2023-10-10 (×4): qty 1

## 2023-10-10 MED ORDER — MUPIROCIN 2 % TOPICAL OINTMENT
TOPICAL_OINTMENT | Freq: Two times a day (BID) | CUTANEOUS | Status: DC
Start: 2023-10-10 — End: 2023-10-15
  Filled 2023-10-10: qty 22

## 2023-10-10 MED ORDER — PIPERACILLIN-TAZOBACTAM 4.5 GRAM/100 ML DEXTROSE(ISO-OSM) IV PIGGYBACK
4.5000 g | INJECTION | INTRAVENOUS | Status: AC
Start: 2023-10-10 — End: 2023-10-10
  Administered 2023-10-10: 0 g via INTRAVENOUS
  Administered 2023-10-10: 4.5 g via INTRAVENOUS
  Filled 2023-10-10: qty 100

## 2023-10-10 MED ORDER — SODIUM CHLORIDE 0.9 % INTRAVENOUS PIGGYBACK
100.0000 mg | INTRAVENOUS | Status: AC
Start: 2023-10-10 — End: 2023-10-10
  Administered 2023-10-10: 0 mg via INTRAVENOUS
  Administered 2023-10-10: 100 mg via INTRAVENOUS
  Filled 2023-10-10: qty 10

## 2023-10-10 MED ORDER — HYDRALAZINE 20 MG/ML INJECTION SOLUTION
10.0000 mg | Freq: Four times a day (QID) | INTRAMUSCULAR | Status: DC | PRN
Start: 2023-10-10 — End: 2023-10-10

## 2023-10-10 MED ORDER — MUPIROCIN 2 % TOPICAL OINTMENT
0.5000 g | TOPICAL_OINTMENT | Freq: Two times a day (BID) | CUTANEOUS | Status: DC
Start: 2023-10-10 — End: 2023-10-10
  Filled 2023-10-10: qty 22

## 2023-10-10 MED ORDER — DOCUSATE SODIUM 100 MG CAPSULE
100.0000 mg | ORAL_CAPSULE | Freq: Two times a day (BID) | ORAL | Status: DC | PRN
Start: 2023-10-10 — End: 2023-10-14

## 2023-10-10 MED ORDER — BENZONATATE 100 MG CAPSULE
100.0000 mg | ORAL_CAPSULE | Freq: Three times a day (TID) | ORAL | Status: DC | PRN
Start: 2023-10-10 — End: 2023-10-10

## 2023-10-10 MED ORDER — METHYLPREDNISOLONE SOD SUCC 125 MG SOLUTION FOR INJECTION WRAPPER
125.0000 mg | INTRAVENOUS | Status: AC
Start: 2023-10-10 — End: 2023-10-10
  Administered 2023-10-10: 125 mg via INTRAVENOUS
  Filled 2023-10-10: qty 2

## 2023-10-10 MED ORDER — IPRATROPIUM 0.5 MG-ALBUTEROL 3 MG (2.5 MG BASE)/3 ML NEBULIZATION SOLN
3.0000 mL | INHALATION_SOLUTION | Freq: Four times a day (QID) | RESPIRATORY_TRACT | Status: DC
Start: 2023-10-10 — End: 2023-10-10
  Administered 2023-10-10 (×2): 3 mL via RESPIRATORY_TRACT
  Filled 2023-10-10 (×2): qty 3

## 2023-10-10 MED ORDER — PIPERACILLIN-TAZOBACTAM 4.5 GRAM/100 ML DEXTROSE(ISO-OSM) IV PIGGYBACK
4.5000 g | INJECTION | Freq: Three times a day (TID) | INTRAVENOUS | Status: DC
Start: 2023-10-11 — End: 2023-10-14
  Administered 2023-10-11 (×3): 4.5 g via INTRAVENOUS
  Administered 2023-10-11 (×2): 0 g via INTRAVENOUS
  Administered 2023-10-12: 4.5 g via INTRAVENOUS
  Administered 2023-10-12: 0 g via INTRAVENOUS
  Administered 2023-10-12 (×2): 4.5 g via INTRAVENOUS
  Administered 2023-10-12 – 2023-10-13 (×4): 0 g via INTRAVENOUS
  Administered 2023-10-13 (×2): 4.5 g via INTRAVENOUS
  Administered 2023-10-13: 0 g via INTRAVENOUS
  Administered 2023-10-13: 4.5 g via INTRAVENOUS
  Administered 2023-10-14: 0 g via INTRAVENOUS
  Administered 2023-10-14 (×2): 4.5 g via INTRAVENOUS
  Administered 2023-10-14: 0 g via INTRAVENOUS
  Filled 2023-10-10 (×11): qty 100

## 2023-10-10 MED ORDER — SODIUM CHLORIDE 0.9 % IV BOLUS
30.0000 mL/kg | INJECTION | Status: AC
Start: 2023-10-10 — End: 2023-10-10
  Administered 2023-10-10: 2466 mL via INTRAVENOUS
  Administered 2023-10-10: 0 mL via INTRAVENOUS

## 2023-10-10 MED ORDER — BUPROPION HCL XL 150 MG 24 HR TABLET, EXTENDED RELEASE
150.0000 mg | ORAL_TABLET | Freq: Every day | ORAL | Status: DC
Start: 2023-10-10 — End: 2023-10-14
  Administered 2023-10-10 – 2023-10-14 (×5): 150 mg via ORAL
  Filled 2023-10-10 (×5): qty 1

## 2023-10-10 MED ORDER — GUAIFENESIN ER 600 MG TABLET, EXTENDED RELEASE 12 HR
600.0000 mg | EXTENDED_RELEASE_TABLET | Freq: Two times a day (BID) | ORAL | Status: DC
Start: 2023-10-10 — End: 2023-10-14
  Administered 2023-10-10 – 2023-10-14 (×9): 600 mg via ORAL
  Filled 2023-10-10 (×8): qty 1

## 2023-10-10 MED ORDER — MORPHINE 2 MG/ML INJECTION WRAPPER
2.0000 mg | INJECTION | INTRAMUSCULAR | Status: AC
Start: 2023-10-10 — End: 2023-10-10
  Administered 2023-10-10: 2 mg via INTRAVENOUS
  Filled 2023-10-10: qty 1

## 2023-10-10 MED ORDER — LORAZEPAM 2 MG TABLET
2.0000 mg | ORAL_TABLET | ORAL | Status: DC | PRN
Start: 2023-10-10 — End: 2023-10-12
  Administered 2023-10-10 – 2023-10-12 (×7): 2 mg via ORAL
  Filled 2023-10-10 (×7): qty 1

## 2023-10-10 MED ORDER — ONDANSETRON HCL (PF) 4 MG/2 ML INJECTION SOLUTION
4.0000 mg | Freq: Four times a day (QID) | INTRAMUSCULAR | Status: DC | PRN
Start: 2023-10-10 — End: 2023-10-14

## 2023-10-10 MED ORDER — BUDESONIDE 0.5 MG/2 ML SUSPENSION FOR NEBULIZATION
0.5000 mg | INHALATION_SUSPENSION | Freq: Two times a day (BID) | RESPIRATORY_TRACT | Status: DC
Start: 2023-10-10 — End: 2023-10-12
  Administered 2023-10-10 – 2023-10-12 (×4): 0.5 mg via RESPIRATORY_TRACT
  Filled 2023-10-10 (×3): qty 2

## 2023-10-10 MED ORDER — BENZONATATE 100 MG CAPSULE
100.0000 mg | ORAL_CAPSULE | Freq: Three times a day (TID) | ORAL | Status: DC
Start: 2023-10-10 — End: 2023-10-14
  Administered 2023-10-10 – 2023-10-14 (×13): 100 mg via ORAL
  Filled 2023-10-10 (×13): qty 1

## 2023-10-10 MED ORDER — LORAZEPAM 2 MG TABLET
2.0000 mg | ORAL_TABLET | Freq: Three times a day (TID) | ORAL | Status: AC
Start: 2023-10-10 — End: 2023-10-12
  Administered 2023-10-10 – 2023-10-12 (×6): 2 mg via ORAL
  Filled 2023-10-10 (×6): qty 1

## 2023-10-10 MED ORDER — HEPARIN (PORCINE) 5,000 UNIT/ML INJECTION SOLUTION
5000.0000 [IU] | Freq: Three times a day (TID) | INTRAMUSCULAR | Status: DC
Start: 2023-10-10 — End: 2023-10-14
  Administered 2023-10-10 – 2023-10-12 (×9): 5000 [IU] via SUBCUTANEOUS
  Administered 2023-10-13: 0 [IU] via SUBCUTANEOUS
  Administered 2023-10-13 – 2023-10-14 (×4): 5000 [IU] via SUBCUTANEOUS
  Filled 2023-10-10 (×11): qty 1

## 2023-10-10 MED ORDER — HYDRALAZINE 20 MG/ML INJECTION SOLUTION
5.0000 mg | Freq: Four times a day (QID) | INTRAMUSCULAR | Status: DC | PRN
Start: 2023-10-10 — End: 2023-10-14
  Administered 2023-10-10 – 2023-10-12 (×3): 5 mg via INTRAVENOUS
  Filled 2023-10-10 (×3): qty 1

## 2023-10-10 MED ORDER — SODIUM CHLORIDE 0.9 % INTRAVENOUS SOLUTION
INTRAVENOUS | Status: DC
Start: 2023-10-10 — End: 2023-10-12
  Administered 2023-10-12 (×2): 0 mL via INTRAVENOUS

## 2023-10-10 MED ORDER — THIAMINE MONONITRATE (VITAMIN B1) 100 MG TABLET
200.0000 mg | ORAL_TABLET | Freq: Once | ORAL | Status: AC
Start: 2023-10-10 — End: 2023-10-10
  Administered 2023-10-10: 200 mg via ORAL
  Filled 2023-10-10: qty 2

## 2023-10-10 MED ORDER — SODIUM CHLORIDE 0.9 % INTRAVENOUS PIGGYBACK
3.0000 g | INTRAVENOUS | Status: AC
Start: 2023-10-10 — End: 2023-10-10
  Administered 2023-10-10: 3 g via INTRAVENOUS
  Administered 2023-10-10: 0 g via INTRAVENOUS
  Filled 2023-10-10: qty 8

## 2023-10-10 MED ORDER — ALLOPURINOL 100 MG TABLET
100.0000 mg | ORAL_TABLET | Freq: Two times a day (BID) | ORAL | Status: DC
Start: 2023-10-10 — End: 2023-10-14
  Administered 2023-10-10 – 2023-10-14 (×9): 100 mg via ORAL
  Filled 2023-10-10 (×9): qty 1

## 2023-10-10 MED ORDER — SODIUM CHLORIDE 0.9 % INTRAVENOUS PIGGYBACK
3.0000 g | Freq: Four times a day (QID) | INTRAVENOUS | Status: DC
Start: 2023-10-10 — End: 2023-10-10
  Administered 2023-10-10 (×2): 3 g via INTRAVENOUS
  Administered 2023-10-10 (×2): 0 g via INTRAVENOUS
  Filled 2023-10-10 (×5): qty 8

## 2023-10-10 MED ORDER — SODIUM CHLORIDE 0.9 % (FLUSH) INJECTION SYRINGE
10.0000 mL | INJECTION | INTRAMUSCULAR | Status: DC | PRN
Start: 2023-10-10 — End: 2023-10-14

## 2023-10-10 MED ORDER — LINEZOLID 600 MG TABLET
600.0000 mg | ORAL_TABLET | Freq: Two times a day (BID) | ORAL | Status: DC
Start: 2023-10-10 — End: 2023-10-14
  Administered 2023-10-10 – 2023-10-14 (×8): 600 mg via ORAL
  Filled 2023-10-10 (×8): qty 1

## 2023-10-10 MED ORDER — LORAZEPAM 1 MG TABLET
1.0000 mg | ORAL_TABLET | ORAL | Status: DC | PRN
Start: 2023-10-10 — End: 2023-10-14
  Administered 2023-10-11 – 2023-10-13 (×2): 1 mg via ORAL
  Filled 2023-10-10 (×2): qty 1

## 2023-10-10 MED ORDER — MAGNESIUM SULFATE 2 GRAM/50 ML IN WATER IVPB PREMIX - Q1H X2 DEFAULT
4.0000 g | INJECTION | Freq: Once | INTRAVENOUS | Status: AC
Start: 2023-10-10 — End: 2023-10-10
  Administered 2023-10-10: 0 g via INTRAVENOUS
  Administered 2023-10-10: 4 g via INTRAVENOUS
  Filled 2023-10-10: qty 100

## 2023-10-10 MED ORDER — LORAZEPAM 1 MG TABLET
1.0000 mg | ORAL_TABLET | Freq: Three times a day (TID) | ORAL | Status: DC
Start: 2023-10-12 — End: 2023-10-12
  Administered 2023-10-12: 1 mg via ORAL
  Filled 2023-10-10: qty 1

## 2023-10-10 MED ORDER — ACETAMINOPHEN 325 MG TABLET
650.0000 mg | ORAL_TABLET | Freq: Four times a day (QID) | ORAL | Status: DC | PRN
Start: 2023-10-10 — End: 2023-10-14

## 2023-10-10 MED ORDER — NICOTINE 21 MG/24 HR DAILY TRANSDERMAL PATCH
21.0000 mg | MEDICATED_PATCH | Freq: Every day | TRANSDERMAL | Status: DC
Start: 2023-10-10 — End: 2023-10-14
  Administered 2023-10-10 – 2023-10-14 (×5): 21 mg via TRANSDERMAL
  Filled 2023-10-10 (×5): qty 1

## 2023-10-10 MED ORDER — METHYLPREDNISOLONE SOD SUCCINATE 40 MG/ML SOLUTION FOR INJ. WRAPPER
40.0000 mg | Freq: Every day | INTRAMUSCULAR | Status: AC
Start: 2023-10-10 — End: 2023-10-14
  Administered 2023-10-10 – 2023-10-14 (×5): 40 mg via INTRAVENOUS
  Filled 2023-10-10 (×5): qty 1

## 2023-10-10 MED ORDER — DIPHENHYDRAMINE 25 MG CAPSULE
25.0000 mg | ORAL_CAPSULE | Freq: Every evening | ORAL | Status: DC | PRN
Start: 2023-10-10 — End: 2023-10-14

## 2023-10-10 MED ORDER — SODIUM CHLORIDE 0.9 % (FLUSH) INJECTION SYRINGE
10.0000 mL | INJECTION | Freq: Three times a day (TID) | INTRAMUSCULAR | Status: DC
Start: 2023-10-10 — End: 2023-10-14
  Administered 2023-10-10: 10 mL
  Administered 2023-10-10 (×2): 0 mL
  Administered 2023-10-11: 10 mL
  Administered 2023-10-11: 0 mL
  Administered 2023-10-11 – 2023-10-12 (×2): 10 mL
  Administered 2023-10-12: 0 mL
  Administered 2023-10-12 – 2023-10-13 (×3): 10 mL
  Administered 2023-10-13 – 2023-10-14 (×2): 0 mL
  Administered 2023-10-14: 10 mL

## 2023-10-10 NOTE — Care Plan (Signed)
Problem: Adult Inpatient Plan of Care  Goal: Plan of Care Review  Outcome: Ongoing (see interventions/notes)  Goal: Patient-Specific Goal (Individualized)  Outcome: Ongoing (see interventions/notes)  Goal: Absence of Hospital-Acquired Illness or Injury  Outcome: Ongoing (see interventions/notes)  Goal: Optimal Comfort and Wellbeing  Outcome: Ongoing (see interventions/notes)  Goal: Rounds/Family Conference  Outcome: Ongoing (see interventions/notes)     Problem: Fall Injury Risk  Goal: Absence of Fall and Fall-Related Injury  Outcome: Ongoing (see interventions/notes)     Problem: Alcohol Withdrawal  Goal: Alcohol Withdrawal Symptom Control  Outcome: Ongoing (see interventions/notes)  Goal: Optimal Neurologic Function  Outcome: Ongoing (see interventions/notes)     Problem: Infection  Goal: Absence of Infection Signs and Symptoms  Outcome: Ongoing (see interventions/notes)     Problem: Pneumonia  Goal: Fluid Balance  Outcome: Ongoing (see interventions/notes)  Goal: Absence of Infection Signs and Symptoms  Outcome: Ongoing (see interventions/notes)  Goal: Effective Oxygenation and Ventilation  Outcome: Ongoing (see interventions/notes)

## 2023-10-10 NOTE — Consults (Addendum)
[facility]  Infectious Disease Initial Consult      Encounter Start Date: 10/10/2023   Patient name: [name] [name]   Date of Birth:  22-Apr-1966  Inpatient Admission Date: 10/10/2023   Date of service: 10/10/2023  Hospital Day:  LOS: 0 days     Requesting MD:  Dr. Harold Hedge, F      Reason for Consult:  increasing SOB, productive cough and fever    Impression:    Acute bilateral bacterial pnuemonia.  He has CAP due to Strep pneumonia, M catth, H influenza and atypical etio inlcuding Legionella and Mycoplasma.    Other organism to consider is MRSA - he has positive MRSA on nasal culture.     Aspiration pneumonia due to Strep and Peptostreptoccus    Ddx:  fungal lung infection.     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     Polysubstance abuse with Percocet and Vicodin.   He denies IVDU     Alcohol abuse with brandy and a case of Twisted Tea every day.     Obesity.        Recommendations:     Will discontinue Unasyn and change to Zosyn.  Will keep Azithromycin.   Will add PO Zyvox.  He may need MBS.     I am going to get fungal serologies.  I ordered Leginella and Strep pneumonia antigen.     Will follow up all pending culture.   Will continue all supportive care.  I have seen and examined the patient today.  I reviewed all pertinent lab work, microbiology cultures and radiologic imaging studies.  I discussed the current management with the patient      Clinical time spent is 1 hour 30 minutes including coordination of pertinent care           Information Obtained from: patient and health care provider      HPI/Discussion:  [name] [name] is a 57 y.o., White male with h/o chronic smoking and vaping with Subotex.  He has chronic back pain and has been abusing narcotics:  Percocet and Vicodin.     4 days PTA, he started having SOB and yellowish productive cough.  His SOB became progressively worse.     2 days PTA,  he started having fever and more productive cough.      He was brought to ER because  he became obtunded.  He was abdmitted.   I saw him today.  He has mild respiratory distress.          Allergies   Allergen Reactions    Naproxen Nausea/ Vomiting     acetaminophen (TYLENOL) tablet, 650 mg, Oral, Q6H PRN  allopurinol (ZYLOPRIM) tablet, 100 mg, Oral, 2x/day  amLODIPine (NORVASC) tablet, 5 mg, Oral, Daily  ampicillin-sulbactam (UNASYN) 3 g in NS 100 mL IVPB - DOCKED, 3 g, Intravenous, Q6H  azithromycin (ZITHROMAX) 500 mg in NS 250 mL IVPB, 500 mg, Intravenous, Q24H  benzonatate (TESSALON) capsule, 100 mg, Oral, Q8H  budesonide (PULMICORT RESPULES) 0.5 mg/2 mL nebulizer suspension, 0.5 mg, Nebulization, 2x/day  buPROPion (WELLBUTRIN XL) 24 hr extended release tablet, 150 mg, Oral, Daily  diphenhydrAMINE (BENADRYL) capsule, 25 mg, Oral, HS PRN  docusate sodium (COLACE) capsule, 100 mg, Oral, 2x/day PRN  guaiFENesin (MUCINEX) extended release tablet - for cough (expectorant), 600 mg, Oral, 2x/day  heparin 5,000 unit/mL injection, 5,000 Units, Subcutaneous, Q8HRS  hydrALAZINE (APRESOLINE) injection 5 mg, 5 mg, Intravenous, Q6H PRN  ibuprofen (  MOTRIN) tablet, 800 mg, Oral, 4x/day PRN  ipratropium-albuterol 0.5 mg-3 mg(2.5 mg base)/3 mL Solution for Nebulization, 3 mL, Nebulization, Q4H  LORazepam (ATIVAN) tablet, 1 mg, Oral, Q2H PRN   Or  LORazepam (ATIVAN) tablet, 2 mg, Oral, Q2H PRN  LORazepam (ATIVAN) tablet, 2 mg, Oral, Q8H  [START ON 10/12/2023] LORazepam (ATIVAN) tablet, 1 mg, Oral, Q8H  [START ON 10/14/2023] LORazepam (ATIVAN) tablet, 0.5 mg, Oral, Q8H  methylPREDNISolone sod succ (SOLU-medrol) 40 mg/mL injection, 40 mg, Intravenous, Daily  mupirocin (BACTROBAN) 2% topical ointment, , Apply Topically, 2x/day  nicotine (NICODERM CQ) transdermal patch (mg/24 hr), 21 mg, Transdermal, Daily  NS flush syringe, 10 mL, Intracatheter, Q8HRS  NS flush syringe, 10 mL, Intracatheter, Q1H PRN  NS premix infusion, , Intravenous, Continuous  ondansetron (ZOFRAN) 2 mg/mL injection, 4 mg, Intravenous, Q6H PRN  [START  ON 10/11/2023] thiamine-vitamin B1 tablet, 100 mg, Oral, Daily      Past Medical History:   Diagnosis Date    Cervical stenosis of spinal canal     Chronic gum disease     RISK OF TEETH FALLING OUT    Chronic obstructive pulmonary disease (CMS HCC) 04/29/2023    Chronic pain     COPD (chronic obstructive pulmonary disease) (CMS HCC)     CVD (cerebrovascular disease)     Exertional dyspnea 04/29/2023    Gait disturbance     Gout     History of anesthesia complications     PATIENT STATED TEETH AS RISK OF BEING KNOCKED OUT DUE TO GUM DISEASE    History of substance use 04/29/2023    Hyperlipidemia     Kidney stones     Laceration of leg 02/2023    Leg weakness, bilateral     Low back pain     Lumbar radiculopathy     MRSA (methicillin resistant Staphylococcus aureus) 10/10/2023    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/10/23    MRSA infection 2013    toe surgery    Neck pain     Neck problem     Oxygen dependent     Wears glasses          Past Surgical History:   Procedure Laterality Date    HX CERVICAL SPINE SURGERY  02/18/2023    HX FOOT SURGERY Left     jont implant left toe    HX HERNIA REPAIR Left     inguinal    LITHOTRIPSY      LUMBAR DISC SURGERY  05/2023         PSYCH. HISTORY:      Antibiotics: Date Started Date Completed   1.     2.     3.     4.       Antibiotics (From admission, onward)      Start     Stop Route Frequency    10/10/23 2100  mupirocin (BACTROBAN) 2% topical ointment  (Mupirocin (BACTROBAN) Nasal Protocol)  Status:  Discontinued         10/10/23 1621 EACH NOSTRIL 2 TIMES DAILY    10/10/23 2100  mupirocin (BACTROBAN) 2% topical ointment         10/15/23 2059 APPLY TOPICA 2 TIMES DAILY    10/10/23 1100  ampicillin-sulbactam (UNASYN) 3 g in NS 100 mL IVPB - DOCKED         -- IV EVERY 6 HOURS    10/10/23 0700  azithromycin (ZITHROMAX) 500 mg in NS 250 mL IVPB  10/15/23 0659 IV EVERY 24 HOURS    10/10/23 0500  ampicillin-sulbactam (UNASYN) 3 g in NS 100 mL IVPB - DOCKED         10/10/23 0524 IV NOW     10/10/23 0500  doxycycline hyclate 100 mg in NS 100 mL IVPB - DOCKED         10/10/23 0624 IV NOW             Social History:  He is self employed.  A GF lives with him.  He smoke and vape with Subotex.  He drinks brandy.  He consumes a case of Twisted Tea everyday.     Family Hx:  both parents are healthy.        ROS:   12 Systems Reviewed.   Pertinent Findings are in the HPI.     EXAMCeasar Mons Vitals:    10/10/23 1140 10/10/23 1441 10/10/23 1442 10/10/23 1700   BP:  (!) 179/92  (!) 178/100   Pulse:  (!) 109  (!) 106   Resp:       Temp:       SpO2: (!) 88%  93% 91%      Temp  Avg: 36.8 C (98.2 F)  Min: 36.5 C (97.7 F)  Max: 37.1 C (98.7 F)    General: acutely ill, moderately obese, and mild distress  Neuro: mildly confused  Eyes: dry eyes  ENT: PERLA, non icteric sclera.   Lungs: bilateral rhonhci   Cardiovascular: distant heart sounds, regular.   Abdomen: obese and soft, non tender.   Extremities: (++) edema   Skin: dry.     Labs:    Results for orders placed or performed during the hospital encounter of 10/10/23 (from the past 24 hour(s))   RESPIRATORY CULTURE AND GRAM STAIN, AEROBIC - SPUTUM    Specimen: Sputum; Other    Narrative    The following orders were created for panel order RESPIRATORY CULTURE AND GRAM STAIN, AEROBIC - SPUTUM.  Procedure                               Abnormality         Status                     ---------                               -----------         ------                     SPUTUM [id number]]                                    Final result                 Please view results for these tests on the individual orders.   MRSA SCREEN, PCR, RAPID    Specimen: Nasal Swab   Result Value Ref Range    MRSA COLONIZATION SCREEN Positive (A) Negative   RESPIRATORY CULTURE AND GRAM STAIN (PERFORMABLE)    Specimen: Sputum   Result Value Ref Range    GRAM STAIN 3+ Several WBCs     GRAM STAIN 2+ Few Gram Positive Cocci in Pairs  CBC/DIFF    Narrative    The following orders were  created for panel order CBC/DIFF.  Procedure                               Abnormality         Status                     ---------                               -----------         ------                     CBC WITH DIFF[[id number]]                Abnormal            Final result                 Please view results for these tests on the individual orders.   COMPREHENSIVE METABOLIC PANEL, NON-FASTING   Result Value Ref Range    SODIUM 137 137 - 145 mmol/L    POTASSIUM 5.0 3.5 - 5.1 mmol/L    CHLORIDE 97 (L) 98 - 107 mmol/L    CO2 TOTAL 31 (H) 22 - 30 mmol/L    ANION GAP 9 5 - 19 mmol/L    BUN <2 (L) 9 - 20 mg/dL    CREATININE 2.44 (L) 0.66 - 1.20 mg/dL    BUN/CREA RATIO      ESTIMATED GFR >60 >60 mL/min/1.84m^2    ALBUMIN 4.3 3.5 - 5.0 g/dL    CALCIUM 9.7 8.4 - 01.0 mg/dL    GLUCOSE 272 (H) 74 - 106 mg/dL    ALKALINE PHOSPHATASE 121 38 - 126 U/L    ALT (SGPT) 77 (H) <50 U/L    AST (SGOT) 104 (H) 17 - 59 U/L    BILIRUBIN TOTAL 1.2 0.2 - 1.3 mg/dL    PROTEIN TOTAL 8.0 6.3 - 8.2 g/dL    ALBUMIN/GLOBULIN RATIO 1.2 (L) 1.5 - 2.5    Narrative    Estimated Glomerular Filtration Rate (eGFR) is calculated using the CKD-EPI (2021) equation, intended for patients 71 years of age and older. If gender is not documented or "unknown", there will be no eGFR calculation.   TROPONIN-I (Q3H X 3)   Result Value Ref Range    TROPONIN I <0.01 0.00 - 0.03 ng/mL   TROPONIN-I (Q3H X 3)   Result Value Ref Range    TROPONIN I <0.01 0.00 - 0.03 ng/mL   CBC WITH DIFF   Result Value Ref Range    WBC 8.8 4.5 - 11.5 x10^3/uL    RBC 4.22 (L) 4.30 - 5.90 x10^6/uL    HGB 14.5 13.9 - 16.3 g/dL    HCT 53.6 64.4 - 03.4 %    MCV 103.6 (H) 80.0 - 100.0 fL    MCH 34.4 (H) 25.4 - 34.0 pg    MCHC 33.2 30.0 - 37.0 g/dL    RDW 74.2 (H) 59.5 - 14.0 %    PLATELETS 273 130 - 400 x10^3/uL    MPV 7.3 (L) 7.5 - 11.5 fL    NEUTROPHIL % 69 41 - 69 %    LYMPHOCYTE % 16 (L) 19 - 46 %    MONOCYTE %  15 (H) 4 - 12 %    EOSINOPHIL % 0 0 - 5 %    BASOPHIL % 0 0 - 2 %     NEUTROPHIL # 6.10 1.80 - 7.50 x10^3/uL    LYMPHOCYTE # 1.40 1.10 - 3.80 x10^3/uL    MONOCYTE # 1.30 (H) 0.10 - 0.80 x10^3/uL    EOSINOPHIL # 0.00 0.00 - 0.60 x10^3/uL    BASOPHIL # 0.00 0.00 - 0.20 x10^3/uL   COVID-19, FLU A/B, RSV RAPID BY PCR - Symptomatic   Result Value Ref Range    SARS-CoV-2 Not Detected Not Detected    INFLUENZA VIRUS TYPE A Not Detected Not Detected    INFLUENZA VIRUS TYPE B Not Detected Not Detected    RESPIRATORY SYNCTIAL VIRUS (RSV) Not Detected Not Detected    Narrative    Results are for the simultaneous qualitative identification of SARS-CoV-2 (formerly 2019-nCoV), Influenza A, Influenza B, and RSV RNA. These etiologic agents are generally detectable in nasopharyngeal and nasal swabs during the ACUTE PHASE of infection. Hence, this test is intended to be performed on respiratory specimens collected from individuals with signs and symptoms of upper respiratory tract infection who meet Centers for Disease Control and Prevention (CDC) clinical and/or epidemiological criteria for Coronavirus Disease 2019 (COVID-19) testing. CDC COVID-19 criteria for testing on human specimens is available at [facility] webpage information for Healthcare Professionals: Coronavirus Disease 2019 (COVID-19) ([email or web address]).     False-negative results may occur if the virus has genomic mutations, insertions, deletions, or rearrangements or if performed very early in the course of illness. Otherwise, negative results indicate virus specific RNA targets are not detected, however negative results do not preclude SARS-CoV-2 infection/COVID-19, Influenza, or Respiratory syncytial virus infection. Results should not be used as the sole basis for patient management decisions. Negative results must be combined with clinical observations, patient history, and epidemiological information. If upper respiratory tract infection is still suspected based on exposure history together with  other clinical findings, re-testing should be considered.    Test methodology:   Cepheid Xpert Xpress SARS-CoV-2/Flu/RSV Assay real-time polymerase chain reaction (RT-PCR) test on the GeneXpert Dx and Xpert Xpress systems.   NT-PROBNP   Result Value Ref Range    NT-PROBNP 31 <=300 pg/mL   MAGNESIUM   Result Value Ref Range    MAGNESIUM 1.4 (L) 1.6 - 2.3 mg/dL   LACTIC ACID LEVEL W/ REFLEX FOR LEVEL >2.0   Result Value Ref Range    LACTIC ACID 4.1 (HH) 0.7 - 2.0 mmol/L   BLOOD GAS W/ LACTATE REFLEX Venous   Result Value Ref Range    %FIO2 (VENOUS) 32.0 %    PH (VENOUS) 7.45 (H) 7.32 - 7.43    PCO2 (VENOUS) 43 41 - 51 mm/Hg    PO2 (VENOUS) 165 35 - 50 mm/Hg    BICARBONATE (VENOUS) 29.0 22.0 - 29.0 mmol/L    BASE EXCESS 5.2 (H) 0.0 - 3.0 mmol/L    O2 SATURATION (VENOUS) 99.5 40.0 - 85.0 %    LACTATE 4.1 (HH) <=1.9 mmol/L    Narrative    Manufacturer does not recommend venous sample for assessment of patient oxygenation status. A reference range for pO2, Oxyhemoglobin and Oxygen Saturation is provided but abnormal results will not flag in Epic.   LACTIC ACID - FIRST REFLEX   Result Value Ref Range    LACTIC ACID 4.5 (HH) 0.7 - 2.0 mmol/L   TROPONIN-I (Q3H X 3)   Result Value Ref Range    TROPONIN  I <0.01 0.00 - 0.03 ng/mL   VITAMIN B12   Result Value Ref Range    VITAMIN B 12 704 239 - 931 pg/mL   LACTIC ACID - SECOND REFLEX   Result Value Ref Range    LACTIC ACID 3.8 (H) 0.7 - 2.0 mmol/L   LACTIC ACID LEVEL W/ REFLEX FOR LEVEL >2.0   Result Value Ref Range    LACTIC ACID 3.4 (H) 0.7 - 2.0 mmol/L   PROCALCITONIN   Result Value Ref Range    PROCALCITONIN  0.16 (H) 0.00 - 0.08 ng/mL   EXTRA TUBES    Narrative    The following orders were created for panel order EXTRA TUBES.  Procedure                               Abnormality         Status                     ---------                               -----------         ------                     GOLD TOP RJJO[[id number]]                                    Final result                LAVENDER TOP [phone]                                Final result                 Please view results for these tests on the individual orders.   GOLD TOP TUBE   Result Value Ref Range    RAINBOW/EXTRA TUBE AUTO RESULT Yes    LAVENDER TOP TUBE   Result Value Ref Range    RAINBOW/EXTRA TUBE AUTO RESULT Yes    LACTIC ACID - FIRST REFLEX   Result Value Ref Range    LACTIC ACID 3.9 (H) 0.7 - 2.0 mmol/L        Microbiology:     No results found for: "ABC2"  No results found for this or any previous visit (from the past [id number] hour(s)).  No results found for: "URINECX"      Imaging Studies:   Results for orders placed or performed during the hospital encounter of 10/10/23   XR CHEST PA AND LATERAL     Status: None    Narrative    Chrissie Noa T Dann    RADIOLOGIST: Elna Breslow, MD    XR CHEST PA AND LATERAL performed on 10/10/2023 1:55 AM    CLINICAL HISTORY: SOB.  c/o cough, dyspnea prev: pneumonia    TECHNIQUE: Frontal and lateral views of the chest.    COMPARISON:  05/06/2023    FINDINGS:    The heart size is normal.  The mediastinal contour is unremarkable.  Atelectasis right lung base. There is some airspace opacity left midlung which could be atelectasis or pneumonia.   The bones are unremarkable.  Impression    NO ACUTE FINDINGS.      Radiologist location ID: [id number]     CT ANGIO CHEST FOR PULMONARY EMBOLUS W IV CONTRAST     Status: None    Narrative    Chrissie Noa T Polansky    RADIOLOGIST: Alvester Chou, MD    CT ANGIO CHEST FOR PULMONARY EMBOLUS W IV CONTRAST performed on 10/10/2023 3:15 AM    CLINICAL HISTORY: tachy SOB recent surgery.  pt sts dyspnea and cough x 2 wks, but progressed today. pmh COPD.    TECHNIQUE: CTA imaging of the chest with intravenous contrast.  3D reconstructions.  CONTRAST:  200 ml's of Isovue 370    COMPARISON: 02/15/2023         FINDINGS:  Hardware:  None.    Lymph nodes:   No mediastinal, hilar, or axillary lymphadenopathy.    Heart:  Coronary artery  calcifications are noted.        RV/LV Diameter Ratio: N/A    Thoracic Aorta:  No thoracic aortic aneurysm or dissection.    Pulmonary Vessels:  No evidence of acute pulmonary emboli through the major subsegmental branches.     Most Proximal Level Of Embolus (if embolus present): N/A    Lungs and Airways:  There are some patchy opacities in the left upper lobe likely representing pneumonia. Associated with these opacities is an oval-shaped nodular density measuring 1.8 cm in diameter on axial image #99. This could be due to pneumonia but this will warrant short-term follow-up to confirm resolution and exclude a pulmonary nodule. In the right upper lobe on axial image #66, there is a 9 mm pulmonary nodule. This is new compared to 02/15/2023. This also warrant short-term follow-up CT scanning. There are some subtle peribronchial opacities in the right upper lobe which could be infectious or inflammatory in etiology.    Right lower lobe atelectasis and right middle lobe atelectasis is present.    Pleura: No pleural effusion.  No pneumothorax.    Upper Abdomen: Hepatomegaly with fatty liver    Bones: Bone windows are unremarkable.        Impression    1. NO PULMONARY EMBOLUS  2. PATCHY OPACITIES IN THE LEFT UPPER LOBE LIKELY DUE TO PNEUMONIA  3. 1.8 CM OVAL-SHAPED NODULAR DENSITY LEFT UPPER LOBE COULD BE DUE TO PNEUMONIA BUT WARRANTS SHORT-TERM FOLLOW-UP CT SCANNING IN 3 MONTHS TO CONFIRM RESOLUTION AND EXCLUDE A PULMONARY NODULE  4. 9 MM RIGHT UPPER LOBE LUNG NODULE IS NEW COMPARED TO 02/15/2023. RECOMMEND SHORT-TERM FOLLOW-UP CT SCANNING IN 3 MONTHS  5. SUBTLE PERIBRONCHIAL OPACITIES IN THE RIGHT UPPER LOBE COULD BE INFECTIOUS OR INFLAMMATORY IN ETIOLOGY  6. RIGHT MIDDLE LOBE AND RIGHT LOWER LOBE ATELECTASIS      One or more dose reduction techniques were used (e.g., Automated exposure control, adjustment of the mA and/or kV according to patient size, use of iterative reconstruction technique).      Radiologist location  ID: [id number]     XR AP MOBILE CHEST     Status: None    Narrative    Chrissie Noa T Minkoff    RADIOLOGIST: Mickey Farber, MD    XR AP MOBILE CHEST performed on 10/10/2023 1:50 PM    CLINICAL HISTORY: f/u pna.  reeval pneumonia status    TECHNIQUE: Frontal view of the chest.    COMPARISON:  Prior exam from today    FINDINGS:    Heart size is mildly enlarged.     As before there  are left perihilar and right basilar infiltrates with elevation of the right diaphragm   There are several old right rib fracture deformities        Impression    Persistent bilateral infiltrates/atelectasis similar to the prior exam        Radiologist location ID: [id number]          Parker Sawatzky Lilla Shook, MD

## 2023-10-10 NOTE — H&P (Signed)
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  No fevers or chest pain.    Tachycardic on arrival and hypoxic on RA. Lactate 4.1. CXR negative. CTA negative for PE but with evidence of PNA.  On exam he feels like he is withdrawing from alcohol.  He drank shortly before he came in.  He has sutures in his left wrist from recent carpal tunnel surgery that were to be removed on Monday but he missed his appointment.     PMHx:    Past Medical History:   Diagnosis Date    Cervical stenosis of spinal canal     Chronic gum disease     RISK OF TEETH FALLING OUT    Chronic obstructive pulmonary disease (CMS HCC) 04/29/2023    Chronic pain     COPD (chronic obstructive pulmonary disease) (CMS HCC)     CVD (cerebrovascular disease)     Exertional dyspnea 04/29/2023    Gait disturbance     Gout     History of anesthesia complications     PATIENT STATED TEETH AS RISK OF BEING KNOCKED OUT DUE TO GUM DISEASE    History of substance use 04/29/2023    Hyperlipidemia     Kidney stones     Laceration of leg 02/2023    Leg weakness, bilateral     Low back pain     Lumbar radiculopathy     MRSA infection 2013    toe surgery    Neck pain     Neck problem     Oxygen dependent     Wears glasses      PSHx:   Past Surgical History:   Procedure Laterality Date    HX CERVICAL SPINE SURGERY  02/18/2023    HX FOOT SURGERY Left     jont implant left toe    HX HERNIA REPAIR Left     inguinal    LITHOTRIPSY      LUMBAR DISC SURGERY  05/2023       Allergies:    Allergies   Allergen Reactions    Naproxen Nausea/ Vomiting    Social History:  Quit smoking cigarettes but vapes regularly.  Drinks liquor every single day, last drink 12/4.  Uses Suboxone off the  streets.    Family History:  Family Medical History:       Problem Relation (Age of Onset)    COPD Mother    Seizures Sister                 Review of systems:  All systems were reviewed with patient and found to be negative, except for what has been mentioned in the above HPI.      Physical examination:  Vital signs reviewed.  Gen: Not in acute distress.  Heent: NC, AT.    Neck: Supple, No JVD.    Heart: S1 S2 auscultated, tachycardic.  Lungs:  Expiratory wheezes throughout, lungs diminished.  On 3 L nasal cannula.  Respirations nonlabored.  Abdomen: Soft, non distended, nontender, normoactive bowels  Neuro: No gross motor or sensory deficit, awake, alert, oriented x3.  Musculoskeletal: FROM, no chest wall tenderness. No homan's.  EXT:  Trace nonpitting edema  to bilateral lower extremities.    Skin: No rash.   Psych: Normal affect, congruent mood.    Medications Prior to Admission       Prescriptions    acetaminophen (TYLENOL) 325 mg Oral Tablet    Take 2 Tablets (650 mg total) by mouth Every 6 hours    Patient taking differently:  Take 2 Tablets (650 mg total) by mouth Every 6 hours as needed    albuterol (PROVENTIL) 2.5 mg/0.5 mL Inhalation Solution for Nebulization    Take 0.5 mL (2.5 mg total) by nebulization Three times a day as needed    allopurinoL (ZYLOPRIM) 100 mg Oral Tablet    Take 1 Tablet (100 mg total) by mouth Twice daily    buprenorphine HCl/naloxone HCl (SUBOXONE SL)    Place 2 mg under the tongue Once a day ASKED PATIENT WHAT THE MG WAS ON THIS MEDICATION AND He STATES THAT He DOES NOT HAVE A PRESCRIPTION FOR IT He BUYS IT OFF THE "[employer]"  It's an 8mg  strip that he cuts into 8 pieces , uses 1-2 pieces at a time    buPROPion (WELLBUTRIN XL) 150 mg extended release 24 hr tablet    Take 1 Tablet (150 mg total) by mouth Once a day Indications: stop smoking    Ibuprofen (MOTRIN) 200 mg Oral Tablet    Take 4 Tablets (800 mg total) by mouth Four times a day as needed for Pain Last dose 11/8              Labs, Imaging studies, and other diagnostic data reviewed.    Results for orders placed or performed during the hospital encounter of 10/10/23 (from the past 24 hour(s))   CBC WITH DIFF   Result Value Ref Range    WBC 8.8 4.5 - 11.5 x10^3/uL    RBC 4.22 (L) 4.30 - 5.90 x10^6/uL    HGB 14.5 13.9 - 16.3 g/dL    HCT 47.8 29.5 - 62.1 %    MCV 103.6 (H) 80.0 - 100.0 fL    MCH 34.4 (H) 25.4 - 34.0 pg    MCHC 33.2 30.0 - 37.0 g/dL    RDW 30.8 (H) 65.7 - 14.0 %    PLATELETS 273 130 - 400 x10^3/uL    MPV 7.3 (L) 7.5 - 11.5 fL    NEUTROPHIL % 69 41 - 69 %    LYMPHOCYTE % 16 (L) 19 - 46 %    MONOCYTE % 15 (H) 4 - 12 %    EOSINOPHIL % 0 0 - 5 %    BASOPHIL % 0 0 - 2 %    NEUTROPHIL # 6.10 1.80 - 7.50 x10^3/uL    LYMPHOCYTE # 1.40 1.10 - 3.80 x10^3/uL    MONOCYTE # 1.30 (H) 0.10 - 0.80 x10^3/uL    EOSINOPHIL # 0.00 0.00 - 0.60 x10^3/uL    BASOPHIL # 0.00 0.00 - 0.20 x10^3/uL   COVID-19, FLU A/B, RSV RAPID BY PCR - Symptomatic   Result Value Ref Range    SARS-CoV-2 Not Detected Not Detected    INFLUENZA VIRUS TYPE A Not Detected Not Detected    INFLUENZA VIRUS TYPE B Not Detected Not Detected    RESPIRATORY SYNCTIAL VIRUS (RSV) Not Detected Not Detected   LACTIC ACID LEVEL W/ REFLEX FOR LEVEL >2.0   Result Value Ref Range    LACTIC ACID 4.1 (HH) 0.7 - 2.0 mmol/L   COMPREHENSIVE METABOLIC PANEL, NON-FASTING   Result Value Ref Range  SODIUM 137 137 - 145 mmol/L    POTASSIUM 5.0 3.5 - 5.1 mmol/L    CHLORIDE 97 (L) 98 - 107 mmol/L    CO2 TOTAL 31 (H) 22 - 30 mmol/L    ANION GAP 9 5 - 19 mmol/L    BUN <2 (L) 9 - 20 mg/dL    CREATININE 1.61 (L) 0.66 - 1.20 mg/dL    BUN/CREA RATIO      ESTIMATED GFR >60 >60 mL/min/1.24m^2    ALBUMIN 4.3 3.5 - 5.0 g/dL    CALCIUM 9.7 8.4 - 09.6 mg/dL    GLUCOSE 045 (H) 74 - 106 mg/dL    ALKALINE PHOSPHATASE 121 38 - 126 U/L    ALT (SGPT) 77 (H) <50 U/L    AST (SGOT) 104 (H) 17 - 59 U/L    BILIRUBIN TOTAL 1.2 0.2 - 1.3 mg/dL    PROTEIN TOTAL 8.0 6.3 - 8.2 g/dL    ALBUMIN/GLOBULIN RATIO 1.2 (L)  1.5 - 2.5   TROPONIN-I (Q3H X 3)   Result Value Ref Range    TROPONIN I <0.01 0.00 - 0.03 ng/mL   NT-PROBNP   Result Value Ref Range    NT-PROBNP 31 <=300 pg/mL   MAGNESIUM   Result Value Ref Range    MAGNESIUM 1.4 (L) 1.6 - 2.3 mg/dL   BLOOD GAS W/ LACTATE REFLEX Venous   Result Value Ref Range    %FIO2 (VENOUS) 32.0 %    PH (VENOUS) 7.45 (H) 7.32 - 7.43    PCO2 (VENOUS) 43 41 - 51 mm/Hg    PO2 (VENOUS) 165 35 - 50 mm/Hg    BICARBONATE (VENOUS) 29.0 22.0 - 29.0 mmol/L    BASE EXCESS 5.2 (H) 0.0 - 3.0 mmol/L    O2 SATURATION (VENOUS) 99.5 40.0 - 85.0 %    LACTATE 4.1 (HH) <=1.9 mmol/L   TROPONIN-I (Q3H X 3)   Result Value Ref Range    TROPONIN I <0.01 0.00 - 0.03 ng/mL     XR CHEST PA AND LATERAL   Final Result by Edi, Radresults In (12/05 0201)   NO ACUTE FINDINGS.         Radiologist location ID: [id number]         CT ANGIO CHEST FOR PULMONARY EMBOLUS W IV CONTRAST    (Results Pending)      No results found for this visit on 10/10/23 (from the past 720 hour(s)).     Assessment and plan:   Active Hospital Problems   (*Primary Problem)    Diagnosis    *Pneumonia    Sepsis (CMS HCC)    Acute hypoxic respiratory failure (CMS HCC)    Vapes nicotine containing substance    History of substance use    Chronic obstructive pulmonary disease (CMS HCC)     Severe sepsis 2/2 acute bacterial PNA from suspected gram +/atypical organisms  Lactic acidosis   Meets sepsis criteria given tachycardia, tachypnea, and pulmonary source; severe w/ elevated lactate  COVID and flu ruled out  Continue tx with Zithromax and Unaysn  Received 30 cc/kilogram IV fluid bolus  Check blood cultures, urine legionella, urine strep, and sputum culture     AECOPD  Duoneb treatments q.6 hours   Solu-Medrol 40 mg IV push daily x5 days   Azithromycin 500 IV daily x5 days  Supplemental O2 PRN   Tessalon Perles 100 mg q.8 hours PRN    Acute hypoxic respiratory failure from above  CTA negative for PE  Continue supplemental  O2 therapy and attempt to wean as  tolerated     Hypomagnesemia  Received 2 gm IV mag on admission    Macrocytic anemia  Likely r/t alcohol   B12 pending     HTN  Not on meds  IV hydralazine PRN    Lung nodule  Noted on CT  Outpatient surveillance recommended    Transaminitis   Chronic, stable, r/t alcohol use and fatty liver    Alcohol use disorder  Last alcoholic drink 12/5  Alcohol cessation advised  CIWA protocol in place    Opioid use disorder  Gets Suboxone off the street, not currently prescribed it    Tobacco use disorder  Smoking cessation discussed for <3 minutes  Nicotine patch provided    Carpal tunnel syndrome  Post surgery with Dr. 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    Code status: FULL CODE: ATTEMPT RESUSCITATION/CPR  DVT prophylaxis: Heparin SQ  Diet: DIET REGULAR  Admission status: Inpatient   Plan of care will be discussed with Dr. Samuella Cota, NP       This note was partially generated using MModal Fluency Direct system, and there may be some incorrect words, spellings, and punctuation that were not noted in checking the note before saving.    I personally and independently saw and examined this patient on the date of service stated above as part of a shared service with the APP. I discussed the case with the APP and generally agree with the history, examination findings, diagnoses, and plan as formulated in the APP's note. Exceptions noted below, if any.    Needs monitored for ETOH and Suboxone withdrawal. He buys suboxone off the streets.    On the date of this  encounter, 25 minutes were spent by me on this patient encounter. The time was spent on review of historical information, post visit activities, examination, ordering medications, tests, procedures, documentation,  interpretation of results, communication with other healthcare professionals, counseling and education of patient/family.    Trellis Moment, MD  Hospitalist

## 2023-10-10 NOTE — ED Triage Notes (Signed)
57y/o male presents to ed c/o cough and shortness of breath worsening over two weeks. States HX COPD, has been taking prescribed breathing treatments, has not been using prescribed PRN oxygen, and is not getting better. Last breathing treatment 1900

## 2023-10-10 NOTE — Progress Notes (Signed)
The patient did not appear for their appointment/or scheduled appointment was cancelled.  This office visit opened in error.

## 2023-10-10 NOTE — Care Management Notes (Signed)
[facility]  Care Management Initial Evaluation    Patient Name: [name] [name]  Date of Birth: [birth year]  Sex: male  Date/Time of Admission: 10/10/2023 12:49 AM  Room/Bed: 570/A  Payor: HEALTH PLAN MEDICAID / Plan: HEALTH PLAN MEDICAID / Product Type: Medicaid [place] /   Primary Care Providers:  Levon Hedger, DO (General)    Pharmacy Info:   Preferred Pharmacy       [facility] [id number] - [place], [place] - [address]    [address] [place] [address]    Phone: [phone] Fax: [phone]    Hours: Not open 24 hours          Emergency Contact Info:   Extended Emergency Contact Information  Primary Emergency Contact: Mcginn,AMBER  Address: [address]  Home Phone: [phone]  Work Phone: [phone]  Mobile Phone: [phone]  Relation: Daughter  Preferred language: English  Interpreter needed? No    History:   KAMARIEN LANGRIDGE is a 57 y.o., male, admitted 10/10/23.    Height/Weight: 188 cm (6\' 2" ) / (!) 139 kg (307 lb)     LOS: 0 days   Admitting Diagnosis: Pneumonia [J18.9]    Assessment:      10/10/23 1153   Assessment Details   Assessment Type Admission   Date of Care Management Update 10/10/23   Readmission   Is this a readmission? No   Insurance Information/Type   Insurance type Medicaid   Employment/Financial   Patient has Prescription Coverage?  Yes        Name of Insurance Coverage for Medications Health Plan Medicaid   Financial/Environmental Concerns none   Living Environment   Select an age group to open "lives with" row.  Adult   Lives With alone   Living Arrangements house   Home Safety   Home Assessment: No Problems Identified   Home Accessibility no concerns   Care Management Plan   Discharge Planning Status initial meeting   Projected Discharge Date 10/12/23   Discharge plan discussed with: Patient   Discharge Needs Assessment   Equipment Currently Used at Home oxygen;nebulizer   Discharge Facility/Level of Care Needs Home (Patient/Family  Member/other)(code 1)   Transportation Available car   Referral Information   Admission Type inpatient   Arrived From home or self-care     Patient admitted on 10/10/23 for pneumonia. IV Unasyn, Zithromax, and Solumedrol ordered. Currently on 3L NC. IV Unasyn, Zithromax, and Solumedrol ordered. History of alcohol abuse, CIWA protocol initiated. PRN Ativan ordered. CM met with patient at bedside. Home address, insurance and PCP verified. Patient lives at home alone. Patient uses a cane, nebulizer and 2L O2 as needed which is supplied through [employer]. Patient is able to drive and has adequate transportation. Patient not currently under care of home health agency.     Discharge Plan:  Home (Patient/Family Member/other) (code 1)  Patient is an independent home plan    The patient will continue to be evaluated for developing discharge needs.     Case Manager: Rica Records, RN

## 2023-10-10 NOTE — Progress Notes (Addendum)
[facility]       HOSPITALIST PROGRESS NOTE      Date of Service:  10/10/2023  Arno, Tuazon, 57 y.o. male  Date of Admission:  10/10/2023  Date of Birth:  1965/12/04  PCP: Cecilie Kicks, DO    Chief Complaint: pneumonia, sepsis     Subjective:  patient is stable. On 6L NC. Baseline 2L NC prn. On unasyn and azithro. Denies any symptoms at this time. Having withdrawal symptoms. ID consulted    Review of systems other than the above were negative     Current Facility-Administered Medications   Medication Dose Route Frequency    acetaminophen (TYLENOL) tablet  650 mg Oral Q6H PRN    allopurinol (ZYLOPRIM) tablet  100 mg Oral 2x/day    ampicillin-sulbactam (UNASYN) 3 g in NS 100 mL IVPB - DOCKED  3 g Intravenous Q6H    azithromycin (ZITHROMAX) 500 mg in NS 250 mL IVPB  500 mg Intravenous Q24H    benzonatate (TESSALON) capsule  100 mg Oral Q8H PRN    buPROPion (WELLBUTRIN XL) 24 hr extended release tablet  150 mg Oral Daily    diphenhydrAMINE (BENADRYL) capsule  25 mg Oral HS PRN    docusate sodium (COLACE) capsule  100 mg Oral 2x/day PRN    heparin 5,000 unit/mL injection  5,000 Units Subcutaneous Q8HRS    hydrALAZINE (APRESOLINE) injection 10 mg  10 mg Intravenous Q6H PRN    ibuprofen (MOTRIN) tablet  800 mg Oral 4x/day PRN    ipratropium-albuterol 0.5 mg-3 mg(2.5 mg base)/3 mL Solution for Nebulization  3 mL Nebulization 4x/day    LORazepam (ATIVAN) tablet  1 mg Oral Q2H PRN    Or    LORazepam (ATIVAN) tablet  2 mg Oral Q2H PRN    methylPREDNISolone sod succ (SOLU-medrol) 40 mg/mL injection  40 mg Intravenous Daily    nicotine (NICODERM CQ) transdermal patch (mg/24 hr)  21 mg Transdermal Daily    NS flush syringe  10 mL Intracatheter Q8HRS    NS flush syringe  10 mL Intracatheter Q1H PRN    ondansetron (ZOFRAN) 2 mg/mL injection  4 mg Intravenous Q6H PRN    [START ON 10/11/2023] thiamine-vitamin B1 tablet  100 mg Oral Daily        Allergies   Allergen Reactions    Naproxen Nausea/ Vomiting             Filed  Vitals:    10/10/23 0800 10/10/23 0948 10/10/23 0949 10/10/23 1140   BP:  (!) 164/96     Pulse:  (!) 103     Resp:  20     Temp:  37.1 C (98.7 F)     SpO2: 91%  90% (!) 88%     O2 delivery: Nasal Cannula (3L)      Physical Exam:  General:  no acute distress, on 6L NC, 2L prn baseline. pleasant   Skin: no lesions or rashes   HEENT: PERRLA, EOMI, NC/AT, no scleral icterus. Normal neck, no adenopathy, no thyromegaly, no mass, normal lips, no nasal discharge.   Respiratory:  normal breathing, wheezing and rhonchis b/l no respiratory distress. Diminished   Cardiovascular:  RRR, no murmur, no edema, pulses intact  Gastrointestinal: normal abdomen, normal active bowel sounds. No mass or tenderness.   Musculoskeletal: no cyanosis or edema.   Neurological: AAO x 3, normal strength and motor function  Genitourinary:  No incontinence or Foley catheter   Psych: no depression or anxiety  Laboratory Data:     Results for orders placed or performed during the hospital encounter of 10/10/23 (from the past 24 hour(s))   CBC WITH DIFF   Result Value Ref Range    WBC 8.8 4.5 - 11.5 x10^3/uL    RBC 4.22 (L) 4.30 - 5.90 x10^6/uL    HGB 14.5 13.9 - 16.3 g/dL    HCT 42.7 06.2 - 37.6 %    MCV 103.6 (H) 80.0 - 100.0 fL    MCH 34.4 (H) 25.4 - 34.0 pg    MCHC 33.2 30.0 - 37.0 g/dL    RDW 28.3 (H) 15.1 - 14.0 %    PLATELETS 273 130 - 400 x10^3/uL    MPV 7.3 (L) 7.5 - 11.5 fL    NEUTROPHIL % 69 41 - 69 %    LYMPHOCYTE % 16 (L) 19 - 46 %    MONOCYTE % 15 (H) 4 - 12 %    EOSINOPHIL % 0 0 - 5 %    BASOPHIL % 0 0 - 2 %    NEUTROPHIL # 6.10 1.80 - 7.50 x10^3/uL    LYMPHOCYTE # 1.40 1.10 - 3.80 x10^3/uL    MONOCYTE # 1.30 (H) 0.10 - 0.80 x10^3/uL    EOSINOPHIL # 0.00 0.00 - 0.60 x10^3/uL    BASOPHIL # 0.00 0.00 - 0.20 x10^3/uL   COVID-19, FLU A/B, RSV RAPID BY PCR - Symptomatic   Result Value Ref Range    SARS-CoV-2 Not Detected Not Detected    INFLUENZA VIRUS TYPE A Not Detected Not Detected    INFLUENZA VIRUS TYPE B Not Detected Not  Detected    RESPIRATORY SYNCTIAL VIRUS (RSV) Not Detected Not Detected   LACTIC ACID LEVEL W/ REFLEX FOR LEVEL >2.0   Result Value Ref Range    LACTIC ACID 4.1 (HH) 0.7 - 2.0 mmol/L   COMPREHENSIVE METABOLIC PANEL, NON-FASTING   Result Value Ref Range    SODIUM 137 137 - 145 mmol/L    POTASSIUM 5.0 3.5 - 5.1 mmol/L    CHLORIDE 97 (L) 98 - 107 mmol/L    CO2 TOTAL 31 (H) 22 - 30 mmol/L    ANION GAP 9 5 - 19 mmol/L    BUN <2 (L) 9 - 20 mg/dL    CREATININE 7.61 (L) 0.66 - 1.20 mg/dL    BUN/CREA RATIO      ESTIMATED GFR >60 >60 mL/min/1.36m^2    ALBUMIN 4.3 3.5 - 5.0 g/dL    CALCIUM 9.7 8.4 - 60.7 mg/dL    GLUCOSE 371 (H) 74 - 106 mg/dL    ALKALINE PHOSPHATASE 121 38 - 126 U/L    ALT (SGPT) 77 (H) <50 U/L    AST (SGOT) 104 (H) 17 - 59 U/L    BILIRUBIN TOTAL 1.2 0.2 - 1.3 mg/dL    PROTEIN TOTAL 8.0 6.3 - 8.2 g/dL    ALBUMIN/GLOBULIN RATIO 1.2 (L) 1.5 - 2.5   TROPONIN-I (Q3H X 3)   Result Value Ref Range    TROPONIN I <0.01 0.00 - 0.03 ng/mL   NT-PROBNP   Result Value Ref Range    NT-PROBNP 31 <=300 pg/mL   MAGNESIUM   Result Value Ref Range    MAGNESIUM 1.4 (L) 1.6 - 2.3 mg/dL   BLOOD GAS W/ LACTATE REFLEX Venous   Result Value Ref Range    %FIO2 (VENOUS) 32.0 %    PH (VENOUS) 7.45 (H) 7.32 - 7.43    PCO2 (VENOUS) 43 41 - 51 mm/Hg  PO2 (VENOUS) 165 35 - 50 mm/Hg    BICARBONATE (VENOUS) 29.0 22.0 - 29.0 mmol/L    BASE EXCESS 5.2 (H) 0.0 - 3.0 mmol/L    O2 SATURATION (VENOUS) 99.5 40.0 - 85.0 %    LACTATE 4.1 (HH) <=1.9 mmol/L   TROPONIN-I (Q3H X 3)   Result Value Ref Range    TROPONIN I <0.01 0.00 - 0.03 ng/mL   VITAMIN B12   Result Value Ref Range    VITAMIN B 12 704 239 - 931 pg/mL   LACTIC ACID - FIRST REFLEX   Result Value Ref Range    LACTIC ACID 4.5 (HH) 0.7 - 2.0 mmol/L   TROPONIN-I (Q3H X 3)   Result Value Ref Range    TROPONIN I <0.01 0.00 - 0.03 ng/mL   LACTIC ACID - SECOND REFLEX   Result Value Ref Range    LACTIC ACID 3.8 (H) 0.7 - 2.0 mmol/L       Imaging Studies:    CT ANGIO CHEST FOR PULMONARY EMBOLUS W IV  CONTRAST   Final Result by Edi, Radresults In (12/05 2952)   1. NO PULMONARY EMBOLUS   2. PATCHY OPACITIES IN THE LEFT UPPER LOBE LIKELY DUE TO PNEUMONIA   3. 1.8 CM OVAL-SHAPED NODULAR DENSITY LEFT UPPER LOBE COULD BE DUE TO PNEUMONIA BUT WARRANTS SHORT-TERM FOLLOW-UP CT SCANNING IN 3 MONTHS TO CONFIRM RESOLUTION AND EXCLUDE A PULMONARY NODULE   4. 9 MM RIGHT UPPER LOBE LUNG NODULE IS NEW COMPARED TO 02/15/2023. RECOMMEND SHORT-TERM FOLLOW-UP CT SCANNING IN 3 MONTHS   5. SUBTLE PERIBRONCHIAL OPACITIES IN THE RIGHT UPPER LOBE COULD BE INFECTIOUS OR INFLAMMATORY IN ETIOLOGY   6. RIGHT MIDDLE LOBE AND RIGHT LOWER LOBE ATELECTASIS         One or more dose reduction techniques were used (e.g., Automated exposure control, adjustment of the mA and/or kV according to patient size, use of iterative reconstruction technique).         Radiologist location ID: [id number]         XR CHEST PA AND LATERAL   Final Result by Edi, Radresults In (12/05 0201)   NO ACUTE FINDINGS.         Radiologist location ID: [id number]             Assessment/Plan:        Active Hospital Problems   (*Primary Problem)     Diagnosis    *Pneumonia    Sepsis (CMS HCC)    Acute hypoxic respiratory failure (CMS HCC)    Vapes nicotine containing substance    History of substance use    Chronic obstructive pulmonary disease (CMS HCC)      Severe sepsis 2/2 acute bacterial PNA from suspected gram +/-/atypical organisms  Lactic acidosis   Meets sepsis criteria given tachycardia, tachypnea, and pulmonary source; severe w/ elevated lactate  COVID and flu ruled out  Continue tx with Zithromax and Unaysn  Received 30 cc/kilogram IV fluid bolus  Blood cultures and sputum cultures ngtd. pending urine legionella, urine strep, and mrsa nares + started bactroban and elevated procalcitonin  CTA chest: IMPRESSION: 1.NO PULMONARY EMBOLUS. 2.PATCHY OPACITIES IN THE LEFT UPPER LOBE LIKELY DUE TO PNEUMONIA. 3.1.8 CM OVAL-SHAPED NODULAR DENSITY LEFT UPPER LOBE COULD BE  DUE TO PNEUMONIA BUT WARRANTS SHORT-TERM FOLLOW-UP CT SCANNING IN 3 MONTHS TO CONFIRM RESOLUTION AND EXCLUDE A PULMONARY NODULE. 4.9 MM RIGHT UPPER LOBE LUNG NODULE IS NEW COMPARED TO 02/15/2023. RECOMMEND SHORT-TERM FOLLOW-UP CT SCANNING IN 3  MONTHS. 5.SUBTLE PERIBRONCHIAL OPACITIES IN THE RIGHT UPPER LOBE COULD BE INFECTIOUS OR INFLAMMATORY IN ETIOLOGY. 6.RIGHT MIDDLE LOBE AND RIGHT LOWER LOBE ATELECTASIS  ID consulted     Acute on chronic COPD exacerbation secondary to bacterial pneumonia  Duoneb treatments q.4 hours   Solu-Medrol 40 mg IV push daily x5 days   Azithromycin 500 IV daily x5 days  Supplemental O2 PRN   Tessalon Perles 100 mg q.8 hours and Mucinex      Acute on chronic hypoxic respiratory failure from above  CTA negative for PE  Continue supplemental O2 therapy and attempt to wean as tolerated. On 2L NC prn at home on 6 here  Duonoebs, steroids     Hypomagnesemia  Received 2 gm IV mag on admission     Macrocytic anemia  Likely r/t alcohol   B12 and folate wnl     Essential HTN uncontrolled  Not on meds started norvasc  IV hydralazine PRN     Lung nodule  Noted on CT  Outpatient surveillance recommended per PCP or pulmonary     Transaminitis   Chronic, stable, secondary to alcohol use and fatty liver     Alcohol use disorder with withdrawal concerns  Last alcoholic drink 12/5  Alcohol cessation advised  CIWA protocol in place  Prn and scheduled ativan      Opioid use disorder  Gets Suboxone off the street, not currently prescribed it     Tobacco use disorder  Smoking cessation discussed   Nicotine patch provided     Carpal tunnel syndrome  Post surgery with Dr. 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     Code status: FULL CODE: ATTEMPT RESUSCITATION/CPR  DVT prophylaxis: Heparin SQ        Dispo: monitor labs and vitals. Gen sug and ID consulted. Monitor breathing. CXR pending. Wean O2 to baseline 3L. Follow cultures. Monitor for signs of withdrawal.       Florestine Avers, MD      Interval update: ct ab/pelvis pending. Po zyvox added

## 2023-10-10 NOTE — Care Plan (Signed)
Problem: Adult Inpatient Plan of Care  Goal: Plan of Care Review  Outcome: Ongoing (see interventions/notes)  Goal: Patient-Specific Goal (Individualized)  Outcome: Ongoing (see interventions/notes)  Goal: Absence of Hospital-Acquired Illness or Injury  Outcome: Ongoing (see interventions/notes)  Intervention: Identify and Manage Fall Risk  Recent Flowsheet Documentation  Taken 10/10/2023 0758 by Mal Misty, RN  Safety Promotion/Fall Prevention:   nonskid shoes/slippers when out of bed   safety round/check completed  Intervention: Prevent Skin Injury  Recent Flowsheet Documentation  Taken 10/10/2023 0758 by Mal Misty, RN  Skin Protection: adhesive use limited  Goal: Optimal Comfort and Wellbeing  Outcome: Ongoing (see interventions/notes)  Intervention: Provide Person-Centered Care  Recent Flowsheet Documentation  Taken 10/10/2023 0758 by Mal Misty, RN  Trust Relationship/Rapport:   care explained   choices provided   emotional support provided  Goal: Rounds/Family Conference  Outcome: Ongoing (see interventions/notes)     Problem: Fall Injury Risk  Goal: Absence of Fall and Fall-Related Injury  Outcome: Ongoing (see interventions/notes)  Intervention: Identify and Manage Contributors  Recent Flowsheet Documentation  Taken 10/10/2023 0758 by Mal Misty, RN  Medication Review/Management: medications reviewed  Intervention: Promote Injury-Free Environment  Recent Flowsheet Documentation  Taken 10/10/2023 0758 by Mal Misty, RN  Safety Promotion/Fall Prevention:   nonskid shoes/slippers when out of bed   safety round/check completed     Problem: Alcohol Withdrawal  Goal: Alcohol Withdrawal Symptom Control  Outcome: Ongoing (see interventions/notes)  Goal: Optimal Neurologic Function  Outcome: Ongoing (see interventions/notes)  Goal: Readiness for Change Identified  Outcome: Ongoing (see interventions/notes)     Problem: Infection  Goal: Absence of Infection Signs and Symptoms  Outcome: Ongoing (see interventions/notes)     Problem:  Pneumonia  Goal: Fluid Balance  Outcome: Ongoing (see interventions/notes)  Goal: Absence of Infection Signs and Symptoms  Outcome: Ongoing (see interventions/notes)  Goal: Effective Oxygenation and Ventilation  Outcome: Ongoing (see interventions/notes)

## 2023-10-10 NOTE — ED Provider Notes (Signed)
[facility]  Emergency Department      Name: [name] [name]  Age and Gender: 57 y.o. male  Date of Birth: 12/13/1965  Date of Service: 10/10/2023   MRN: [id number]  PCP: Cecilie Kicks, DO    SUBJECTIVE:      Chief Complaint   Patient presents with    Shortness of Breath       HPI:  [name] [name] is a 57 y.o. male presenting with productive cough and shortness for breath x4 days.  Patient states he has a history of chronic obstructive pulmonary disease and is having a significantly harder time breathing normal.  Patient does have oxygen at home that he does not often where unless his O2 drops below 88 and he states he has had to wear 2 L consistently over the past 4 days.  States symptoms are worse with lying down or ambulating, increasingly dyspneic on exertion.  He does endorse intermittent fever at home.  Denies chest pain.  States cough is productive of yellow-green sputum.  Denies abdominal pain, vomiting or diarrhea.  Patient has used his home breathing treatment without improvement of symptoms.  Denies sick contacts.  Patient does endorse vaping as well as daily alcohol use and Suboxone.  Patient states he had half a twisted tea before he got here today but typically drinks a 12 pack a day.        Review of Systems:   Review of Systems   Constitutional:  Positive for diaphoresis, fever and malaise/fatigue. Negative for chills.   HENT:  Positive for congestion. Negative for sore throat.    Eyes:  Negative for blurred vision and double vision.   Respiratory:  Positive for cough, sputum production, shortness of breath and wheezing. Negative for hemoptysis.    Cardiovascular:  Positive for orthopnea. Negative for chest pain, palpitations and leg swelling.   Gastrointestinal:  Negative for abdominal pain, constipation, diarrhea, nausea and vomiting.   Genitourinary:  Negative for dysuria, frequency and urgency.   Musculoskeletal:  Negative for falls.   Skin:  Negative for itching and rash.    Neurological:  Negative for dizziness, tingling, tremors, sensory change, speech change, focal weakness, loss of consciousness and weakness.       MEDICAL HISTORY   Below pertinent information reviewed with patient and/or EMR:     Past Medical History  Past Medical History:   Diagnosis Date    Cervical stenosis of spinal canal     Chronic gum disease     RISK OF TEETH FALLING OUT    Chronic obstructive pulmonary disease (CMS HCC) 04/29/2023    Chronic pain     COPD (chronic obstructive pulmonary disease) (CMS HCC)     CVD (cerebrovascular disease)     Exertional dyspnea 04/29/2023    Gait disturbance     Gout     History of anesthesia complications     PATIENT STATED TEETH AS RISK OF BEING KNOCKED OUT DUE TO GUM DISEASE    History of substance use 04/29/2023    Hyperlipidemia     Kidney stones     Laceration of leg 02/2023    Leg weakness, bilateral     Low back pain     Lumbar radiculopathy     MRSA infection 2013    toe surgery    Neck pain     Neck problem     Oxygen dependent     Wears glasses        Past  Surgical History  Past Surgical History:   Procedure Laterality Date    HX CERVICAL SPINE SURGERY  02/18/2023    HX FOOT SURGERY Left     jont implant left toe    HX HERNIA REPAIR Left     inguinal    LITHOTRIPSY      LUMBAR DISC SURGERY  05/2023       Medications  Medications Prior to Admission       Prescriptions    acetaminophen (TYLENOL) 325 mg Oral Tablet    Take 2 Tablets (650 mg total) by mouth Every 6 hours    Patient taking differently:  Take 2 Tablets (650 mg total) by mouth Every 6 hours as needed    albuterol (PROVENTIL) 2.5 mg/0.5 mL Inhalation Solution for Nebulization    Take 0.5 mL (2.5 mg total) by nebulization Three times a day as needed    allopurinoL (ZYLOPRIM) 100 mg Oral Tablet    Take 1 Tablet (100 mg total) by mouth Twice daily    buprenorphine HCl/naloxone HCl (SUBOXONE SL)    Place 2 mg under the tongue Once a day ASKED PATIENT WHAT THE MG WAS ON THIS MEDICATION AND He STATES THAT He  DOES NOT HAVE A PRESCRIPTION FOR IT He BUYS IT OFF THE "[employer]"  It's an 8mg  strip that he cuts into 8 pieces , uses 1-2 pieces at a time    buPROPion (WELLBUTRIN XL) 150 mg extended release 24 hr tablet    Take 1 Tablet (150 mg total) by mouth Once a day Indications: stop smoking    Ibuprofen (MOTRIN) 200 mg Oral Tablet    Take 4 Tablets (800 mg total) by mouth Four times a day as needed for Pain Last dose 11/8            Allergies  Allergies   Allergen Reactions    Naproxen Nausea/ Vomiting       Family History  Family Medical History:       Problem Relation (Age of Onset)    COPD Mother    Seizures Sister            Social History  Social History     Tobacco Use    Smoking status: Former     Current packs/day: 1.00     Types: Cigarettes     Passive exposure: Past    Smokeless tobacco: Former    Tobacco comments:     Vapes daily    Vaping Use    Vaping status: Every Day    Substances: Nicotine   Substance Use Topics    Alcohol use: Yes     Comment: twice weekly    Drug use: Not Currently     Comment: suboxone off black market       OBJECTIVE     ED Triage Vitals [10/10/23 0058]   BP (Non-Invasive) (!) 170/114   Heart Rate (!) 115   Respiratory Rate (!) 32   Temperature 36.8 C (98.3 F)   SpO2 (!) 86 %   Weight (!) 139 kg (307 lb)   Height 1.88 m (6\' 2" )       Physical Exam  Vitals and nursing note reviewed.   Constitutional:       General: He is not in acute distress.     Appearance: He is obese.      Comments: Patient diaphoretic, smells of alcohol   HENT:      Head: Normocephalic and atraumatic.  Eyes:      Extraocular Movements: Extraocular movements intact.   Cardiovascular:      Rate and Rhythm: Regular rhythm. Tachycardia present.      Heart sounds: Normal heart sounds.   Pulmonary:      Effort: Pulmonary effort is normal.      Breath sounds: Wheezing, rhonchi and rales present.      Comments: Patient on 2 L nasal cannula  Abdominal:      General: Abdomen is flat. There is no distension.       Palpations: Abdomen is soft.      Tenderness: There is no abdominal tenderness. There is no guarding.   Musculoskeletal:         General: Normal range of motion.      Cervical back: Normal range of motion and neck supple. No rigidity.   Skin:     General: Skin is warm and dry.   Neurological:      General: No focal deficit present.      Mental Status: He is alert and oriented to person, place, and time.   Psychiatric:         Mood and Affect: Mood normal.         Behavior: Behavior normal.          ASSESSMENT AND PLAN     Diagnostic results:   Labs:   Labs Ordered/Reviewed   COMPREHENSIVE METABOLIC PANEL, NON-FASTING - Abnormal; Notable for the following components:       Result Value    CHLORIDE 97 (*)     CO2 TOTAL 31 (*)     BUN <2 (*)     CREATININE 0.65 (*)     GLUCOSE 127 (*)     ALT (SGPT) 77 (*)     AST (SGOT) 104 (*)     ALBUMIN/GLOBULIN RATIO 1.2 (*)     All other components within normal limits    Narrative:     Estimated Glomerular Filtration Rate (eGFR) is calculated using the CKD-EPI (2021) equation, intended for patients 49 years of age and older. If gender is not documented or "unknown", there will be no eGFR calculation.   CBC WITH DIFF - Abnormal; Notable for the following components:    RBC 4.22 (*)     MCV 103.6 (*)     MCH 34.4 (*)     RDW 16.4 (*)     MPV 7.3 (*)     LYMPHOCYTE % 16 (*)     MONOCYTE % 15 (*)     MONOCYTE # 1.30 (*)     All other components within normal limits   MAGNESIUM - Abnormal; Notable for the following components:    MAGNESIUM 1.4 (*)     All other components within normal limits   LACTIC ACID LEVEL W/ REFLEX FOR LEVEL >2.0 - Abnormal; Notable for the following components:    LACTIC ACID 4.1 (*)     All other components within normal limits   BLOOD GAS W/ LACTATE REFLEX - Abnormal; Notable for the following components:    PH (VENOUS) 7.45 (*)     BASE EXCESS 5.2 (*)     LACTATE 4.1 (*)     All other components within normal limits    Narrative:     Manufacturer does not  recommend venous sample for assessment of patient oxygenation status. A reference range for pO2, Oxyhemoglobin and Oxygen Saturation is provided but abnormal results will not flag in Epic.  TROPONIN-I - Normal   TROPONIN-I - Normal   COVID-19, FLU A/B, RSV RAPID BY PCR - LAB USE ONLY - Normal    Narrative:     Results are for the simultaneous qualitative identification of SARS-CoV-2 (formerly 2019-nCoV), Influenza A, Influenza B, and RSV RNA. These etiologic agents are generally detectable in nasopharyngeal and nasal swabs during the ACUTE PHASE of infection. Hence, this test is intended to be performed on respiratory specimens collected from individuals with signs and symptoms of upper respiratory tract infection who meet Centers for Disease Control and Prevention (CDC) clinical and/or epidemiological criteria for Coronavirus Disease 2019 (COVID-19) testing. CDC COVID-19 criteria for testing on human specimens is available at [facility] webpage information for Healthcare Professionals: Coronavirus Disease 2019 (COVID-19) ([employer]).     False-negative results may occur if the virus has genomic mutations, insertions, deletions, or rearrangements or if performed very early in the course of illness. Otherwise, negative results indicate virus specific RNA targets are not detected, however negative results do not preclude SARS-CoV-2 infection/COVID-19, Influenza, or Respiratory syncytial virus infection. Results should not be used as the sole basis for patient management decisions. Negative results must be combined with clinical observations, patient history, and epidemiological information. If upper respiratory tract infection is still suspected based on exposure history together with other clinical findings, re-testing should be considered.    Test methodology:   Cepheid Xpert Xpress SARS-CoV-2/Flu/RSV Assay real-time polymerase chain reaction (RT-PCR) test on the GeneXpert  Dx and Xpert Xpress systems.   NT-PROBNP - Normal   ADULT ROUTINE BLOOD CULTURE, SET OF 2 BOTTLES (BACTERIA AND YEAST)   ADULT ROUTINE BLOOD CULTURE, SET OF 2 BOTTLES (BACTERIA AND YEAST)   CBC/DIFF    Narrative:     The following orders were created for panel order CBC/DIFF.  Procedure                               Abnormality         Status                     ---------                               -----------         ------                     CBC WITH DIFF[[id number]]                Abnormal            Final result                 Please view results for these tests on the individual orders.   LACTIC ACID - FIRST REFLEX   TROPONIN-I       Imaging:  XR CHEST PA AND LATERAL   Final Result by Edi, Radresults In (12/05 0201)   NO ACUTE FINDINGS.         Radiologist location ID: [id number]           MDM/Course:  ED Course as of 10/10/23 0522   Thu Oct 10, 2023   0102 Heart Rate(!): 115   0102 Respiratory Rate(!): 32   0102 SpO2(!): 86 %   0237 MAGNESIUM(!): 1.4  Ordered    0238 Heart Rate: 98   0242 NT-PROBNP: 31  0242 TROPONIN-I: <0.01   0242 LACTIC ACID(!!): 4.1   0318 Pt refused ABG, VBG ordered   0442 Chest x-ray performed at time of presentation was read as negative for acute findings.  At time of initial presentation I did feel patient's symptoms were secondary to chronic obstructive pulmonary disease exacerbation rather than acute infection requiring IV antibiotics.  Initial lactic was 4.1 which I did feel was likely secondary to patient's breathing treatment which he received prior to arrival as well as history of alcoholism. VBG was then ordered and obtained to compare, patient refused ABG.  Repeat lactic acid was also 4.1 on VBG.  At approximately 4:30 a.m. this morning, vRad report was faxed over, reading the chest CTA which reveals patchy left upper lobe airspace disease consistent with early infiltrates.  At this time patient was started with sepsis protocol and a 30 cc/kilogram bolus of fluids were  ordered secondary to ideal body weight as well as patient was started on Unasyn and doxycycline IV.     Patient presents to the emergency department for evaluation of shortness for breath x4 days.  CBC and CMP are unremarkable for acute findings.  Magnesium at 1.4, supplemented in the emergency department.  Pro BNP within normal limits.  Troponin negative.  Lactic acid initially was 4.1 which was initially thought to be secondary to breathing treatment as well as alcohol use.  VBG was then performed as patient refused ABG, repeat lactate is 4.1.  Chest x-ray was negative but CTA of the chest reveals patchy left upper lobe airspace disease consistent with early infiltrates.  Given this, patient was started on Unasyn and doxycycline in the emergency department and admission was recommended.  Patient is agreeable to plan of care.  Discussed with hospitalist who graciously agrees to admit the patient for further workup and management of symptoms.    Medical Decision Making  Problems Addressed:  Alcohol abuse: acute illness or injury  Chronic obstructive pulmonary disease, unspecified COPD type (CMS HCC): acute illness or injury  Cough, unspecified type: acute illness or injury  Hypoxia: acute illness or injury  Infiltrate noted on imaging study: acute illness or injury  Pulmonary nodule: acute illness or injury  SOB (shortness of breath): acute illness or injury    Amount and/or Complexity of Data Reviewed  Labs: ordered. Decision-making details documented in ED Course.  Radiology: ordered. Decision-making details documented in ED Course.    Risk  OTC drugs.  Prescription drug management.  Parenteral controlled substances.  Decision regarding hospitalization.        Clinical Impression:     Clinical Impression   SOB (shortness of breath) (Primary)   Hypoxia   Chronic obstructive pulmonary disease, unspecified COPD type (CMS HCC)   Infiltrate noted on imaging study   Pulmonary nodule   Cough, unspecified type   Alcohol  abuse       Disposition: Admitted        ED Provider signature:  Elizebeth Koller, [facility]  [facility] Emergency Department          Parts of this patients chart were completed in a retrospective fashion due to simultaneous direct patient care activities in the Emergency Department.

## 2023-10-11 ENCOUNTER — Other Ambulatory Visit (HOSPITAL_COMMUNITY): Payer: Self-pay

## 2023-10-11 ENCOUNTER — Inpatient Hospital Stay (HOSPITAL_COMMUNITY): Payer: MEDICAID

## 2023-10-11 LAB — LACTIC ACID LEVEL W/ REFLEX FOR LEVEL >2.0: LACTIC ACID: 1.8 mmol/L (ref 0.7–2.0)

## 2023-10-11 LAB — BASIC METABOLIC PANEL
ANION GAP: 10 mmol/L (ref 5–19)
BUN/CREA RATIO: 18 (ref 6–20)
BUN: 15 mg/dL (ref 9–20)
CALCIUM: 9.6 mg/dL (ref 8.4–10.2)
CHLORIDE: 99 mmol/L (ref 98–107)
CO2 TOTAL: 29 mmol/L (ref 22–30)
CREATININE: 0.84 mg/dL (ref 0.66–1.20)
ESTIMATED GFR: >60 mL/min/{1.73_m2} (ref >60)
GLUCOSE: 177 mg/dL — ABNORMAL HIGH (ref 74–106)
POTASSIUM: 4.4 mmol/L (ref 3.5–5.1)
SODIUM: 138 mmol/L (ref 137–145)

## 2023-10-11 LAB — CBC WITH DIFF
BASOPHIL #: 0 10*3/uL (ref 0.00–0.20)
BASOPHIL %: 0 % (ref 0–2)
EOSINOPHIL #: 0 10*3/uL (ref 0.00–0.60)
EOSINOPHIL %: 0 % (ref 0–5)
HCT: 38.8 % (ref 36.0–46.0)
HGB: 12.7 g/dL — ABNORMAL LOW (ref 13.9–16.3)
LYMPHOCYTE #: 1.3 10*3/uL (ref 1.10–3.80)
LYMPHOCYTE %: 12 % — ABNORMAL LOW (ref 19–46)
MCH: 34.4 pg — ABNORMAL HIGH (ref 25.4–34.0)
MCHC: 32.6 g/dL (ref 30.0–37.0)
MCV: 105.6 fL — ABNORMAL HIGH (ref 80.0–100.0)
MONOCYTE #: 1.1 10*3/uL — ABNORMAL HIGH (ref 0.10–0.80)
MONOCYTE %: 11 % (ref 4–12)
MPV: 7.8 fL (ref 7.5–11.5)
NEUTROPHIL #: 8.1 10*3/uL — ABNORMAL HIGH (ref 1.80–7.50)
NEUTROPHIL %: 77 % — ABNORMAL HIGH (ref 41–69)
PLATELETS: 269 10*3/uL (ref 130–400)
RBC: 3.67 10*6/uL — ABNORMAL LOW (ref 4.30–5.90)
RDW: 16 % — ABNORMAL HIGH (ref 11.5–14.0)
WBC: 10.5 10*3/uL (ref 4.5–11.5)

## 2023-10-11 LAB — SCAN DIFFERENTIAL
PLATELET ESTIMATE: ADEQUATE
WBC MORPHOLOGY COMMENT: NORMAL

## 2023-10-11 LAB — LACTIC ACID - FIRST REFLEX: LACTIC ACID: 3.5 mmol/L — ABNORMAL HIGH (ref 0.7–2.0)

## 2023-10-11 LAB — LACTIC ACID - SECOND REFLEX: LACTIC ACID: 2.1 mmol/L — ABNORMAL HIGH (ref 0.7–2.0)

## 2023-10-11 LAB — MAGNESIUM: MAGNESIUM: 1.8 mg/dL (ref 1.6–2.3)

## 2023-10-11 MED ORDER — BARIUM SULFATE 81 % (W/W) ORAL POWDER
30.0000 mL | ORAL | Status: AC
Start: 2023-10-11 — End: 2023-10-11
  Administered 2023-10-11: 30 mL via ORAL

## 2023-10-11 MED ORDER — BARIUM SULFATE 40 % (W/V), 29 % (W/W) (1,500 CPS) ORAL SUSPENSION
5.0000 mL | ORAL | Status: AC
Start: 2023-10-11 — End: 2023-10-11
  Administered 2023-10-11: 5 mL via ORAL

## 2023-10-11 MED ORDER — BARIUM SULFATE 40 % (W/V), 30 % (W/W) ORAL SUSPENSION
20.0000 mL | ORAL | Status: AC
Start: 2023-10-11 — End: 2023-10-11
  Administered 2023-10-11: 20 mL via ORAL

## 2023-10-11 MED ORDER — MAGNESIUM OXIDE 400 MG (241.3 MG MAGNESIUM) TABLET
400.0000 mg | ORAL_TABLET | Freq: Every day | ORAL | Status: DC
Start: 2023-10-11 — End: 2023-10-14
  Administered 2023-10-11 – 2023-10-14 (×4): 400 mg via ORAL
  Filled 2023-10-11 (×4): qty 1

## 2023-10-11 MED ORDER — BARIUM SULFATE 40 % (W/V), 30% (W/W) ORAL PASTE
150.0000 mL | PASTE | ORAL | Status: AC
Start: 2023-10-11 — End: 2023-10-11
  Administered 2023-10-11: 150 mL via ORAL

## 2023-10-11 NOTE — Speech Evaluation (Signed)
[facility]  Speech Therapy  [address], [place] [address]  Phone: [phone]  Fax: [phone]    Modified Barium Swallow Study     Patient Name: [name] [name]  Date of Birth: 1966-08-26  Patient MRN: [id number]  Ordering Provider: Dr. Celine Ahr    Conclusions/Summary of MBSS:   Diagnostic statement/summary of study: Pt presents with mild oropharyngeal phase dysphagia characterized by reduced: oral control of bolus, initiation of pharyngeal swallow, velar retraction, hyolaryngeal elevation and excursion. There was intermittent flash penetration with successive sips of thin liquids; otherwise airway protection was maintained. There was vallecular residue from oral stasis, which cleared spontaneously, except one trial pt needed cues to re-swallow. Pt noted to have hardware in cervical spine with report of surgery 4- 5months ago. Pt denied hoarseness or dysphagia post-op. Recommend regular solids and thin liquids with aspiration precautions as there is concern for incoordination of respiration/swallowing. See individual trials below for specific details of the study.   Treatment Diagnosis:  Level 5: Mild  Oropharyngeal Dysphagia   Rehab Potential: Good       Plan & recommendations based on this MBSS:   Diet Recommendations: Regular and Thin/Level 0  Aspiration Precautions/Compensatory Strategies: Elevate HOB to a 90 degree seated position, Single sips, Small bites, Re-swallow with drinks , Swallow each bite prior to taking next bite, Take breaks with SOB , and Reflux precautions/remain upright after meals for 30 mins   Medication administration: Pills whole 1 at a time w/ recommended liquid consistency  Feeding Assistance: Assistance w/ tray set up and/or positioning and Monitor and assist at meals as needed 2/2 possible confusion   General plan of care: Therapeutic exercises to strengthen the swallowing mechanism , Compensatory strategies, Aspiration precautions, and Patient/family  education/training  Recommended swallowing exercises: Hard/effortful swallow, Lingual Pullbacks, and Mendelsohn Maneuver   Recommended Diagnostics: N/A  Results & Recommendations/ Education provided to: Patient and Nurse regarding MBS results/recommendations  Is dysphagia intervention warranted?: Yes  Frequency of treatment: 2-3 days/week until goals met, discharged from this facility, or a plateau in swallow function w/ no further expectation in functional progress   Patient would benefit from the following referrals: OT and PT   Anticipated discharge destination:  Home w/ outpatient rehab; ST will obtain swallow tx orders as indicated once d/c.    History & Current Problem      [name] [name] is a 57 y.o. male came for an IP MBS to objectively assess swallow function. Pt came in with cc of respiratory symptoms. Pt has been coughing and SOB for the last week. Pt endorses sore throat 2/2 cough. CXR was negative. CTA chest was negative for PE, but showed PNA (patchy opacities in the LUL likely due to PNA and RML and RLL atelectasis).     Pt has has known h/o alcohol abuse with pt feeling like he is going through withdrawals.     Pt denied any s/s of dysphagia or aspiration. He also denied recurrent PNA. Last bout of PNA was 40 years ago.     Does this patient have a history of Head and Neck Cancer? (including esophageal): No      Problems addressed this visit    ICD-10-CM    1. SOB (shortness of breath)  R06.02       2. Hypoxia  R09.02       3. Chronic obstructive pulmonary disease, unspecified COPD type (CMS HCC)  J44.9       4.  Infiltrate noted on imaging study  R93.89       5. Pulmonary nodule  R91.1       6. Cough, unspecified type  R05.9       7. Alcohol abuse  F10.10           Recent CXR:   Recent Results (from the past 720 hour(s))   XR AP MOBILE CHEST    Collection Time: 10/10/23  1:50 PM    Narrative    Chrissie Noa T Bolz    RADIOLOGIST: Mickey Farber, MD    XR AP MOBILE CHEST performed on 10/10/2023 1:50  PM    CLINICAL HISTORY: f/u pna.  reeval pneumonia status    TECHNIQUE: Frontal view of the chest.    COMPARISON:  Prior exam from today    FINDINGS:    Heart size is mildly enlarged.     As before there are left perihilar and right basilar infiltrates with elevation of the right diaphragm   There are several old right rib fracture deformities        Impression    Persistent bilateral infiltrates/atelectasis similar to the prior exam        Radiologist location ID: [id number]     XR CHEST PA AND LATERAL    Collection Time: 10/10/23  1:55 AM    Narrative    Chrissie Noa T Troiani    RADIOLOGIST: Elna Breslow, MD    XR CHEST PA AND LATERAL performed on 10/10/2023 1:55 AM    CLINICAL HISTORY: SOB.  c/o cough, dyspnea prev: pneumonia    TECHNIQUE: Frontal and lateral views of the chest.    COMPARISON:  05/06/2023    FINDINGS:    The heart size is normal.  The mediastinal contour is unremarkable.  Atelectasis right lung base. There is some airspace opacity left midlung which could be atelectasis or pneumonia.   The bones are unremarkable.        Impression    NO ACUTE FINDINGS.      Radiologist location ID: [id number]         Past Medical History:   Diagnosis Date    Cervical stenosis of spinal canal     Chronic gum disease     RISK OF TEETH FALLING OUT    Chronic obstructive pulmonary disease (CMS HCC) 04/29/2023    Chronic pain     COPD (chronic obstructive pulmonary disease) (CMS HCC)     CVD (cerebrovascular disease)     Exertional dyspnea 04/29/2023    Gait disturbance     Gout     History of anesthesia complications     PATIENT STATED TEETH AS RISK OF BEING KNOCKED OUT DUE TO GUM DISEASE    History of substance use 04/29/2023    Hyperlipidemia     Kidney stones     Laceration of leg 02/2023    Leg weakness, bilateral     Low back pain     Lumbar radiculopathy     MRSA (methicillin resistant Staphylococcus aureus) 10/10/2023    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/10/23    MRSA infection 2013    toe surgery    Neck pain     Neck  problem     Oxygen dependent     Wears glasses        Subjective:   Patient's diet prior to this hospital admission/illness (if known): Regular and Thin/Level 0  Current diet consistency at time of MBS: Regular and Thin/Level 0  Was  a Bedside Swallowing Evaluation completed prior to this study: No, Reason: physician ordered MBS d/t concern for aspiration   Problems identified or reported warranting MBS: pneumonia suspicious for aspiration   Pertinent diagnosis related to dysphagia: Feeding difficulties and mismanagement       Objective:   Oxygenation: HFNC 8 liters with baseline being 2-3L PRN per pt. Pt feels he does not need 8L of O2 as baseline SpO2 is 88-89%. Pt reported with 8L he is at 94-96% and that he needs to be tested at his baseline.   Vocal Quality: Age appropriate   Dentition: Natural Dentition with Missing Posterior Dentition  Does patient appear to be managing oral secretions? yes  Communication level: Communicates without difficulty  Mental Status:  Pt was alert, but appeared confused and some times was confrontational with staff.   Feeding ability during MBS: Independent      Liquid Trials   Liquid Consistency : Thin/Level 0  Route/Volume of Presentation: TSP x2 (5 mL), Single cup sip x1, and Successive cup sips x7  Oral Phase (Specify if route/volume affected a component):  Lip closure: Adequate/No bolus escape  Tongue control during bolus hold: Escape of bolus to lateral buccal cavity/floor of mouth  Bolus transport/lingual motion: WFL  Oral residue: Residue collection on oral structures  Pharyngeal Phase (Specify if route/volume affected a component):  Initiation of pharyngeal swallow: Swallow initiated prior to bolus reaching the valleculae   Soft palate elevation: Trace column of contrast or air between soft palate and pharyngeal wall  Laryngeal elevation: Partial elevation-intermittent  Anterior hyoid excursion: Partial anterior movement  Epiglottic movement: Complete inversion  Laryngeal  vestibule closure: Complete; no Contrast  in laryngeal vestibule  Pharyngeal stripping wave: Present-complete  Pharyngeal contraction (AP view only): NA- Did not assess in AP view  Pharyngoesophageal segment opening: Complete distension; no obstruction of bolus flow  Tongue base retraction: Trace column of Contrast between the tongue base and the posterior pharyngeal wall  Vallecular residue: Mild collection of bolus/contrast -occurred frequently from oral stasis and cleared spontaneous with subsequent swallow  Pyriform sinus residue: No residue/complete clearance  Posterior pharyngeal wall residue: No residue/complete clearance  Were any strategies attempted and were they affective with this consistency?: No   Penetration/Aspiration Scale (please note which trial had which result if multiple are chosen): 2. Penetration, contrast remains above the vocal folds, subsequently ejected -This occurred frequently when taking successive sips with cough present and likely unrelated to swallowing  Esophageal Phase (AP view only):  NA- Did not assess w/ AP view    Liquid Consistency : Nectar thick/Mildly Thick/Level 2  Route/Volume of Presentation: TSP x1 (5 mL), Single cup sip x1, and Successive cup sips x3  Oral Phase (Specify if route/volume affected a component):  Lip closure: Adequate/No bolus escape  Tongue control during bolus hold: Escape of bolus to lateral buccal cavity/floor of mouth  Bolus transport/lingual motion: WFL  Oral residue: Residue collection on oral structures  Pharyngeal Phase (Specify if route/volume affected a component):  Initiation of pharyngeal swallow: Swallow initiated w/ bolus head in the valleculae  Soft palate elevation: Trace column of contrast or air between soft palate and pharyngeal wall  Laryngeal elevation: Partial elevation-intermittently  Anterior hyoid excursion: Complete anterior movement  Epiglottic movement: Complete inversion  Laryngeal vestibule closure: Complete; no Contrast  in  laryngeal vestibule  Pharyngeal stripping wave: Present-complete  Pharyngeal contraction (AP view only): Complete  Pharyngoesophageal segment opening: Complete distension; no obstruction of bolus flow  Tongue base retraction:  Trace column of Contrast between the tongue base and the posterior pharyngeal wall  Vallecular residue: Mild collection of bolus/contrast -occurred frequently from oral stasis with cue needed x1 to clear bolus from the valleculae when taking successive sips; spontaneous re-swallow occurred with remainder of trials.  Pyriform sinus residue: No residue/complete clearance  Posterior pharyngeal wall residue: No residue/complete clearance  Were any strategies attempted and were they affective with this consistency?: Re-swallow (cued and effective)  Penetration/Aspiration Scale (please note which trial had which result if multiple are chosen): 1. Material does not enter airway  Esophageal Phase (AP view only):  Complete clearance (material was present in the proximal esophagus with secondary swallow of puree)    Liquid Consistency : Honey thick/Moderately Thick/Level 3  Route/Volume of Presentation: TSP x1  Oral Phase (Specify if route/volume affected a component):  Lip closure: Adequate/No bolus escape  Tongue control during bolus hold: Escape of bolus to lateral buccal cavity/floor of mouth  Bolus transport/lingual motion: WFL  Oral residue: Residue collection on oral structures-cleared spontaneously with subsequent swallow  Pharyngeal Phase (Specify if route/volume affected a component):  Initiation of pharyngeal swallow: Swallow initiated prior to bolus reaching the valleculae   Soft palate elevation: Trace column of contrast or air between soft palate and pharyngeal wall  Laryngeal elevation: Complete elevation  Anterior hyoid excursion: Partial anterior movement  Epiglottic movement: Complete inversion  Laryngeal vestibule closure: Complete; no Contrast  in laryngeal vestibule  Pharyngeal  stripping wave: Present-complete  Pharyngeal contraction (AP view only): NA- Did not assess in AP view  Pharyngoesophageal segment opening: Complete distension; no obstruction of bolus flow  Tongue base retraction: Trace column of Contrast between the tongue base and the posterior pharyngeal wall  Vallecular residue: No residue/complete clearance  Pyriform sinus residue: No residue/complete clearance  Posterior pharyngeal wall residue: No residue/complete clearance  Were any strategies attempted and were they affective with this consistency?: No   Penetration/Aspiration Scale (please note which trial had which result if multiple are chosen): 1. Material does not enter airway  Esophageal Phase (AP view only):  NA- Did not assess w/ AP view     Solid Trials   Solid Consistency: puree  Route/Volume of Presentation: TSP x1 of pudding  Oral Phase (Specify if route/volume affected a component):  Lip closure: Adequate/No bolus escape  Bolus transport/lingual motion: WFL  Oral residue: Residue collection on oral structures-cleared spontaneously with subsequent swallow  Pharyngeal Phase (Specify if route/volume affected a component):  Initiation of pharyngeal swallow: Swallow initiated w/ bolus head in the valleculae  Soft palate elevation: Intact: no bolus between soft palate and pharyngeal wall  Laryngeal elevation: Partial elevation  Anterior hyoid excursion: Partial anterior movement  Epiglottic movement: Complete inversion  Laryngeal vestibule closure: Complete; no Contrast  in laryngeal vestibule  Pharyngeal stripping wave: Present-complete  Pharyngeal contraction (AP view only): Complete  Pharyngoesophageal segment opening: Complete distension; no obstruction of bolus flow  Tongue base retraction: Trace column of Contrast between the tongue base and the posterior pharyngeal wall  Vallecular residue: No residue/complete clearance  Pyriform sinus residue: No residue/complete clearance  Posterior pharyngeal wall residue:  No residue/complete clearance  Were any strategies attempted and were they affective with this consistency?: No   Penetration/Aspiration Scale (please note which trial had which result if multiple are chosen): 1. Material does not enter airway  Esophageal Phase (AP view only): Complete clearance    Solid Consistency: regular  Route/Volume of Presentation:  1/2 cookie with pudding barium coating  Oral Phase (Specify if route/volume affected a component):  Lip closure: Adequate/No bolus escape  Bolus preparation/mastication: Slow and prolonged mastication, but w/ complete recollection of the bolus   Bolus transport/lingual motion: WFL  Oral residue: Complete clearance  Pharyngeal Phase (Specify if route/volume affected a component):  Initiation of pharyngeal swallow: Swallow initiated w/ bolus head in the valleculae  Soft palate elevation: Intact: no bolus between soft palate and pharyngeal wall  Laryngeal elevation: Partial elevation  Anterior hyoid excursion: Partial anterior movement  Epiglottic movement: Complete inversion  Laryngeal vestibule closure: Complete; no Contrast  in laryngeal vestibule  Pharyngeal stripping wave: Present-complete  Pharyngeal contraction (AP view only): NA- Did not assess in AP view  Pharyngoesophageal segment opening: Complete distension; no obstruction of bolus flow  Tongue base retraction: Intact; no contrast between the tongue base and the posterior pharyngeal wall  Vallecular residue: No residue/complete clearance  Pyriform sinus residue: No residue/complete clearance  Posterior pharyngeal wall residue: No residue/complete clearance  Were any strategies attempted and were they affective with this consistency?: No   Penetration/Aspiration Scale (please note which trial had which result if multiple are chosen): 1. Material does not enter airway  Esophageal Phase (AP view only): NA- Did not assess w/ AP view      Goals:   Short Term Dysphagia Goals: Pt was will use compensatory  strategies while eating to improve breathing/swallowing coordination and decrease risk for aspiration in 9/10 trials , Pt. will complete 8/10 Mendelsohn Maneuvers , and Pt. will complete 8/10 Effortful swallows     Long Term Dysphagia Goals: Clients will maintain adequate hydration/nutrition with optimum safety and efficiency of swallowing function P.O. intake without overt s/s of aspiration for the highest appropriate diet level and Client will utilize compensatory strategies with optimum safety and efficiency of swallowing function on P.O. intake without overt s/s of aspiration for the highest appropriate diet level      Therapist:   Conception Chancy, SLP      MBS Evaluation Time: 16 minutes

## 2023-10-11 NOTE — Consults (Signed)
GASTROENTEROLOGY INITIAL CONSULT      Patient name:  [name] [name]  MRN:  [id number]  DOB:  Dec 09, 1965  DATE:  10/11/2023  PCP: Cecilie Kicks, DO    Chief Complaint/reason for consult:  Fatty liver, pancreas cyst    HPI: [name] [name] is a 57 y.o. male who presents with pneumonia and respiratory insufficiency.  Also going through alcohol withdrawal.  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  Outpatient MRI was recommended for that.  Patient denies any previous known liver disease.  He does have a long history of alcohol abuse.  He denies jaundice, encephalopathy, ascites, or GI bleeding.  No significant abdominal pain, nausea, vomiting.    Past Medical History:   Diagnosis Date    Cervical stenosis of spinal canal     Chronic gum disease     RISK OF TEETH FALLING OUT    Chronic obstructive pulmonary disease (CMS HCC) 04/29/2023    Chronic pain     COPD (chronic obstructive pulmonary disease) (CMS HCC)     CVD (cerebrovascular disease)     Exertional dyspnea 04/29/2023    Gait disturbance     Gout     History of anesthesia complications     PATIENT STATED TEETH AS RISK OF BEING KNOCKED OUT DUE TO GUM DISEASE    History of substance use 04/29/2023    Hyperlipidemia     Kidney stones     Laceration of leg 02/2023    Leg weakness, bilateral     Low back pain     Lumbar radiculopathy     MRSA (methicillin resistant Staphylococcus aureus) 10/10/2023    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/10/23    MRSA infection 2013    toe surgery    Neck pain     Neck problem     Oxygen dependent     Wears glasses          Past Surgical History:   Procedure Laterality Date    HX CERVICAL SPINE SURGERY  02/18/2023    HX FOOT SURGERY Left     jont implant left toe    HX HERNIA REPAIR Left     inguinal    LITHOTRIPSY      LUMBAR DISC SURGERY  05/2023         No current outpatient medications on file.     Allergies   Allergen Reactions    Naproxen Nausea/ Vomiting     Social History     Socioeconomic History    Marital status:  Divorced   Tobacco Use    Smoking status: Former     Current packs/day: 1.00     Types: Cigarettes     Passive exposure: Past    Smokeless tobacco: Former    Tobacco comments:     Vapes daily    Vaping Use    Vaping status: Every Day    Substances: Nicotine   Substance and Sexual Activity    Alcohol use: Yes     Comment: drinks daily whatever he can get    Drug use: Not Currently     Comment: suboxone off black market    Sexual activity: Not Currently   Other Topics Concern    Ability to Walk 1 Flight of Steps without SOB/CP No    Ability to Walk 2 Flight of Steps without SOB/CP No    Ability To Do Own ADL's Yes     Social Determinants of  Health     Social Connections: Low Risk  (10/10/2023)    Social Connections     SDOH Social Isolation: 5 or more times a week     Family Medical History:       Problem Relation (Age of Onset)    COPD Mother    Seizures Sister              ROS: Other than HPI all other ROS is negative    PHYSICAL EXAM  Temperature: 36.8 C (98.3 F)  Heart Rate: (!) 119  Respiratory Rate: 16  BP (Non-Invasive): (!) 161/110  SpO2: 92 %  General: Alert and oriented in no acute distress . he does seem to be going through withdrawal.  HEENT: No scleral icterus  Cardiovascular: Heart is regular rate and rhythm  Lungs: Clear to auscultation bilaterally with no crackles or wheezes.   Abdomen: Soft and nontender, nondistended with good bowel sounds.   Musculoskeletal: No pitting edema  Neurologic: No focal neurologic deficits  Lymphatic: No palpable lymphadenopathy  Skin: No rash  Psychiatric: Mood and affect are appropriate     Results for orders placed or performed during the hospital encounter of 10/10/23 (from the past 24 hour(s))   LACTIC ACID - SECOND REFLEX   Result Value Ref Range    LACTIC ACID 4.9 (HH) 0.7 - 2.0 mmol/L   EXTRA TUBES    Narrative    The following orders were created for panel order EXTRA TUBES.  Procedure                               Abnormality         Status                      ---------                               -----------         ------                     GOLD TOP YQIH[[id number]]                                    Final result                 Please view results for these tests on the individual orders.   GOLD TOP TUBE   Result Value Ref Range    RAINBOW/EXTRA TUBE AUTO RESULT Yes    LACTIC ACID LEVEL W/ REFLEX FOR LEVEL >2.0   Result Value Ref Range    LACTIC ACID 3.3 (H) 0.7 - 2.0 mmol/L   LACTIC ACID - FIRST REFLEX   Result Value Ref Range    LACTIC ACID 3.5 (H) 0.7 - 2.0 mmol/L   CBC/DIFF    Narrative    The following orders were created for panel order CBC/DIFF.  Procedure                               Abnormality         Status                     ---------                               -----------         ------  CBC WITH DIFF[[id number]]                Abnormal            Final result                 Please view results for these tests on the individual orders.   BASIC METABOLIC PANEL, NON-FASTING   Result Value Ref Range    SODIUM 138 137 - 145 mmol/L    POTASSIUM 4.4 3.5 - 5.1 mmol/L    CHLORIDE 99 98 - 107 mmol/L    CO2 TOTAL 29 22 - 30 mmol/L    ANION GAP 10 5 - 19 mmol/L    CALCIUM 9.6 8.4 - 10.2 mg/dL    GLUCOSE 161 (H) 74 - 106 mg/dL    BUN 15 9 - 20 mg/dL    CREATININE 0.96 0.45 - 1.20 mg/dL    BUN/CREA RATIO 18 6 - 20    ESTIMATED GFR >60 >60 mL/min/1.23m^2    Narrative    Estimated Glomerular Filtration Rate (eGFR) is calculated using the CKD-EPI (2021) equation, intended for patients 96 years of age and older. If gender is not documented or "unknown", there will be no eGFR calculation.   MAGNESIUM - AM ONCE   Result Value Ref Range    MAGNESIUM 1.8 1.6 - 2.3 mg/dL   CBC WITH DIFF   Result Value Ref Range    WBC 10.5 4.5 - 11.5 x10^3/uL    RBC 3.67 (L) 4.30 - 5.90 x10^6/uL    HGB 12.7 (L) 13.9 - 16.3 g/dL    HCT 40.9 81.1 - 91.4 %    MCV 105.6 (H) 80.0 - 100.0 fL    MCH 34.4 (H) 25.4 - 34.0 pg    MCHC 32.6 30.0 - 37.0 g/dL    RDW 78.2 (H) 95.6  - 14.0 %    PLATELETS 269 130 - 400 x10^3/uL    MPV 7.8 7.5 - 11.5 fL    NEUTROPHIL % 77 (H) 41 - 69 %    LYMPHOCYTE % 12 (L) 19 - 46 %    MONOCYTE % 11 4 - 12 %    EOSINOPHIL % 0 0 - 5 %    BASOPHIL % 0 0 - 2 %    NEUTROPHIL # 8.10 (H) 1.80 - 7.50 x10^3/uL    LYMPHOCYTE # 1.30 1.10 - 3.80 x10^3/uL    MONOCYTE # 1.10 (H) 0.10 - 0.80 x10^3/uL    EOSINOPHIL # 0.00 0.00 - 0.60 x10^3/uL    BASOPHIL # 0.00 0.00 - 0.20 x10^3/uL   SCAN DIFFERENTIAL   Result Value Ref Range    PLATELET ESTIMATE Adequate     ANISOCYTOSIS Present (A) Absent    MACROCYTOSIS Present (A) Absent    POLYCHROMASIA Present (A) Absent    STOMATOCYTES Present (A) Absent    WBC MORPHOLOGY COMMENT Normal    LACTIC ACID - SECOND REFLEX   Result Value Ref Range    LACTIC ACID 2.1 (H) 0.7 - 2.0 mmol/L   LACTIC ACID LEVEL W/ REFLEX FOR LEVEL >2.0   Result Value Ref Range    LACTIC ACID 1.8 0.7 - 2.0 mmol/L             ASSESSMENT :   1. Pneumonia and respiratory insufficiency   2. 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  3. Active alcohol withdrawal   4. 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  Needs outpatient MRI.      RECOMMENDATIONS :  1. Management of pneumonia per primary team  2. Management of alcohol withdrawal per primary team   3. 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  No acute GI/liver issues in need of inpatient workup at this time.      GI will sign off.  Please re-consult if inpatient GI assistance is needed.  Dr. Elio Forget is covering this weekend and can be contacted if needed.        Kaedence Connelly T. Urho Rio, M.D.  Gastroenterology

## 2023-10-11 NOTE — Progress Notes (Addendum)
[facility]       HOSPITALIST PROGRESS NOTE      Date of Service:  10/11/2023  [name] [name], [name] [name], 57 y.o. male  Date of Admission:  10/10/2023  Date of Birth:  10-Apr-1966  PCP: Cecilie Kicks, DO    Chief Complaint: pneumonia, sepsis     Subjective:  patient is stable. On 4L NC. Baseline 2L NC prn. On zosyn, zyvox and azithro. Denies any symptoms at this time. Having withdrawal symptoms. ID consulted    Review of systems other than the above were negative     Current Facility-Administered Medications   Medication Dose Route Frequency    acetaminophen (TYLENOL) tablet  650 mg Oral Q6H PRN    allopurinol (ZYLOPRIM) tablet  100 mg Oral 2x/day    amLODIPine (NORVASC) tablet  5 mg Oral Daily    azithromycin (ZITHROMAX) 500 mg in NS 250 mL IVPB  500 mg Intravenous Q24H    benzonatate (TESSALON) capsule  100 mg Oral Q8H    budesonide (PULMICORT RESPULES) 0.5 mg/2 mL nebulizer suspension  0.5 mg Nebulization 2x/day    buPROPion (WELLBUTRIN XL) 24 hr extended release tablet  150 mg Oral Daily    diphenhydrAMINE (BENADRYL) capsule  25 mg Oral HS PRN    docusate sodium (COLACE) capsule  100 mg Oral 2x/day PRN    guaiFENesin (MUCINEX) extended release tablet - for cough (expectorant)  600 mg Oral 2x/day    heparin 5,000 unit/mL injection  5,000 Units Subcutaneous Q8HRS    hydrALAZINE (APRESOLINE) injection 5 mg  5 mg Intravenous Q6H PRN    ibuprofen (MOTRIN) tablet  800 mg Oral 4x/day PRN    ipratropium-albuterol 0.5 mg-3 mg(2.5 mg base)/3 mL Solution for Nebulization  3 mL Nebulization Q4H    linezolid (ZYVOX) tablet  600 mg Oral 2x/day    LORazepam (ATIVAN) tablet  1 mg Oral Q2H PRN    Or    LORazepam (ATIVAN) tablet  2 mg Oral Q2H PRN    LORazepam (ATIVAN) tablet  2 mg Oral Q8H    [START ON 10/12/2023] LORazepam (ATIVAN) tablet  1 mg Oral Q8H    [START ON 10/14/2023] LORazepam (ATIVAN) tablet  0.5 mg Oral Q8H    methylPREDNISolone sod succ (SOLU-medrol) 40 mg/mL injection  40 mg Intravenous Daily    mupirocin (BACTROBAN)  2% topical ointment   Apply Topically 2x/day    nicotine (NICODERM CQ) transdermal patch (mg/24 hr)  21 mg Transdermal Daily    NS flush syringe  10 mL Intracatheter Q8HRS    NS flush syringe  10 mL Intracatheter Q1H PRN    NS premix infusion   Intravenous Continuous    ondansetron (ZOFRAN) 2 mg/mL injection  4 mg Intravenous Q6H PRN    piperacillin-tazobactam (ZOSYN) 4.5 g in iso-osmotic 100 mL premix IVPB  4.5 g Intravenous Q8H    thiamine-vitamin B1 tablet  100 mg Oral Daily        Allergies   Allergen Reactions    Naproxen Nausea/ Vomiting             Filed Vitals:    10/10/23 2258 10/11/23 0313 10/11/23 0404 10/11/23 0405   BP:    (!) 147/89   Pulse:    (!) 120   Resp:       Temp:   36.8 C (98.3 F)    SpO2: 91% (!) 89%  95%     O2 delivery: Nasal Cannula (3L)      Physical Exam:  General:  no acute distress, on 4L NC, 2L prn baseline. pleasant   Skin: no lesions or rashes   HEENT: PERRLA, EOMI, NC/AT, no scleral icterus. Normal neck, no adenopathy, no thyromegaly, no mass, normal lips, no nasal discharge.   Respiratory:  normal breathing, wheezing and rhonchis b/l no respiratory distress improving. Diminished   Cardiovascular:  RRR, no murmur, no edema, pulses intact  Gastrointestinal: normal abdomen, normal active bowel sounds. No mass or tenderness.   Musculoskeletal: no cyanosis or edema.   Neurological: AAO x 3, normal strength and motor function  Genitourinary:  No incontinence or Foley catheter   Psych: no depression or anxiety        Laboratory Data:     Results for orders placed or performed during the hospital encounter of 10/10/23 (from the past 24 hour(s))   LACTIC ACID LEVEL W/ REFLEX FOR LEVEL >2.0   Result Value Ref Range    LACTIC ACID 3.4 (H) 0.7 - 2.0 mmol/L   GOLD TOP TUBE   Result Value Ref Range    RAINBOW/EXTRA TUBE AUTO RESULT Yes    LAVENDER TOP TUBE   Result Value Ref Range    RAINBOW/EXTRA TUBE AUTO RESULT Yes    MRSA SCREEN, PCR, RAPID    Specimen: Nasal Swab   Result Value Ref Range     MRSA COLONIZATION SCREEN Positive (A) Negative   LACTIC ACID - FIRST REFLEX   Result Value Ref Range    LACTIC ACID 3.9 (H) 0.7 - 2.0 mmol/L   LEGIONELLA URINE ANTIGEN    Specimen: Urine, Site not specified   Result Value Ref Range    LEGIONELLA ANTIGEN Negative Negative, Indeterminate   STREPTOCOCCUS PNEUMONIAE ANTIGEN,URINE    Specimen: Urine, Site not specified   Result Value Ref Range    S.PNEUMONIA ANTIGEN Negative Negative, Indeterminate   LACTIC ACID - SECOND REFLEX   Result Value Ref Range    LACTIC ACID 4.9 (HH) 0.7 - 2.0 mmol/L   GOLD TOP TUBE   Result Value Ref Range    RAINBOW/EXTRA TUBE AUTO RESULT Yes    LACTIC ACID LEVEL W/ REFLEX FOR LEVEL >2.0   Result Value Ref Range    LACTIC ACID 3.3 (H) 0.7 - 2.0 mmol/L   BASIC METABOLIC PANEL, NON-FASTING   Result Value Ref Range    SODIUM 138 137 - 145 mmol/L    POTASSIUM 4.4 3.5 - 5.1 mmol/L    CHLORIDE 99 98 - 107 mmol/L    CO2 TOTAL 29 22 - 30 mmol/L    ANION GAP 10 5 - 19 mmol/L    CALCIUM 9.6 8.4 - 10.2 mg/dL    GLUCOSE 469 (H) 74 - 106 mg/dL    BUN 15 9 - 20 mg/dL    CREATININE 6.29 5.28 - 1.20 mg/dL    BUN/CREA RATIO 18 6 - 20    ESTIMATED GFR >60 >60 mL/min/1.34m^2   MAGNESIUM - AM ONCE   Result Value Ref Range    MAGNESIUM 1.8 1.6 - 2.3 mg/dL   CBC WITH DIFF   Result Value Ref Range    WBC 10.5 4.5 - 11.5 x10^3/uL    RBC 3.67 (L) 4.30 - 5.90 x10^6/uL    HGB 12.7 (L) 13.9 - 16.3 g/dL    HCT 41.3 24.4 - 01.0 %    MCV 105.6 (H) 80.0 - 100.0 fL    MCH 34.4 (H) 25.4 - 34.0 pg    MCHC 32.6 30.0 - 37.0 g/dL  RDW 16.0 (H) 11.5 - 14.0 %    PLATELETS 269 130 - 400 x10^3/uL    MPV 7.8 7.5 - 11.5 fL    NEUTROPHIL % 77 (H) 41 - 69 %    LYMPHOCYTE % 12 (L) 19 - 46 %    MONOCYTE % 11 4 - 12 %    EOSINOPHIL % 0 0 - 5 %    BASOPHIL % 0 0 - 2 %    NEUTROPHIL # 8.10 (H) 1.80 - 7.50 x10^3/uL    LYMPHOCYTE # 1.30 1.10 - 3.80 x10^3/uL    MONOCYTE # 1.10 (H) 0.10 - 0.80 x10^3/uL    EOSINOPHIL # 0.00 0.00 - 0.60 x10^3/uL    BASOPHIL # 0.00 0.00 - 0.20 x10^3/uL   SCAN  DIFFERENTIAL   Result Value Ref Range    PLATELET ESTIMATE Adequate     ANISOCYTOSIS Present (A) Absent    MACROCYTOSIS Present (A) Absent    POLYCHROMASIA Present (A) Absent    STOMATOCYTES Present (A) Absent    WBC MORPHOLOGY COMMENT Normal    LACTIC ACID - FIRST REFLEX   Result Value Ref Range    LACTIC ACID 3.5 (H) 0.7 - 2.0 mmol/L   LACTIC ACID - SECOND REFLEX   Result Value Ref Range    LACTIC ACID 2.1 (H) 0.7 - 2.0 mmol/L       Imaging Studies:    FLUORO ESOPHAGRAM,MODIFIED SWALLOW (SPEECH PERFORMED)   Final Result by Herminio Commons, RT (R) (12/06 0934)      CT ABDOMEN PELVIS WO IV CONTRAST   Final Result by Edi, Radresults In (12/06 0107)   Enlarged fatty infiltrated liver. Atelectasis in the lung bases.      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 Recommend outpatient MRI of the pancreas are better characterization.          One or more dose reduction techniques were used (e.g., Automated exposure control, adjustment of the mA and/or kV according to patient size, use of iterative reconstruction technique).         Radiologist location ID: [id number]         XR AP MOBILE CHEST   Final Result by Edi, Radresults In (12/05 1354)   Persistent bilateral infiltrates/atelectasis similar to the prior exam            Radiologist location ID: [id number]         CT ANGIO CHEST FOR PULMONARY EMBOLUS W IV CONTRAST   Final Result by Edi, Radresults In (12/05 8119)   1. NO PULMONARY EMBOLUS   2. PATCHY OPACITIES IN THE LEFT UPPER LOBE LIKELY DUE TO PNEUMONIA   3. 1.8 CM OVAL-SHAPED NODULAR DENSITY LEFT UPPER LOBE COULD BE DUE TO PNEUMONIA BUT WARRANTS SHORT-TERM FOLLOW-UP CT SCANNING IN 3 MONTHS TO CONFIRM RESOLUTION AND EXCLUDE A PULMONARY NODULE   4. 9 MM RIGHT UPPER LOBE LUNG NODULE IS NEW COMPARED TO 02/15/2023. RECOMMEND SHORT-TERM FOLLOW-UP CT SCANNING IN 3 MONTHS   5. SUBTLE PERIBRONCHIAL OPACITIES IN THE RIGHT UPPER LOBE COULD BE INFECTIOUS OR INFLAMMATORY IN ETIOLOGY   6. RIGHT MIDDLE LOBE  AND RIGHT LOWER LOBE ATELECTASIS         One or more dose reduction techniques were used (e.g., Automated exposure control, adjustment of the mA and/or kV according to patient size, use of iterative reconstruction technique).         Radiologist location ID: [id number]         XR CHEST  PA AND LATERAL   Final Result by Edi, Radresults In (12/05 0201)   NO ACUTE FINDINGS.         Radiologist location ID: [id number]             Assessment/Plan:        Active Hospital Problems   (*Primary Problem)     Diagnosis    *Pneumonia    Sepsis (CMS HCC)    Acute hypoxic respiratory failure (CMS HCC)    Vapes nicotine containing substance    History of substance use    Chronic obstructive pulmonary disease (CMS HCC)      Severe sepsis 2/2 acute bacterial PNA from suspected gram +/-/atypical organisms  Lactic acidosis -improving  Meets sepsis criteria given tachycardia, tachypnea, and pulmonary source; severe w/ elevated lactate  COVID and flu ruled out  Continue tx with Zithromax and zosyn now. Stopped unasyn  Received 30 cc/kilogram IV fluid bolus  Blood cultures and sputum cultures ngtd. pending urine legionella, urine strep, and mrsa nares + started bactroban and elevated procalcitonin  CTA chest: IMPRESSION: 1.NO PULMONARY EMBOLUS. 2.PATCHY OPACITIES IN THE LEFT UPPER LOBE LIKELY DUE TO PNEUMONIA. 3.1.8 CM OVAL-SHAPED NODULAR DENSITY LEFT UPPER LOBE COULD BE DUE TO PNEUMONIA BUT WARRANTS SHORT-TERM FOLLOW-UP CT SCANNING IN 3 MONTHS TO CONFIRM RESOLUTION AND EXCLUDE A PULMONARY NODULE. 4.9 MM RIGHT UPPER LOBE LUNG NODULE IS NEW COMPARED TO 02/15/2023. RECOMMEND SHORT-TERM FOLLOW-UP CT SCANNING IN 3 MONTHS. 5.SUBTLE PERIBRONCHIAL OPACITIES IN THE RIGHT UPPER LOBE COULD BE INFECTIOUS OR INFLAMMATORY IN ETIOLOGY. 6.RIGHT MIDDLE LOBE AND RIGHT LOWER LOBE ATELECTASIS  ID consulted added po zyvox  -speech following. MBS passed but with aspiration precautions. No signs currently  CT ab/pelvis: IMPRESSION: Enlarged fatty  infiltrated liver. Atelectasis in the lung bases. 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 Recommend outpatient MRI of the pancreas are better characterization.  GI consulted       Acute on chronic COPD exacerbation secondary to bacterial pneumonia  Duoneb treatments q.4 hours   Solu-Medrol 40 mg IV push daily x5 days   Azithromycin 500 IV daily x5 days  Supplemental O2 PRN   Tessalon Perles 100 mg q.8 hours and Mucinex      Acute on chronic hypoxic respiratory failure from above  CTA negative for PE  Continue supplemental O2 therapy and attempt to wean as tolerated. On 2L NC prn at home on 6 here  Duonoebs, steroids     Hypomagnesemia -resolved   Received 2 gm IV mag on admission     Macrocytic anemia  Likely r/t alcohol   B12 and folate wnl     Essential HTN uncontrolled  Not on meds started norvasc  IV hydralazine PRN     Lung nodule  Noted on CT  Outpatient surveillance recommended per PCP or pulmonary     Transaminitis   Chronic, stable, secondary to alcohol use and fatty liver     Alcohol use disorder with withdrawal concerns  Last alcoholic drink 12/5  Alcohol cessation advised  CIWA protocol in place  Prn and scheduled ativan      Opioid use disorder  Gets Suboxone off the street, not currently prescribed it     Tobacco use disorder  Smoking cessation discussed   Nicotine patch provided     Carpal tunnel syndrome  Post surgery with Dr. 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 Generalized weakness/physical debility  Pt/ot/ss following. CM  consulted     Remaining medical comorbidities are stable and home meds have been resumed accordingly.     Code status: FULL CODE: ATTEMPT RESUSCITATION/CPR  DVT prophylaxis: Heparin SQ        Dispo: monitor labs and vitals. Gen sug and ID consulted. Monitor breathing. Wean O2 to baseline 2L prn. Follow cultures. Monitor for signs of withdrawal.       Florestine Avers, MD

## 2023-10-11 NOTE — Care Management Notes (Signed)
[facility]  Care Management Initial Evaluation    Patient Name: [name] [name]  Date of Birth: 1966-08-18  Sex: male  Date/Time of Admission: 10/10/2023 12:49 AM  Room/Bed: 570/A  Payor: HEALTH PLAN MEDICAID / Plan: HEALTH PLAN MEDICAID / Product Type: Medicaid [place] /   Primary Care Providers:  Levon Hedger, DO (General)    Pharmacy Info:   Preferred Pharmacy       [facility] [id number] - [place], [place] - [address]    [address] [place] [address]    Phone: [phone] Fax: [phone]    Hours: Not open 24 hours          Emergency Contact Info:   Extended Emergency Contact Information  Primary Emergency Contact: Zieske,AMBER  Address: [address]  Home Phone: [phone]  Work Phone: [phone]  Mobile Phone: [phone]  Relation: Daughter  Preferred language: English  Interpreter needed? No    History:   ROMANDO MELAHN is a 57 y.o., male, admitted 10/10/23.    Height/Weight: 188 cm (6\' 2" ) / (!) 139 kg (307 lb)     LOS: 1 day   Admitting Diagnosis: Pneumonia [J18.9]    Assessment:      10/10/23 1153   Assessment Details   Assessment Type Admission   Date of Care Management Update 10/10/23   Readmission   Is this a readmission? No   Insurance Information/Type   Insurance type Medicaid   Employment/Financial   Patient has Prescription Coverage?  Yes        Name of Insurance Coverage for Medications Health Plan Medicaid   Financial/Environmental Concerns none   Living Environment   Select an age group to open "lives with" row.  Adult   Lives With alone   Living Arrangements house   Home Safety   Home Assessment: No Problems Identified   Home Accessibility no concerns   Care Management Plan   Discharge Planning Status initial meeting   Projected Discharge Date 10/12/23   Discharge plan discussed with: Patient   Discharge Needs Assessment   Equipment Currently Used at Home oxygen;nebulizer   Discharge Facility/Level of Care Needs Home (Patient/Family  Member/other)(code 1)   Transportation Available car   Referral Information   Admission Type inpatient   Arrived From home or self-care     Patient admitted on 10/10/23 for pneumonia. IV Unasyn, Zithromax, and Solumedrol ordered. Currently on 3L NC. IV Unasyn, Zithromax, and Solumedrol ordered. History of alcohol abuse, CIWA protocol initiated. PRN Ativan ordered. CM met with patient at bedside. Home address, insurance and PCP verified. Patient lives at home alone. Patient uses a cane, nebulizer and 2L O2 as needed which is supplied through [employer]. Patient is able to drive and has adequate transportation. Patient not currently under care of home health agency.     10/11/23 (1139) Patient discussed in discharge planning. MBS pending. Receiving Ativan for alcohol withdrawal. ID following, IV Zithromax, Solumedrol and Zosyn ordered. No plans for d/c at this time.     Discharge Plan:  Home (Patient/Family Member/other) (code 1)  Patient is an independent home plan    The patient will continue to be evaluated for developing discharge needs.     Case Manager: Rica Records, RN

## 2023-10-11 NOTE — Progress Notes (Addendum)
[facility]  Infectious Disease Consult  Follow Up Note    [name], [name], 57 y.o. male  Date of Service: 10/11/2023  Date of Birth:  1965-11-25    Hospital Day:  LOS: 1 day     Interval HPI: [name] [name] is a 57 y.o. male with chronic obstructive pulmonary disease, polysubstance and alcohol abuse.  He was admitted with increasing shortness of breath and productive cough.    Vitals:   Vitals:    10/11/23 0404 10/11/23 0405 10/11/23 1019 10/11/23 1020   BP:  (!) 147/89 (!) 152/86    Pulse:  (!) 120 (!) 107    Resp:       Temp: 36.8 C (98.3 F)  36.5 C (97.7 F)    SpO2:  95%  94%   Weight:       Height:       BMI:            General:  Persistent shortness of breath and cough.  ENT:  Poor oral hygiene.  Lungs:  Expiratory wheezing and rhonchi bilaterally.  Cardiovascular:  Distant heart sounds, regular.  Abdomen:  Soft and nontender.  Extremities:  +1 edema.  Skin:  Dry.  Neurologic:  Mildly confused.    Antibiotics: Date Started Date Completed   1.     2.     3.     4.       Antibiotics (From admission, onward)      Start     Stop Route Frequency    10/11/23 0600  piperacillin-tazobactam (ZOSYN) 4.5 g in iso-osmotic 100 mL premix IVPB  (piperacillin-tazobactam (ZOSYN) IVPB load & maintenance dose)         -- IV EVERY 8 HOURS    10/10/23 2300  piperacillin-tazobactam (ZOSYN) 4.5 g in iso-osmotic 100 mL premix IVPB  (piperacillin-tazobactam (ZOSYN) IVPB load & maintenance dose)         10/10/23 2347 IV NOW    10/10/23 2100  mupirocin (BACTROBAN) 2% topical ointment  (Mupirocin (BACTROBAN) Nasal Protocol)  Status:  Discontinued         10/10/23 1621 EACH NOSTRIL 2 TIMES DAILY    10/10/23 2100  mupirocin (BACTROBAN) 2% topical ointment         10/15/23 2059 APPLY TOPICA 2 TIMES DAILY    10/10/23 1800  linezolid (ZYVOX) tablet         -- Oral 2 TIMES DAILY    10/10/23 1100  ampicillin-sulbactam (UNASYN) 3 g in NS 100 mL IVPB - DOCKED  Status:  Discontinued         10/10/23 2222 IV EVERY 6 HOURS    10/10/23 0700   azithromycin (ZITHROMAX) 500 mg in NS 250 mL IVPB         10/15/23 0659 IV EVERY 24 HOURS    10/10/23 0500  ampicillin-sulbactam (UNASYN) 3 g in NS 100 mL IVPB - DOCKED         10/10/23 0524 IV NOW    10/10/23 0500  doxycycline hyclate 100 mg in NS 100 mL IVPB - DOCKED         10/10/23 0624 IV NOW             Labs:    Results for orders placed or performed during the hospital encounter of 10/10/23 (from the past 24 hour(s))   LEGIONELLA URINE ANTIGEN    Specimen: Urine, Site not specified   Result Value Ref Range    LEGIONELLA ANTIGEN Negative  Negative, Indeterminate   STREPTOCOCCUS PNEUMONIAE ANTIGEN,URINE    Specimen: Urine, Site not specified   Result Value Ref Range    S.PNEUMONIA ANTIGEN Negative Negative, Indeterminate   MRSA SCREEN, PCR, RAPID    Specimen: Nasal Swab   Result Value Ref Range    MRSA COLONIZATION SCREEN Positive (A) Negative   LACTIC ACID LEVEL W/ REFLEX FOR LEVEL >2.0   Result Value Ref Range    LACTIC ACID 3.4 (H) 0.7 - 2.0 mmol/L   EXTRA TUBES    Narrative    The following orders were created for panel order EXTRA TUBES.  Procedure                               Abnormality         Status                     ---------                               -----------         ------                     GOLD TOP ZOXW[[id number]]                                    Final result               LAVENDER TOP [phone]                                Final result                 Please view results for these tests on the individual orders.   GOLD TOP TUBE   Result Value Ref Range    RAINBOW/EXTRA TUBE AUTO RESULT Yes    LAVENDER TOP TUBE   Result Value Ref Range    RAINBOW/EXTRA TUBE AUTO RESULT Yes    LACTIC ACID - FIRST REFLEX   Result Value Ref Range    LACTIC ACID 3.9 (H) 0.7 - 2.0 mmol/L   LACTIC ACID - SECOND REFLEX   Result Value Ref Range    LACTIC ACID 4.9 (HH) 0.7 - 2.0 mmol/L   EXTRA TUBES    Narrative    The following orders were created for panel order EXTRA TUBES.  Procedure                                Abnormality         Status                     ---------                               -----------         ------                     GOLD TOP HYQM[[id number]]  Final result                 Please view results for these tests on the individual orders.   GOLD TOP TUBE   Result Value Ref Range    RAINBOW/EXTRA TUBE AUTO RESULT Yes    LACTIC ACID LEVEL W/ REFLEX FOR LEVEL >2.0   Result Value Ref Range    LACTIC ACID 3.3 (H) 0.7 - 2.0 mmol/L   LACTIC ACID - FIRST REFLEX   Result Value Ref Range    LACTIC ACID 3.5 (H) 0.7 - 2.0 mmol/L   CBC/DIFF    Narrative    The following orders were created for panel order CBC/DIFF.  Procedure                               Abnormality         Status                     ---------                               -----------         ------                     CBC WITH DIFF[[id number]]                Abnormal            Final result                 Please view results for these tests on the individual orders.   BASIC METABOLIC PANEL, NON-FASTING   Result Value Ref Range    SODIUM 138 137 - 145 mmol/L    POTASSIUM 4.4 3.5 - 5.1 mmol/L    CHLORIDE 99 98 - 107 mmol/L    CO2 TOTAL 29 22 - 30 mmol/L    ANION GAP 10 5 - 19 mmol/L    CALCIUM 9.6 8.4 - 10.2 mg/dL    GLUCOSE 629 (H) 74 - 106 mg/dL    BUN 15 9 - 20 mg/dL    CREATININE 5.28 4.13 - 1.20 mg/dL    BUN/CREA RATIO 18 6 - 20    ESTIMATED GFR >60 >60 mL/min/1.18m^2    Narrative    Estimated Glomerular Filtration Rate (eGFR) is calculated using the CKD-EPI (2021) equation, intended for patients 64 years of age and older. If gender is not documented or "unknown", there will be no eGFR calculation.   MAGNESIUM - AM ONCE   Result Value Ref Range    MAGNESIUM 1.8 1.6 - 2.3 mg/dL   CBC WITH DIFF   Result Value Ref Range    WBC 10.5 4.5 - 11.5 x10^3/uL    RBC 3.67 (L) 4.30 - 5.90 x10^6/uL    HGB 12.7 (L) 13.9 - 16.3 g/dL    HCT 24.4 01.0 - 27.2 %    MCV 105.6 (H) 80.0 - 100.0 fL    MCH 34.4 (H) 25.4 -  34.0 pg    MCHC 32.6 30.0 - 37.0 g/dL    RDW 53.6 (H) 64.4 - 14.0 %    PLATELETS 269 130 - 400 x10^3/uL    MPV 7.8 7.5 - 11.5 fL    NEUTROPHIL % 77 (H) 41 - 69 %    LYMPHOCYTE % 12 (L) 19 -  46 %    MONOCYTE % 11 4 - 12 %    EOSINOPHIL % 0 0 - 5 %    BASOPHIL % 0 0 - 2 %    NEUTROPHIL # 8.10 (H) 1.80 - 7.50 x10^3/uL    LYMPHOCYTE # 1.30 1.10 - 3.80 x10^3/uL    MONOCYTE # 1.10 (H) 0.10 - 0.80 x10^3/uL    EOSINOPHIL # 0.00 0.00 - 0.60 x10^3/uL    BASOPHIL # 0.00 0.00 - 0.20 x10^3/uL   SCAN DIFFERENTIAL   Result Value Ref Range    PLATELET ESTIMATE Adequate     ANISOCYTOSIS Present (A) Absent    MACROCYTOSIS Present (A) Absent    POLYCHROMASIA Present (A) Absent    STOMATOCYTES Present (A) Absent    WBC MORPHOLOGY COMMENT Normal    LACTIC ACID - SECOND REFLEX   Result Value Ref Range    LACTIC ACID 2.1 (H) 0.7 - 2.0 mmol/L        Microbiology:     BLOOD CULTURE, ROUTINE   Date Value Ref Range Status   10/10/2023 No Growth 18-24 hrs.  Preliminary   10/10/2023 No Growth 18-24 hrs.  Preliminary     No results found for this or any previous visit (from the past [id number] hour(s)).  No results found for: "URINECX"      Imaging Studies:   Results for orders placed or performed during the hospital encounter of 10/10/23   XR CHEST PA AND LATERAL     Status: None    Narrative    Chrissie Noa T Ostrom    RADIOLOGIST: Elna Breslow, MD    XR CHEST PA AND LATERAL performed on 10/10/2023 1:55 AM    CLINICAL HISTORY: SOB.  c/o cough, dyspnea prev: pneumonia    TECHNIQUE: Frontal and lateral views of the chest.    COMPARISON:  05/06/2023    FINDINGS:    The heart size is normal.  The mediastinal contour is unremarkable.  Atelectasis right lung base. There is some airspace opacity left midlung which could be atelectasis or pneumonia.   The bones are unremarkable.        Impression    NO ACUTE FINDINGS.      Radiologist location ID: [id number]     CT ANGIO CHEST FOR PULMONARY EMBOLUS W IV CONTRAST     Status: None    Narrative    Chrissie Noa  T Gipe    RADIOLOGIST: Alvester Chou, MD    CT ANGIO CHEST FOR PULMONARY EMBOLUS W IV CONTRAST performed on 10/10/2023 3:15 AM    CLINICAL HISTORY: tachy SOB recent surgery.  pt sts dyspnea and cough x 2 wks, but progressed today. pmh COPD.    TECHNIQUE: CTA imaging of the chest with intravenous contrast.  3D reconstructions.  CONTRAST:  200 ml's of Isovue 370    COMPARISON: 02/15/2023         FINDINGS:  Hardware:  None.    Lymph nodes:   No mediastinal, hilar, or axillary lymphadenopathy.    Heart:  Coronary artery calcifications are noted.        RV/LV Diameter Ratio: N/A    Thoracic Aorta:  No thoracic aortic aneurysm or dissection.    Pulmonary Vessels:  No evidence of acute pulmonary emboli through the major subsegmental branches.     Most Proximal Level Of Embolus (if embolus present): N/A    Lungs and Airways:  There are some patchy opacities in the left upper lobe likely representing pneumonia. Associated  with these opacities is an oval-shaped nodular density measuring 1.8 cm in diameter on axial image #99. This could be due to pneumonia but this will warrant short-term follow-up to confirm resolution and exclude a pulmonary nodule. In the right upper lobe on axial image #66, there is a 9 mm pulmonary nodule. This is new compared to 02/15/2023. This also warrant short-term follow-up CT scanning. There are some subtle peribronchial opacities in the right upper lobe which could be infectious or inflammatory in etiology.    Right lower lobe atelectasis and right middle lobe atelectasis is present.    Pleura: No pleural effusion.  No pneumothorax.    Upper Abdomen: Hepatomegaly with fatty liver    Bones: Bone windows are unremarkable.        Impression    1. NO PULMONARY EMBOLUS  2. PATCHY OPACITIES IN THE LEFT UPPER LOBE LIKELY DUE TO PNEUMONIA  3. 1.8 CM OVAL-SHAPED NODULAR DENSITY LEFT UPPER LOBE COULD BE DUE TO PNEUMONIA BUT WARRANTS SHORT-TERM FOLLOW-UP CT SCANNING IN 3 MONTHS TO CONFIRM RESOLUTION AND  EXCLUDE A PULMONARY NODULE  4. 9 MM RIGHT UPPER LOBE LUNG NODULE IS NEW COMPARED TO 02/15/2023. RECOMMEND SHORT-TERM FOLLOW-UP CT SCANNING IN 3 MONTHS  5. SUBTLE PERIBRONCHIAL OPACITIES IN THE RIGHT UPPER LOBE COULD BE INFECTIOUS OR INFLAMMATORY IN ETIOLOGY  6. RIGHT MIDDLE LOBE AND RIGHT LOWER LOBE ATELECTASIS      One or more dose reduction techniques were used (e.g., Automated exposure control, adjustment of the mA and/or kV according to patient size, use of iterative reconstruction technique).      Radiologist location ID: [id number]     XR AP MOBILE CHEST     Status: None    Narrative    Chrissie Noa T Marengo    RADIOLOGIST: Mickey Farber, MD    XR AP MOBILE CHEST performed on 10/10/2023 1:50 PM    CLINICAL HISTORY: f/u pna.  reeval pneumonia status    TECHNIQUE: Frontal view of the chest.    COMPARISON:  Prior exam from today    FINDINGS:    Heart size is mildly enlarged.     As before there are left perihilar and right basilar infiltrates with elevation of the right diaphragm   There are several old right rib fracture deformities        Impression    Persistent bilateral infiltrates/atelectasis similar to the prior exam        Radiologist location ID: [id number]     CT ABDOMEN PELVIS WO IV CONTRAST     Status: None    Narrative    Chrissie Noa T Andreasen    RADIOLOGIST: Elna Breslow, MD    CT ABDOMEN PELVIS WO IV CONTRAST performed on 10/11/2023 12:51 AM    CLINICAL HISTORY: r/o infection.  infection, abdominal pain. hx alcoholism    TECHNIQUE:  Abdomen and pelvis CT without intravenous contrast.    COMPARISON:  02/15/2023    FINDINGS:  Noncontrast technique limits evaluation of the abdominal and pelvic viscera.    Lung bases: Atelectasis in the lung bases.    Liver:   Fatty infiltration of the liver. Liver is diffusely enlarged.    Gallbladder:   Unremarkable.    Spleen:   Unremarkable.    Pancreas:   1.2 cm hypodense lesion in the region of the head of pancreas which is unchanged prior exam. Recommend outpatient MRI of  the pancreas for better characterization.    Adrenals:   Unremarkable.    Kidneys:  No urinary calculus nor obstruction.    Bladder:  Unremarkable.    Prostate:  Unremarkable.    Bowel:   Colonic diverticulosis without diverticulitis. Moderate amount stool throughout the colon suggesting constipation.    Appendix:  Normal.    Lymph nodes:  No suspicious lymph node enlargement.    Vasculature:   Mild diffuse atherosclerotic calcifications are noted.     Peritoneum / Retroperitoneum: Small fat-containing umbilical hernia.    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        Impression    Enlarged fatty infiltrated liver. Atelectasis in the lung bases.    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 Recommend outpatient MRI of the pancreas are better characterization.       One or more dose reduction techniques were used (e.g., Automated exposure control, adjustment of the mA and/or kV according to patient size, use of iterative reconstruction technique).      Radiologist location ID: [id number]     FLUORO ESOPHAGRAM,MODIFIED SWALLOW (SPEECH PERFORMED)     Status: None    Narrative    *Exam not read by Radiology.  *Refer to Physician's procedure note for result.        Assessment:    Acute bilateral lung pneumonia.  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  His sputum grew Gram-negative organism.  His MRSA nasal  culture came back as positive.    Chronic obstructive pulmonary disease.    Polysubstance abuse.      Alcohol abuse.           Plan:    Day 1: Zosyn, Zyvox, azithromycin.    He had his modified barium swallow today.  We are going to follow up the result.  We are going to continue all supportive care.    I have seen and examined the patient today.  I reviewed all pertinent lab work, microbiology cultures and radiologic imaging studies.  I discussed the current management with the patient      Clinical time spent is 45 minutes including coordination of pertinent care       I will be out from 12/7 to  12/10.  Drs. Arcega/Kassar covering for me.        Pollyanna Levay Lilla Shook, MD

## 2023-10-11 NOTE — Care Plan (Signed)
Problem: Adult Inpatient Plan of Care  Goal: Plan of Care Review  Outcome: Ongoing (see interventions/notes)  Goal: Patient-Specific Goal (Individualized)  Outcome: Ongoing (see interventions/notes)  Goal: Absence of Hospital-Acquired Illness or Injury  Outcome: Ongoing (see interventions/notes)  Intervention: Prevent Skin Injury  Recent Flowsheet Documentation  Taken 10/11/2023 1200 by Flora Lipps, RN  Body Position: sitting  Goal: Optimal Comfort and Wellbeing  Outcome: Ongoing (see interventions/notes)  Goal: Rounds/Family Conference  Outcome: Ongoing (see interventions/notes)     Problem: Fall Injury Risk  Goal: Absence of Fall and Fall-Related Injury  Outcome: Ongoing (see interventions/notes)     Problem: Alcohol Withdrawal  Goal: Alcohol Withdrawal Symptom Control  Outcome: Ongoing (see interventions/notes)  Goal: Optimal Neurologic Function  Outcome: Ongoing (see interventions/notes)  Goal: Readiness for Change Identified  Outcome: Ongoing (see interventions/notes)     Problem: Infection  Goal: Absence of Infection Signs and Symptoms  Outcome: Ongoing (see interventions/notes)     Problem: Pneumonia  Goal: Fluid Balance  Outcome: Ongoing (see interventions/notes)  Goal: Absence of Infection Signs and Symptoms  Outcome: Ongoing (see interventions/notes)  Goal: Effective Oxygenation and Ventilation  Outcome: Ongoing (see interventions/notes)     Problem: Skin Injury Risk Increased  Goal: Skin Health and Integrity  Outcome: Ongoing (see interventions/notes)     Care plan reviewed and continued.  [name] Naegeli, RN

## 2023-10-12 ENCOUNTER — Inpatient Hospital Stay (HOSPITAL_COMMUNITY): Payer: MEDICAID

## 2023-10-12 DIAGNOSIS — R609 Edema, unspecified: Secondary | ICD-10-CM

## 2023-10-12 LAB — ASPERGILLUS (GALACTOMANNAN) ANTIGEN, SERUM
ASPERGILLUS AG, EIA, SER: NOT DETECTED
INDEX VALUE: 0.04 (ref <0.50)

## 2023-10-12 LAB — BASIC METABOLIC PANEL
ANION GAP: 8 mmol/L (ref 5–19)
BUN/CREA RATIO: 15 (ref 6–20)
BUN: 16 mg/dL (ref 9–20)
CALCIUM: 9.2 mg/dL (ref 8.4–10.2)
CHLORIDE: 96 mmol/L — ABNORMAL LOW (ref 98–107)
CO2 TOTAL: 33 mmol/L — ABNORMAL HIGH (ref 22–30)
CREATININE: 1.05 mg/dL (ref 0.66–1.20)
ESTIMATED GFR: >60 mL/min/{1.73_m2} (ref >60)
GLUCOSE: 122 mg/dL — ABNORMAL HIGH (ref 74–106)
POTASSIUM: 3.7 mmol/L (ref 3.5–5.1)
SODIUM: 137 mmol/L (ref 137–145)

## 2023-10-12 LAB — CBC
HCT: 38.7 % (ref 36.0–46.0)
HGB: 13.2 g/dL — ABNORMAL LOW (ref 13.9–16.3)
MCH: 35.9 pg — ABNORMAL HIGH (ref 25.4–34.0)
MCHC: 34 g/dL (ref 30.0–37.0)
MCV: 105.5 fL — ABNORMAL HIGH (ref 80.0–100.0)
MPV: 7.5 fL (ref 7.5–11.5)
PLATELETS: 268 10*3/uL (ref 130–400)
RBC: 3.67 10*6/uL — ABNORMAL LOW (ref 4.30–5.90)
RDW: 15.7 % — ABNORMAL HIGH (ref 11.5–14.0)
WBC: 10.5 10*3/uL (ref 4.5–11.5)

## 2023-10-12 LAB — GOLD TOP TUBE

## 2023-10-12 LAB — AMMONIA: AMMONIA: 13 umol/L (ref 9–30)

## 2023-10-12 LAB — LAVENDER TOP TUBE

## 2023-10-12 MED ORDER — IPRATROPIUM 0.5 MG-ALBUTEROL 3 MG (2.5 MG BASE)/3 ML NEBULIZATION SOLN
3.0000 mL | INHALATION_SOLUTION | RESPIRATORY_TRACT | Status: DC | PRN
Start: 2023-10-12 — End: 2023-10-14
  Administered 2023-10-13: 3 mL via RESPIRATORY_TRACT
  Filled 2023-10-12: qty 3

## 2023-10-12 MED ORDER — MELATONIN 3 MG TABLET
6.0000 mg | ORAL_TABLET | Freq: Every evening | ORAL | Status: DC
Start: 2023-10-12 — End: 2023-10-14
  Administered 2023-10-12 – 2023-10-13 (×2): 6 mg via ORAL
  Filled 2023-10-12 (×2): qty 2

## 2023-10-12 MED ORDER — DIPHENHYDRAMINE 50 MG/ML INJECTION SOLUTION
25.0000 mg | Freq: Once | INTRAMUSCULAR | Status: AC
Start: 2023-10-12 — End: 2023-10-12
  Administered 2023-10-12: 25 mg via INTRAVENOUS
  Filled 2023-10-12: qty 1

## 2023-10-12 MED ORDER — PHENOBARBITAL 32.4 MG TABLET
32.4000 mg | ORAL_TABLET | Freq: Three times a day (TID) | ORAL | Status: DC
Start: 2023-10-16 — End: 2023-10-14

## 2023-10-12 MED ORDER — FUROSEMIDE 10 MG/ML INJECTION SOLUTION
20.0000 mg | Freq: Every day | INTRAMUSCULAR | Status: DC
Start: 2023-10-12 — End: 2023-10-14
  Administered 2023-10-12 – 2023-10-14 (×3): 20 mg via INTRAVENOUS
  Filled 2023-10-12 (×3): qty 2

## 2023-10-12 MED ORDER — LORAZEPAM 2 MG/ML INJECTION WRAPPER
2.0000 mg | Freq: Once | INTRAMUSCULAR | Status: AC
Start: 2023-10-12 — End: 2023-10-12
  Administered 2023-10-12: 2 mg via INTRAVENOUS
  Filled 2023-10-12: qty 1

## 2023-10-12 MED ORDER — PHENOBARBITAL 32.4 MG TABLET
64.8000 mg | ORAL_TABLET | Freq: Three times a day (TID) | ORAL | Status: DC
Start: 2023-10-14 — End: 2023-10-14

## 2023-10-12 MED ORDER — PHENOBARBITAL 60 MG TABLET
30.0000 mg | ORAL_TABLET | Freq: Three times a day (TID) | ORAL | Status: DC
Start: 2023-10-16 — End: 2023-10-12

## 2023-10-12 MED ORDER — DIAZEPAM 5 MG/ML INJECTION SYRINGE
5.0000 mg | INJECTION | Freq: Once | INTRAMUSCULAR | Status: AC
Start: 2023-10-12 — End: 2023-10-12
  Administered 2023-10-12: 5 mg via INTRAVENOUS
  Filled 2023-10-12: qty 2

## 2023-10-12 MED ORDER — PHENOBARBITAL 60 MG TABLET
60.0000 mg | ORAL_TABLET | Freq: Three times a day (TID) | ORAL | Status: DC
Start: 2023-10-14 — End: 2023-10-12

## 2023-10-12 MED ORDER — PHENOBARBITAL 32.4 MG TABLET
97.2000 mg | ORAL_TABLET | Freq: Three times a day (TID) | ORAL | Status: AC
Start: 2023-10-12 — End: 2023-10-14
  Administered 2023-10-12 – 2023-10-14 (×6): 97.2 mg via ORAL
  Filled 2023-10-12 (×6): qty 3

## 2023-10-12 MED ORDER — PHENOBARBITAL 60 MG TABLET
90.0000 mg | ORAL_TABLET | Freq: Three times a day (TID) | ORAL | Status: DC
Start: 2023-10-12 — End: 2023-10-12

## 2023-10-12 MED ORDER — BUDESONIDE 0.5 MG/2 ML SUSPENSION FOR NEBULIZATION
0.5000 mg | INHALATION_SUSPENSION | Freq: Two times a day (BID) | RESPIRATORY_TRACT | Status: DC | PRN
Start: 2023-10-12 — End: 2023-10-14
  Administered 2023-10-13: 0.5 mg via RESPIRATORY_TRACT
  Filled 2023-10-12: qty 2

## 2023-10-12 MED ORDER — AMLODIPINE 10 MG TABLET
10.0000 mg | ORAL_TABLET | Freq: Every day | ORAL | Status: DC
Start: 2023-10-13 — End: 2023-10-14
  Administered 2023-10-13 – 2023-10-14 (×2): 10 mg via ORAL
  Filled 2023-10-12 (×2): qty 1

## 2023-10-12 MED ORDER — FUROSEMIDE 10 MG/ML INJECTION SOLUTION
40.0000 mg | Freq: Once | INTRAMUSCULAR | Status: AC
Start: 2023-10-12 — End: 2023-10-12
  Administered 2023-10-12: 40 mg via INTRAVENOUS
  Filled 2023-10-12 (×2): qty 4

## 2023-10-12 NOTE — Care Plan (Signed)
[facility]  Care Plan Note    Situation: Pt continues on Ativan PO for alcohol withdrawal.     Intervention: NS @ 100 cc/hr. Continues on IV Zosyn and Zithromax.     Response: Pt wearing O2 PRN 2L.       Amarrah Meinhart, RN

## 2023-10-12 NOTE — Progress Notes (Addendum)
[facility]       HOSPITALIST PROGRESS NOTE      Date of Service:  10/12/2023  [name] [name], [name] [name], 57 y.o. male  Date of Admission:  10/10/2023  Date of Birth:  09-17-1966  PCP: Cecilie Kicks, DO    Chief Complaint: pneumonia, sepsis     Subjective:  patient is stable. Back to baseline 2L NC prn. IVF stopped. On zosyn, zyvox and azithro. Denies any symptoms at this time. On and off withdrawal symptoms. ID following    Review of systems other than the above were negative     Current Facility-Administered Medications   Medication Dose Route Frequency    acetaminophen (TYLENOL) tablet  650 mg Oral Q6H PRN    allopurinol (ZYLOPRIM) tablet  100 mg Oral 2x/day    amLODIPine (NORVASC) tablet  5 mg Oral Daily    azithromycin (ZITHROMAX) 500 mg in NS 250 mL IVPB  500 mg Intravenous Q24H    benzonatate (TESSALON) capsule  100 mg Oral Q8H    budesonide (PULMICORT RESPULES) 0.5 mg/2 mL nebulizer suspension  0.5 mg Nebulization 2x/day    buPROPion (WELLBUTRIN XL) 24 hr extended release tablet  150 mg Oral Daily    diphenhydrAMINE (BENADRYL) capsule  25 mg Oral HS PRN    docusate sodium (COLACE) capsule  100 mg Oral 2x/day PRN    guaiFENesin (MUCINEX) extended release tablet - for cough (expectorant)  600 mg Oral 2x/day    heparin 5,000 unit/mL injection  5,000 Units Subcutaneous Q8HRS    hydrALAZINE (APRESOLINE) injection 5 mg  5 mg Intravenous Q6H PRN    ibuprofen (MOTRIN) tablet  800 mg Oral 4x/day PRN    ipratropium-albuterol 0.5 mg-3 mg(2.5 mg base)/3 mL Solution for Nebulization  3 mL Nebulization Q4H    linezolid (ZYVOX) tablet  600 mg Oral 2x/day    LORazepam (ATIVAN) tablet  1 mg Oral Q2H PRN    Or    LORazepam (ATIVAN) tablet  2 mg Oral Q2H PRN    LORazepam (ATIVAN) tablet  1 mg Oral Q8H    [START ON 10/14/2023] LORazepam (ATIVAN) tablet  0.5 mg Oral Q8H    magnesium oxide (MAG-OX) 400mg  (241.3 mg elemental magnesium) tablet  400 mg Oral Daily    methylPREDNISolone sod succ (SOLU-medrol) 40 mg/mL injection  40 mg  Intravenous Daily    mupirocin (BACTROBAN) 2% topical ointment   Apply Topically 2x/day    nicotine (NICODERM CQ) transdermal patch (mg/24 hr)  21 mg Transdermal Daily    NS flush syringe  10 mL Intracatheter Q8HRS    NS flush syringe  10 mL Intracatheter Q1H PRN    NS premix infusion   Intravenous Continuous    ondansetron (ZOFRAN) 2 mg/mL injection  4 mg Intravenous Q6H PRN    piperacillin-tazobactam (ZOSYN) 4.5 g in iso-osmotic 100 mL premix IVPB  4.5 g Intravenous Q8H    thiamine-vitamin B1 tablet  100 mg Oral Daily        Allergies   Allergen Reactions    Naproxen Nausea/ Vomiting             Filed Vitals:    10/12/23 0326 10/12/23 0327 10/12/23 0922 10/12/23 0923   BP: (!) 167/104   (!) 168/90   Pulse: (!) 111   (!) 113   Resp:       Temp: 36.6 C (97.8 F)  36.6 C (97.8 F)    SpO2:  91%  (!) 87%     O2 delivery:  None (Room Air)      Physical Exam:  General:  no acute distress, on 2L prn baseline. pleasant   Skin: no lesions or rashes   HEENT: PERRLA, EOMI, NC/AT, no scleral icterus. Normal neck, no adenopathy, no thyromegaly, no mass, normal lips, no nasal discharge.   Respiratory:  normal breathing, wheezing and rhonchis b/l no respiratory distress improving. Diminished   Cardiovascular:  RRR, no murmur, no edema, pulses intact  Gastrointestinal: normal abdomen, normal active bowel sounds. No mass or tenderness.   Musculoskeletal: no cyanosis or edema.   Neurological: AAO x 3, normal strength and motor function  Genitourinary:  No incontinence or Foley catheter   Psych: no depression or anxiety        Laboratory Data:     Results for orders placed or performed during the hospital encounter of 10/10/23 (from the past 24 hour(s))   LACTIC ACID LEVEL W/ REFLEX FOR LEVEL >2.0   Result Value Ref Range    LACTIC ACID 1.8 0.7 - 2.0 mmol/L   CBC - AM ONCE   Result Value Ref Range    WBC 10.5 4.5 - 11.5 x10^3/uL    RBC 3.67 (L) 4.30 - 5.90 x10^6/uL    HGB 13.2 (L) 13.9 - 16.3 g/dL    HCT 16.1 09.6 - 04.5 %    MCV  105.5 (H) 80.0 - 100.0 fL    MCH 35.9 (H) 25.4 - 34.0 pg    MCHC 34.0 30.0 - 37.0 g/dL    RDW 40.9 (H) 81.1 - 14.0 %    PLATELETS 268 130 - 400 x10^3/uL    MPV 7.5 7.5 - 11.5 fL   BASIC METABOLIC PANEL - AM ONCE   Result Value Ref Range    SODIUM 137 137 - 145 mmol/L    POTASSIUM 3.7 3.5 - 5.1 mmol/L    CHLORIDE 96 (L) 98 - 107 mmol/L    CO2 TOTAL 33 (H) 22 - 30 mmol/L    ANION GAP 8 5 - 19 mmol/L    CALCIUM 9.2 8.4 - 10.2 mg/dL    GLUCOSE 914 (H) 74 - 106 mg/dL    BUN 16 9 - 20 mg/dL    CREATININE 7.82 9.56 - 1.20 mg/dL    BUN/CREA RATIO 15 6 - 20    ESTIMATED GFR >60 >60 mL/min/1.62m^2       Imaging Studies:    FLUORO ESOPHAGRAM,MODIFIED SWALLOW (SPEECH PERFORMED)   Final Result by Herminio Commons, RT (R) (12/06 2130)      CT ABDOMEN PELVIS WO IV CONTRAST   Final Result by Edi, Radresults In (12/06 0107)   Enlarged fatty infiltrated liver. Atelectasis in the lung bases.      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 Recommend outpatient MRI of the pancreas are better characterization.          One or more dose reduction techniques were used (e.g., Automated exposure control, adjustment of the mA and/or kV according to patient size, use of iterative reconstruction technique).         Radiologist location ID: [id number]         XR AP MOBILE CHEST   Final Result by Edi, Radresults In (12/05 1354)   Persistent bilateral infiltrates/atelectasis similar to the prior exam            Radiologist location ID: [id number]         CT ANGIO CHEST FOR PULMONARY EMBOLUS W IV CONTRAST  Final Result by Edi, Radresults In (12/05 9604)   1. NO PULMONARY EMBOLUS   2. PATCHY OPACITIES IN THE LEFT UPPER LOBE LIKELY DUE TO PNEUMONIA   3. 1.8 CM OVAL-SHAPED NODULAR DENSITY LEFT UPPER LOBE COULD BE DUE TO PNEUMONIA BUT WARRANTS SHORT-TERM FOLLOW-UP CT SCANNING IN 3 MONTHS TO CONFIRM RESOLUTION AND EXCLUDE A PULMONARY NODULE   4. 9 MM RIGHT UPPER LOBE LUNG NODULE IS NEW COMPARED TO 02/15/2023. RECOMMEND SHORT-TERM  FOLLOW-UP CT SCANNING IN 3 MONTHS   5. SUBTLE PERIBRONCHIAL OPACITIES IN THE RIGHT UPPER LOBE COULD BE INFECTIOUS OR INFLAMMATORY IN ETIOLOGY   6. RIGHT MIDDLE LOBE AND RIGHT LOWER LOBE ATELECTASIS         One or more dose reduction techniques were used (e.g., Automated exposure control, adjustment of the mA and/or kV according to patient size, use of iterative reconstruction technique).         Radiologist location ID: [id number]         XR CHEST PA AND LATERAL   Final Result by Edi, Radresults In (12/05 0201)   NO ACUTE FINDINGS.         Radiologist location ID: [id number]             Assessment/Plan:        Active Hospital Problems   (*Primary Problem)     Diagnosis    *Pneumonia    Sepsis (CMS HCC)    Acute hypoxic respiratory failure (CMS HCC)    Vapes nicotine containing substance    History of substance use    Chronic obstructive pulmonary disease (CMS HCC)      Severe sepsis 2/2 acute bacterial PNA from suspected gram +/-/atypical organisms  Lactic acidosis -improved  Meets sepsis criteria given tachycardia, tachypnea, and pulmonary source; severe w/ elevated lactate  COVID and flu ruled out  Continue tx with Zithromax and zosyn now. Stopped unasyn  Received 30 cc/kilogram IV fluid bolus, IVF stopped  Blood cultures and sputum cultures ngtd. pending urine legionella, urine strep, and mrsa nares + started bactroban and elevated procalcitonin  CTA chest: IMPRESSION: 1.NO PULMONARY EMBOLUS. 2.PATCHY OPACITIES IN THE LEFT UPPER LOBE LIKELY DUE TO PNEUMONIA. 3.1.8 CM OVAL-SHAPED NODULAR DENSITY LEFT UPPER LOBE COULD BE DUE TO PNEUMONIA BUT WARRANTS SHORT-TERM FOLLOW-UP CT SCANNING IN 3 MONTHS TO CONFIRM RESOLUTION AND EXCLUDE A PULMONARY NODULE. 4.9 MM RIGHT UPPER LOBE LUNG NODULE IS NEW COMPARED TO 02/15/2023. RECOMMEND SHORT-TERM FOLLOW-UP CT SCANNING IN 3 MONTHS. 5.SUBTLE PERIBRONCHIAL OPACITIES IN THE RIGHT UPPER LOBE COULD BE INFECTIOUS OR INFLAMMATORY IN ETIOLOGY. 6.RIGHT MIDDLE LOBE AND RIGHT LOWER LOBE  ATELECTASIS  ID consulted added po zyvox  -speech following. MBS passed but with aspiration precautions. No signs currently  CT ab/pelvis: IMPRESSION: Enlarged fatty infiltrated liver. Atelectasis in the lung bases. 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 Recommend outpatient MRI of the pancreas are better characterization.  GI consulted and NTD f/u outpt       Acute on chronic COPD exacerbation secondary to bacterial pneumonia  Duoneb treatments q.4 hours   Solu-Medrol 40 mg IV push daily x5 days   Azithromycin 500 IV daily x5 days  Supplemental O2 PRN   Tessalon Perles 100 mg q.8 hours and Mucinex      Acute on chronic hypoxic respiratory failure from above  CTA negative for PE  Continue supplemental O2 therapy and attempt to wean as tolerated. On 2L NC prn at home on 6 here now back to baseline  Duonoebs, steroids     Hypomagnesemia -resolved   Received 2 gm IV mag on admission     Macrocytic anemia  Likely r/t alcohol   B12 and folate wnl     Essential HTN uncontrolled  Not on meds started Norvasc increased to 10 mg daily  IV hydralazine PRN     Lung nodule  Noted on CT  Outpatient surveillance recommended per PCP or pulmonary     Transaminitis   Chronic, stable, secondary to alcohol use and fatty liver     Alcohol use disorder with withdrawal concerns  Last alcoholic drink 12/5  Alcohol cessation advised  CIWA protocol in place  Prn and scheduled ativan to now phenobarbital     Opioid use disorder  Gets Suboxone off the street, not currently prescribed it     Tobacco use disorder  Smoking cessation discussed   Nicotine patch provided     Carpal tunnel syndrome  Post surgery with Dr. 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 Generalized weakness/physical debility  Pt/ot/ss following. CM consulted    20. B/l leg/feet edema  Suspected due to IVF now stopped  Echo 3/24 preserved EF  Lasix 20 mg daily started and 40 mg IV once  Venous duplex pending.  No need for cardio     Remaining medical comorbidities are stable and home meds have been resumed accordingly.     Code status: FULL CODE: ATTEMPT RESUSCITATION/CPR  DVT prophylaxis: Heparin SQ        Dispo: monitor labs and vitals. Gen sug and ID consulted. Monitor breathing. Weaned O2 to baseline 2L prn. Follow cultures. Monitor for signs of withdrawal.       Florestine Avers, MD    Interval update: abdominal US for ascites and UA/UDS pending. Ammonia WNL. Family mentions he gets Suboxone from friends but not physician.

## 2023-10-12 NOTE — Care Plan (Signed)
[facility]  Care Plan Note    Situation: Ativan d/c'd. Phenobarb taper ordered. Pt reports feeling better rested and is more cooperative today.     Intervention: Continues on IV Zosyn and Zithromax. IVF d/c'd.     Response: Ammonia level normal. Lasix IV given.       Nadean Montanaro, RN

## 2023-10-12 NOTE — PT Evaluation (Signed)
[facility]   Acute Care Physical Therapy  Medical [address]  [place], [place] [address]  [phone]         Physical Therapy Acute Care  Evaluation    Date: 10/12/2023  Time: 800  Patient's Name: [name] [name]  Date of Birth: Mar 26, 1966  Room Number: 570/A    Physical Therapy Order & Physician:   PT Eval and Treat/Alam     Medical Diagnosis :    Pneumonia      Relevant Past Medical History :      Past Medical History:   Diagnosis Date    Cervical stenosis of spinal canal     Chronic gum disease     RISK OF TEETH FALLING OUT    Chronic obstructive pulmonary disease (CMS HCC) 04/29/2023    Chronic pain     COPD (chronic obstructive pulmonary disease) (CMS HCC)     CVD (cerebrovascular disease)     Exertional dyspnea 04/29/2023    Gait disturbance     Gout     History of anesthesia complications     PATIENT STATED TEETH AS RISK OF BEING KNOCKED OUT DUE TO GUM DISEASE    History of substance use 04/29/2023    Hyperlipidemia     Kidney stones     Laceration of leg 02/2023    Leg weakness, bilateral     Low back pain     Lumbar radiculopathy     MRSA (methicillin resistant Staphylococcus aureus) 10/10/2023    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/10/23    MRSA infection 2013    toe surgery    Neck pain     Neck problem     Oxygen dependent     Wears glasses             Precautions / Limitations :   Fall precautions     Weight Bearing status, if applicable :   No Weightbearing restrictions     Living Arrangement :   House    Living Environment Comment :   No concerns    Currently Resides with :   Media planner / Significant Other    DME Available :   Straight cane     Current Home Oxygen Use :   None    Prior Level of Function including ambulation & transfers :   Independent    Subjective :   Pt. Feels weak.    Pain Rating :   Pre-Treatment Pain: 0  Post-Treatment Pain: 0  Comments:                                             Orientation &  Level of Consciousness :   Alert, Awake, Cooperative, and  Confused    Ability to Answer Questions & Follow Commands :   Simple  and Complex    6-Clicks Score :    1.) Turning from back to side while flat in bed without using a bed rail.   A little assistance (3)  2.) Moving from lying on back to sitting on the side of a flat bed without using bed rails.  A little assistance (3)  3.) Moving to and from a bed to a chair, including a wheelchair.   A little assistance (3)  4.) Standing up for chair using your arms (eg, wheelchair or bedside  chair).  A little assistance (3)  5.) Walking in hospital room.  A little assistance (3)  6.) Climbing 3-5 steps with a railing.   A little assistance (3)    6 Clicks Raw Score: 18    6 Clicks Plan: Score 18-23 - PT consult is appropriate; may need home health at discharge    Objective :    Up to chair on arrival.    Strength :    Generalized Weakness / Debility        Neuro including sensation, coordination, reflexes & tone :    No apparent deficits    Range of Motion :     Range of Motion Right   Left   Upper Extremity  Within functional limits  Within functional limits   Lower Extremity   Within functional limits  Within functional limits           Current Functional Ability :     Transfers:  Rolling in bed:    Minimal Assist  with assist of 2                                                Supine to sit:   Minimal Assist   with assist of 2                                Sit to stand:  Minimal Assist   with assist of 2       Gait:  Distance:   100'               Device Utilized:   None  or Hand Hold Assist          Level of Assist:  Minimal Assist   with assist of  2      Weight-Bearing Status:   No Weightbearing restrictions                       Gait Deviations: Balance impaired     Balance/Endurance:  Static Sitting  Balance: Fair - Requires assist to maintain balance            Static Standing  Balance:  Fair - Requires assist to maintain balance                                               Endurance:  Fair         PT Treatment  Diagnosis :   Generalized Weakness      Rehabilitation Potential :    Good     Treatment Goals :     Transfers:            Rolling in bed:    Independent with assist of Self                                                 Supine to sit:   Independent  with assist of Self  Sit to stand:  Independent  with assist of Self                                               Gait:  Distance:   100'                Device Utilized:   None          Level of Assist:      Independent with assist of  Self       Weight-Bearing Status:   No Weightbearing restrictions          Stairs:  Patient able to perform stair climbing in preparation for discharge      ROM:                           Strength:    Increase strength via therapeutic exercise for patient to accomplish stated mobility goals               Pain:                          Patient / Family Goal :   To go home     Treatment Plan  :   Gait training , Transfer training , Therapeutic exercise / Range of Motion exercises , Patient / Family education , and Evaluation only     Therapy Frequency :   3-5 times a week     Time Estimate of Goal Attainment  :   2 days    Progress Note :   Transfers and ambulates with Min of 2 for balance.    Evaluation Complexity :   Low    Low: History 0; Examination 1-2; Presentation Stable   Moderate: History 1-2; Examination 3+; Presentation Evolving   High: History 3+; Examination 4+; Presentation Evolving / Unstable     Treatment Provided This Session :   Gait training , Transfer training , Therapeutic exercise / Range of Motion exercises , Patient / Family education , and Evaluation only    Charges :     Timed Charge Number of Minutes   Neuromuscular Re-Education     Gait Training     Therapeutic Activity     Therapeutic Exercise      Total Treatment Time       Non Timed Charge Number of  Units   Physical Therapy  Evaluation Low   1   Physical Therapy  Evaluation Moderate              Physical Therapy  Evaluation  High                   Re-Evaluation               Canalith Repositioning                     Post Discharge Equipment Needs :    To Be Determined    Recommended Discharge Disposition  :   Skilled Care  -  Based on current diagnosis, functional performance prior to admission and current functional presentation, this patient requires continued Physical Therapy services in a skilled care facility in order to achieve significant functional improvements in one or more of these deficit areas:  aerobic capacity/endurance; gait, locomotion and balance; motor function; muscle performance; posture; range of motion    The above recommendation is based upon the current examination and evaluation performed on this date by this physical therapist.   As subsequent sessions are completed, recommendations will be updated accordingly.      Physical Therapy Plan of Care was discussed with patient / family ? :   Delano Metz, PT 10/12/2023 12:49

## 2023-10-12 NOTE — Care Plan (Signed)
Problem: Adult Inpatient Plan of Care  Goal: Plan of Care Review  Outcome: Ongoing (see interventions/notes)  Goal: Patient-Specific Goal (Individualized)  Outcome: Ongoing (see interventions/notes)  Goal: Absence of Hospital-Acquired Illness or Injury  Outcome: Ongoing (see interventions/notes)  Goal: Optimal Comfort and Wellbeing  Outcome: Ongoing (see interventions/notes)  Goal: Rounds/Family Conference  Outcome: Ongoing (see interventions/notes)     Problem: Fall Injury Risk  Goal: Absence of Fall and Fall-Related Injury  Outcome: Ongoing (see interventions/notes)     Problem: Alcohol Withdrawal  Goal: Alcohol Withdrawal Symptom Control  Outcome: Ongoing (see interventions/notes)  Goal: Optimal Neurologic Function  Outcome: Ongoing (see interventions/notes)  Goal: Readiness for Change Identified  Outcome: Ongoing (see interventions/notes)     Problem: Infection  Goal: Absence of Infection Signs and Symptoms  Outcome: Ongoing (see interventions/notes)     Problem: Pneumonia  Goal: Fluid Balance  Outcome: Ongoing (see interventions/notes)  Goal: Absence of Infection Signs and Symptoms  Outcome: Ongoing (see interventions/notes)  Goal: Effective Oxygenation and Ventilation  Outcome: Ongoing (see interventions/notes)     Problem: Skin Injury Risk Increased  Goal: Skin Health and Integrity  Outcome: Ongoing (see interventions/notes)     Care plan reviewed and continued.  Addison Naegeli, RN

## 2023-10-12 NOTE — OT Evaluation (Signed)
[facility]  Rehabilitation Services  Occupational Therapy Initial Evaluation    Patient Name: [name] [name]  Date of Birth: 08-22-1966  Height:  188 cm (6\' 2" )  Weight:  (!) 139 kg (307 lb)  Room/Bed: 570/A  Payor: [employer]ICAID / Plan: [employer] MEDICAID / Product Type: Medicaid [place] /     Assessment:      10/12/23 0900   Therapist Pager   OT Assigned/ Pager # Donald Pore MOT, OTR/L  [phone]   Rehab Session   Document Type evaluation   Total OT Minutes: 8   Daily Activity AM-PAC/6-clicks Score   Putting on/Taking off clothing on lower body 3   Bathing 3   Toileting 3   Putting on/Taking off clothing on upper body 3   Personal grooming 4   Eating Meals 4   Raw Score Total 20   Standardized (t-scale) Score 42.03   CMS 0-100% Score 38.32   CMS Modifier CJ       Diagnosis: pneumonia    General information:  Pt. Came in with worsening cough and SOB   Past Medical History:   Diagnosis Date    Cervical stenosis of spinal canal     Chronic gum disease     RISK OF TEETH FALLING OUT    Chronic obstructive pulmonary disease (CMS HCC) 04/29/2023    Chronic pain     COPD (chronic obstructive pulmonary disease) (CMS HCC)     CVD (cerebrovascular disease)     Exertional dyspnea 04/29/2023    Gait disturbance     Gout     History of anesthesia complications     PATIENT STATED TEETH AS RISK OF BEING KNOCKED OUT DUE TO GUM DISEASE    History of substance use 04/29/2023    Hyperlipidemia     Kidney stones     Laceration of leg 02/2023    Leg weakness, bilateral     Low back pain     Lumbar radiculopathy     MRSA (methicillin resistant Staphylococcus aureus) 10/10/2023    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/10/23    MRSA infection 2013    toe surgery    Neck pain     Neck problem     Oxygen dependent     Wears glasses          Past Surgical History:   Procedure Laterality Date    HX CERVICAL SPINE SURGERY  02/18/2023    HX FOOT SURGERY Left     jont implant left toe    HX HERNIA REPAIR Left     inguinal    LITHOTRIPSY      LUMBAR DISC  SURGERY  05/2023       Living environment and set-up: Pt. Lives alone in house with steps to enter and has tub shower      Prior level of function:Ind    Assistive devices used: none      Pain:  0   Numerical                  Restrictions/Precautions: Fall    Weight bearing status: n/a    UE assessment:   Dominant:  right     Right   Left   Upper Extremity       Tone WNL WNL   Coordination WNL WNL   AROM WNL WNL   PROM WNL WNL   Strength   4  4     Cognitive Assessment: Orientation:Person, Place,  Time, and Situation Attention:WNL/WFL  Following commands:WNL    Behavior/mood:  appropriate to situation, alert, and irritable    Visual Assessment: WNL    Current level of function:   Mobility:   Sit to stand transfer:  Minimum assistance and x 2   Chair transfer: Minimum assistance and x 2   Sitting balance:  Good  Standing balance:  Fair    ADL's:  Eating:  Set-up   Grooming:  Set-up    LB dressing:  Minimum assistance        Assistive devices used: none      Overall Impairments : impaired balance, decreased strength, decreased endurance, and pain     Evaluation complexity Justification:   Occupational Profile Review: brief history  Evaluation complexity: low    Plan:       Criteria for skilled interventions met: yes    Rehab potential:Good    OT Interventions: ADL retraining, bed mobility training, transfer training, and strengthening    To provide Occupational therapy services minimum of 3x/wk, until discharge       The risks/benefits of therapy have been discussed with the patient/caregiver and he/she is in agreement with the established plan of care.     Goals/POC:   Pt will be Contact guard with LB dressing by discharge  Pt will be Contact guard with toileting by discharge  Pt will increase BUE strength to 5/5 in order to assist with mobility by discharge  Pt will be Contact guard with transfers and mobility using least restrictive AD by discharge      Discharge Needs:   Equipment Recommendation: TBD    The patient  presents with self care/ mobility limitations due to impaired balance, impaired strength, and impaired functional activity tolerance that significantly impair/prevent patient's ability to participate in mobility-related activities of daily living (MRADLs) including  ambulation and transfers in order to safely complete, toileting.   Discharge Disposition: skilled nursing facility    Subjective & Objective:     Pt. OOB in chair and willing to participate. Pt. Completed sit to stand, transfers and functional mobility with min assist of 2 due to pt being unsteady, he demo x2 losses of balance. Pt. Repeated several times "what happened to me". Pt. Sitting in chair with all needs met and call light with in reach.   Patient educated on transfer safety and fall prevention       Therapist:   Donald Pore, OT     Total Time: 8 minutes  Charges Entered: eval  Department Number: [id number]

## 2023-10-12 NOTE — Nurses Notes (Signed)
Pt agitated and threatening to leave AMA. Pt stated that when he got tangled in his IV tubing that we were trying to "tie him down". Pt pulled IV out and turned his bedside table upside down. Dr. Lawson Fiscal notified. Nursing supervisor Karolee Stamps spoke with pt and he has agreed to stay for the night. IVT called to restart IV.

## 2023-10-13 DIAGNOSIS — Z713 Dietary counseling and surveillance: Secondary | ICD-10-CM

## 2023-10-13 DIAGNOSIS — Z7182 Exercise counseling: Secondary | ICD-10-CM

## 2023-10-13 LAB — BASIC METABOLIC PANEL
ANION GAP: 9 mmol/L (ref 5–19)
BUN/CREA RATIO: 20 (ref 6–20)
BUN: 22 mg/dL — ABNORMAL HIGH (ref 9–20)
CALCIUM: 9.1 mg/dL (ref 8.4–10.2)
CHLORIDE: 96 mmol/L — ABNORMAL LOW (ref 98–107)
CO2 TOTAL: 32 mmol/L — ABNORMAL HIGH (ref 22–30)
CREATININE: 1.1 mg/dL (ref 0.66–1.20)
ESTIMATED GFR: >60 mL/min/{1.73_m2} (ref >60)
GLUCOSE: 124 mg/dL — ABNORMAL HIGH (ref 74–106)
POTASSIUM: 3.7 mmol/L (ref 3.5–5.1)
SODIUM: 137 mmol/L (ref 137–145)

## 2023-10-13 LAB — CLOSTRIDIUM DIFFICILE TOXIN DETECTION
C. DIFFICILE INTERPRETATION: NEGATIVE
C. DIFFICILE TOXIN GENE, PCR: NEGATIVE

## 2023-10-13 LAB — URINALYSIS, MACRO/MICRO
BILIRUBIN: NEGATIVE mg/dL
BLOOD: NEGATIVE mg/dL
GLUCOSE: NEGATIVE mg/dL
KETONES: NEGATIVE mg/dL
LEUKOCYTES: NEGATIVE WBCs/uL
NITRITE: NEGATIVE
PH: 6 (ref 5.0–9.0)
PROTEIN: 20 mg/dL
SPECIFIC GRAVITY: >1.03 (ref 1.003–1.035)
UROBILINOGEN: <2 mg/dL (ref <2.0)

## 2023-10-13 LAB — DRUG SCREEN, WITH CONFIRMATION, URINE
AMPHETAMINES URINE: NEGATIVE
BARBITURATES URINE: NEGATIVE
BENZODIAZEPINES URINE: POSITIVE — AB
BUPRENORPHINE URINE: POSITIVE — AB
CANNABINOIDS URINE: NEGATIVE
COCAINE METABOLITES URINE: NEGATIVE
FENTANYL, RANDOM URINE: NEGATIVE
METHADONE URINE: NEGATIVE
OPIATES URINE (LOW CUTOFF): NEGATIVE
OXYCODONE URINE: NEGATIVE
PCP URINE: NEGATIVE

## 2023-10-13 LAB — CBC
HCT: 38.9 % (ref 36.0–46.0)
HGB: 13 g/dL — ABNORMAL LOW (ref 13.9–16.3)
MCH: 34.8 pg — ABNORMAL HIGH (ref 25.4–34.0)
MCHC: 33.3 g/dL (ref 30.0–37.0)
MCV: 104.5 fL — ABNORMAL HIGH (ref 80.0–100.0)
MPV: 7.5 fL (ref 7.5–11.5)
PLATELETS: 260 10*3/uL (ref 130–400)
RBC: 3.72 10*6/uL — ABNORMAL LOW (ref 4.30–5.90)
RDW: 16.3 % — ABNORMAL HIGH (ref 11.5–14.0)
WBC: 8.1 10*3/uL (ref 4.5–11.5)

## 2023-10-13 LAB — RESPIRATORY CULTURE AND GRAM STAIN (PERFORMABLE): RESPIRATORY CULTURE: NORMAL

## 2023-10-13 LAB — STREPTOCOCCUS PNEUMONIAE ANTIGEN,URINE: S.PNEUMONIA ANTIGEN: NEGATIVE

## 2023-10-13 LAB — MICRO HOLD

## 2023-10-13 LAB — LEGIONELLA URINE ANTIGEN: LEGIONELLA ANTIGEN: NEGATIVE

## 2023-10-13 LAB — URINE HOLD

## 2023-10-13 MED ORDER — LOPERAMIDE 2 MG CAPSULE
2.0000 mg | ORAL_CAPSULE | ORAL | Status: DC | PRN
Start: 2023-10-13 — End: 2023-10-14
  Administered 2023-10-13: 2 mg via ORAL
  Filled 2023-10-13: qty 1

## 2023-10-13 NOTE — Progress Notes (Addendum)
HOSPITAL       HOSPITALIST PROGRESS NOTE      Date of Service:  10/13/2023  [name] [name], [name] [name], 57 y.o. male  Date of Admission:  10/10/2023  Date of Birth:  12-15-1965  PCP: Cecilie Kicks, DO    Chief Complaint: pneumonia, sepsis     Subjective:  patient is stable. Back to baseline 2L NC prn. IVF stopped. On zosyn, zyvox and azithro. Denies any symptoms at this time. On and off withdrawal symptoms. ID following. US abdomen pending    Review of systems other than the above were negative     Current Facility-Administered Medications   Medication Dose Route Frequency    acetaminophen (TYLENOL) tablet  650 mg Oral Q6H PRN    allopurinol (ZYLOPRIM) tablet  100 mg Oral 2x/day    amLODIPine (NORVASC) tablet  10 mg Oral Daily    azithromycin (ZITHROMAX) 500 mg in NS 250 mL IVPB  500 mg Intravenous Q24H    benzonatate (TESSALON) capsule  100 mg Oral Q8H    budesonide (PULMICORT RESPULES) 0.5 mg/2 mL nebulizer suspension  0.5 mg Nebulization 2x/day PRN    buPROPion (WELLBUTRIN XL) 24 hr extended release tablet  150 mg Oral Daily    diphenhydrAMINE (BENADRYL) capsule  25 mg Oral HS PRN    docusate sodium (COLACE) capsule  100 mg Oral 2x/day PRN    furosemide (LASIX) 10 mg/mL injection  20 mg Intravenous Daily    guaiFENesin (MUCINEX) extended release tablet - for cough (expectorant)  600 mg Oral 2x/day    heparin 5,000 unit/mL injection  5,000 Units Subcutaneous Q8HRS    hydrALAZINE (APRESOLINE) injection 5 mg  5 mg Intravenous Q6H PRN    ibuprofen (MOTRIN) tablet  800 mg Oral 4x/day PRN    ipratropium-albuterol 0.5 mg-3 mg(2.5 mg base)/3 mL Solution for Nebulization  3 mL Nebulization Q4H PRN    linezolid (ZYVOX) tablet  600 mg Oral 2x/day    LORazepam (ATIVAN) tablet  1 mg Oral Q2H PRN    magnesium oxide (MAG-OX) 400mg  (241.3 mg elemental magnesium) tablet  400 mg Oral Daily    melatonin tablet  6 mg Oral NIGHTLY    methylPREDNISolone sod succ (SOLU-medrol) 40 mg/mL injection  40 mg Intravenous Daily    mupirocin  (BACTROBAN) 2% topical ointment   Apply Topically 2x/day    nicotine (NICODERM CQ) transdermal patch (mg/24 hr)  21 mg Transdermal Daily    NS flush syringe  10 mL Intracatheter Q8HRS    NS flush syringe  10 mL Intracatheter Q1H PRN    ondansetron (ZOFRAN) 2 mg/mL injection  4 mg Intravenous Q6H PRN    PHENobarbital tablet  97.2 mg Oral 3x/day    [START ON 10/14/2023] PHENobarbital tablet  64.8 mg Oral 3x/day    [START ON 10/16/2023] PHENobarbital tablet  32.4 mg Oral 3x/day    piperacillin-tazobactam (ZOSYN) 4.5 g in iso-osmotic 100 mL premix IVPB  4.5 g Intravenous Q8H    thiamine-vitamin B1 tablet  100 mg Oral Daily        Allergies   Allergen Reactions    Naproxen Nausea/ Vomiting             Filed Vitals:    10/12/23 2111 10/13/23 0427 10/13/23 0428 10/13/23 0831   BP: (!) 158/93  126/74 (!) 152/84   Pulse: (!) 101  98 84   Resp:    19   Temp: 36.8 C (98.3 F) 36.3 C (97.4 F)  37.1 C (98.7  F)   SpO2: 90%  91% 91%     O2 delivery: None (Room Air)      Physical Exam:  General:  no acute distress, on 2L prn baseline. pleasant   Skin: no lesions or rashes   HEENT: PERRLA, EOMI, NC/AT, no scleral icterus. Normal neck, no adenopathy, no thyromegaly, no mass, normal lips, no nasal discharge.   Respiratory:  normal breathing, wheezing and rhonchis b/l no respiratory distress improving. Diminished   Cardiovascular:  RRR, no murmur, no edema, pulses intact  Gastrointestinal: normal abdomen, normal active bowel sounds. No mass or tenderness.   Musculoskeletal: no cyanosis or edema.   Neurological: AAO x 3, normal strength and motor function  Genitourinary:  No incontinence or Foley catheter   Psych: no depression or anxiety        Laboratory Data:     Results for orders placed or performed during the hospital encounter of 10/10/23 (from the past 24 hour(s))   AMMONIA   Result Value Ref Range    AMMONIA 13 9 - 30 umol/L   GOLD TOP TUBE   Result Value Ref Range    RAINBOW/EXTRA TUBE AUTO RESULT Yes    LAVENDER TOP TUBE    Result Value Ref Range    RAINBOW/EXTRA TUBE AUTO RESULT Yes    LEGIONELLA URINE ANTIGEN    Specimen: Urine, Site not specified   Result Value Ref Range    LEGIONELLA ANTIGEN Negative Negative, Indeterminate   STREPTOCOCCUS PNEUMONIAE ANTIGEN,URINE    Specimen: Urine, Site not specified   Result Value Ref Range    S.PNEUMONIA ANTIGEN Negative Negative, Indeterminate   DRUG SCREEN, WITH CONFIRMATION, URINE   Result Value Ref Range    AMPHETAMINES URINE Negative Negative    BARBITURATES URINE Negative Negative    BENZODIAZEPINES URINE Positive (A) Negative    BUPRENORPHINE URINE Positive (A) Negative    COCAINE METABOLITES URINE Negative Negative    FENTANYL, RANDOM URINE Negative Negative    OPIATES URINE (LOW CUTOFF) Negative Negative    OXYCODONE URINE Negative Negative    PCP URINE Negative Negative    CANNABINOIDS URINE Negative Negative    METHADONE URINE Negative Negative   URINALYSIS, MACRO/MICRO   Result Value Ref Range    COLOR Yellow     APPEARANCE Clear Clear, Hazy, Cloudy, Slightly Cloudy    SPECIFIC GRAVITY >1.030 1.003 - 1.035    PH 6.0 5.0 - 9.0    LEUKOCYTES Negative Negative WBCs/uL    NITRITE Negative Negative    PROTEIN 20 Negative, 10 , 20  mg/dL    GLUCOSE Negative 30 , Negative mg/dL    KETONES Negative Negative mg/dL    UROBILINOGEN < 2.0 < 2.0, 1.0, 0.2  mg/dL    BILIRUBIN Negative Negative mg/dL    BLOOD Negative Negative, Trace mg/dL    RBCS 0-2 0-2, None /hpf    WBCS 0-4 0 - 4 /hpf   MICRO HOLD   Result Value Ref Range    RAINBOW/EXTRA TUBE AUTO RESULT Yes    CBC - AM ONCE   Result Value Ref Range    WBC 8.1 4.5 - 11.5 x10^3/uL    RBC 3.72 (L) 4.30 - 5.90 x10^6/uL    HGB 13.0 (L) 13.9 - 16.3 g/dL    HCT 32.4 40.1 - 02.7 %    MCV 104.5 (H) 80.0 - 100.0 fL    MCH 34.8 (H) 25.4 - 34.0 pg    MCHC 33.3 30.0 - 37.0 g/dL  RDW 16.3 (H) 11.5 - 14.0 %    PLATELETS 260 130 - 400 x10^3/uL    MPV 7.5 7.5 - 11.5 fL   BASIC METABOLIC PANEL - AM ONCE   Result Value Ref Range    SODIUM 137 137 - 145  mmol/L    POTASSIUM 3.7 3.5 - 5.1 mmol/L    CHLORIDE 96 (L) 98 - 107 mmol/L    CO2 TOTAL 32 (H) 22 - 30 mmol/L    ANION GAP 9 5 - 19 mmol/L    CALCIUM 9.1 8.4 - 10.2 mg/dL    GLUCOSE 540 (H) 74 - 106 mg/dL    BUN 22 (H) 9 - 20 mg/dL    CREATININE 9.81 1.91 - 1.20 mg/dL    BUN/CREA RATIO 20 6 - 20    ESTIMATED GFR >60 >60 mL/min/1.24m^2       Imaging Studies:    XR AP MOBILE CHEST   Final Result by Edi, Radresults In (12/07 1057)   Bilateral airspace opacities appear improved.            Radiologist location ID: [id number]         FLUORO ESOPHAGRAM,MODIFIED SWALLOW (SPEECH PERFORMED)   Final Result by Herminio Commons, RT (R) (12/06 6578)      CT ABDOMEN PELVIS WO IV CONTRAST   Final Result by Edi, Radresults In (12/06 0107)   Enlarged fatty infiltrated liver. Atelectasis in the lung bases.      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 Recommend outpatient MRI of the pancreas are better characterization.          One or more dose reduction techniques were used (e.g., Automated exposure control, adjustment of the mA and/or kV according to patient size, use of iterative reconstruction technique).         Radiologist location ID: [id number]         XR AP MOBILE CHEST   Final Result by Edi, Radresults In (12/05 1354)   Persistent bilateral infiltrates/atelectasis similar to the prior exam            Radiologist location ID: [id number]         CT ANGIO CHEST FOR PULMONARY EMBOLUS W IV CONTRAST   Final Result by Edi, Radresults In (12/05 2440)   1. NO PULMONARY EMBOLUS   2. PATCHY OPACITIES IN THE LEFT UPPER LOBE LIKELY DUE TO PNEUMONIA   3. 1.8 CM OVAL-SHAPED NODULAR DENSITY LEFT UPPER LOBE COULD BE DUE TO PNEUMONIA BUT WARRANTS SHORT-TERM FOLLOW-UP CT SCANNING IN 3 MONTHS TO CONFIRM RESOLUTION AND EXCLUDE A PULMONARY NODULE   4. 9 MM RIGHT UPPER LOBE LUNG NODULE IS NEW COMPARED TO 02/15/2023. RECOMMEND SHORT-TERM FOLLOW-UP CT SCANNING IN 3 MONTHS   5. SUBTLE PERIBRONCHIAL OPACITIES IN THE RIGHT  UPPER LOBE COULD BE INFECTIOUS OR INFLAMMATORY IN ETIOLOGY   6. RIGHT MIDDLE LOBE AND RIGHT LOWER LOBE ATELECTASIS         One or more dose reduction techniques were used (e.g., Automated exposure control, adjustment of the mA and/or kV according to patient size, use of iterative reconstruction technique).         Radiologist location ID: [id number]         XR CHEST PA AND LATERAL   Final Result by Edi, Radresults In (12/05 0201)   NO ACUTE FINDINGS.         Radiologist location ID: [id number]             Assessment/Plan:  Active Hospital Problems   (*Primary Problem)     Diagnosis    *Pneumonia    Sepsis (CMS HCC)    Acute hypoxic respiratory failure (CMS HCC)    Vapes nicotine containing substance    History of substance use    Chronic obstructive pulmonary disease (CMS HCC)      Severe sepsis 2/2 acute bacterial PNA from suspected gram +/-/atypical organisms  Lactic acidosis -improved  Meets sepsis criteria given tachycardia, tachypnea, and pulmonary source; severe w/ elevated lactate  COVID and flu ruled out  Continue tx with Zithromax and zosyn now. Stopped unasyn  Received 30 cc/kilogram IV fluid bolus, IVF stopped  Blood cultures and sputum cultures ngtd. pending urine legionella, urine strep, and mrsa nares + started bactroban and elevated procalcitonin  CTA chest: IMPRESSION: 1.NO PULMONARY EMBOLUS. 2.PATCHY OPACITIES IN THE LEFT UPPER LOBE LIKELY DUE TO PNEUMONIA. 3.1.8 CM OVAL-SHAPED NODULAR DENSITY LEFT UPPER LOBE COULD BE DUE TO PNEUMONIA BUT WARRANTS SHORT-TERM FOLLOW-UP CT SCANNING IN 3 MONTHS TO CONFIRM RESOLUTION AND EXCLUDE A PULMONARY NODULE. 4.9 MM RIGHT UPPER LOBE LUNG NODULE IS NEW COMPARED TO 02/15/2023. RECOMMEND SHORT-TERM FOLLOW-UP CT SCANNING IN 3 MONTHS. 5.SUBTLE PERIBRONCHIAL OPACITIES IN THE RIGHT UPPER LOBE COULD BE INFECTIOUS OR INFLAMMATORY IN ETIOLOGY. 6.RIGHT MIDDLE LOBE AND RIGHT LOWER LOBE ATELECTASIS  ID consulted added po zyvox  -speech following. MBS passed but with  aspiration precautions. No signs currently  CT ab/pelvis: IMPRESSION: Enlarged fatty infiltrated liver. Atelectasis in the lung bases. 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 Recommend outpatient MRI of the pancreas are better characterization.  GI consulted and NTD f/u outpt       Acute on chronic COPD exacerbation secondary to bacterial pneumonia  Duoneb treatments q.4 hours   Solu-Medrol 40 mg IV push daily x5 days   Azithromycin 500 IV daily x5 days  Supplemental O2 PRN   Tessalon Perles 100 mg q.8 hours and Mucinex      Acute on chronic hypoxic respiratory failure from above  CTA negative for PE  Continue supplemental O2 therapy and attempt to wean as tolerated. On 2L NC prn at home on 6 here now back to baseline  Duonoebs, steroids     Hypomagnesemia -resolved   Received 2 gm IV mag on admission     Macrocytic anemia  Likely r/t alcohol   B12 and folate wnl     Essential HTN uncontrolled now improved   Not on meds started Norvasc increased to 10 mg daily  IV hydralazine PRN     Lung nodule  Noted on CT  Outpatient surveillance recommended per PCP or pulmonary     Transaminitis   Chronic, stable, secondary to alcohol use and fatty liver  US abdomen pending. Ammonia wnl     Alcohol use disorder with withdrawal concerns  Last alcoholic drink 12/5  Alcohol cessation advised  CIWA protocol in place  Prn and scheduled ativan to now phenobarbital     Opioid use disorder  Gets Suboxone off the street, not currently prescribed it     Tobacco use disorder  Smoking cessation discussed   Nicotine patch provided     Carpal tunnel syndrome  Post surgery with Dr. 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 Generalized weakness/physical debility  Pt/ot/ss following. CM consulted    20. B/l leg/feet edema  Suspected due to IVF now stopped  Echo 3/24 preserved EF  Lasix 20 mg daily started and 40 mg IV once  Venous duplex neg  No need for cardio     Remaining medical comorbidities are stable and home meds have been resumed accordingly.     Code status: FULL CODE: ATTEMPT RESUSCITATION/CPR  DVT prophylaxis: Heparin SQ        Dispo: monitor labs and vitals. Gen sug and ID consulted. Monitor breathing. Weaned O2 to baseline 2L prn. Follow cultures. Monitor for signs of withdrawal. US abdomen pending. Perhaps DC home tomorrow      Florestine Avers, MD      Interval update: having diarrhea, c. Diff pending if neg give prn imodium

## 2023-10-13 NOTE — Care Plan (Signed)
[facility]  Care Plan Note    Situation: Pt had negative c-diff result this evening. Imodium started for diarrhea.     Intervention: Pt continues on tid Phenobarbitol. Requested his PRN Ativan dose for continued anxiety tonight.     Response: Continues on IV Zosyn and Zithromax.       Roi Jafari, RN

## 2023-10-13 NOTE — Care Plan (Signed)
Problem: Adult Inpatient Plan of Care  Goal: Plan of Care Review  Outcome: Ongoing (see interventions/notes)  Goal: Patient-Specific Goal (Individualized)  Outcome: Ongoing (see interventions/notes)  Goal: Absence of Hospital-Acquired Illness or Injury  Outcome: Ongoing (see interventions/notes)  Goal: Optimal Comfort and Wellbeing  Outcome: Ongoing (see interventions/notes)  Goal: Rounds/Family Conference  Outcome: Ongoing (see interventions/notes)     Problem: Fall Injury Risk  Goal: Absence of Fall and Fall-Related Injury  Outcome: Ongoing (see interventions/notes)     Problem: Alcohol Withdrawal  Goal: Alcohol Withdrawal Symptom Control  Outcome: Ongoing (see interventions/notes)  Goal: Optimal Neurologic Function  Outcome: Ongoing (see interventions/notes)  Goal: Readiness for Change Identified  Outcome: Ongoing (see interventions/notes)     Problem: Infection  Goal: Absence of Infection Signs and Symptoms  Outcome: Ongoing (see interventions/notes)     Problem: Pneumonia  Goal: Fluid Balance  Outcome: Ongoing (see interventions/notes)  Goal: Absence of Infection Signs and Symptoms  Outcome: Ongoing (see interventions/notes)  Goal: Effective Oxygenation and Ventilation  Outcome: Ongoing (see interventions/notes)     Problem: Skin Injury Risk Increased  Goal: Skin Health and Integrity  Outcome: Ongoing (see interventions/notes)

## 2023-10-14 ENCOUNTER — Inpatient Hospital Stay (HOSPITAL_COMMUNITY): Payer: MEDICAID

## 2023-10-14 DIAGNOSIS — F39 Unspecified mood [affective] disorder: Secondary | ICD-10-CM

## 2023-10-14 DIAGNOSIS — F1120 Opioid dependence, uncomplicated: Secondary | ICD-10-CM

## 2023-10-14 DIAGNOSIS — M4802 Spinal stenosis, cervical region: Secondary | ICD-10-CM

## 2023-10-14 DIAGNOSIS — E66812 Obesity, class 2: Secondary | ICD-10-CM

## 2023-10-14 DIAGNOSIS — F10939 Alcohol use, unspecified with withdrawal, unspecified (CMS HCC): Secondary | ICD-10-CM

## 2023-10-14 DIAGNOSIS — R197 Diarrhea, unspecified: Secondary | ICD-10-CM

## 2023-10-14 DIAGNOSIS — Z136 Encounter for screening for cardiovascular disorders: Secondary | ICD-10-CM

## 2023-10-14 DIAGNOSIS — Z981 Arthrodesis status: Secondary | ICD-10-CM

## 2023-10-14 DIAGNOSIS — M1A9XX0 Chronic gout, unspecified, without tophus (tophi): Secondary | ICD-10-CM

## 2023-10-14 LAB — HEPATIC FUNCTION PANEL
ALBUMIN/GLOBULIN RATIO: 1.2 — ABNORMAL LOW (ref 1.5–2.5)
ALBUMIN/GLOBULIN RATIO: 1.2 — ABNORMAL LOW (ref 1.5–2.5)
ALBUMIN: 3.5 g/dL (ref 3.5–5.0)
ALBUMIN: 4 g/dL (ref 3.5–5.0)
ALKALINE PHOSPHATASE: 93 U/L (ref 38–126)
ALKALINE PHOSPHATASE: 94 U/L (ref 38–126)
ALT (SGPT): 128 U/L — ABNORMAL HIGH (ref <50)
ALT (SGPT): 178 U/L — ABNORMAL HIGH (ref <50)
AST (SGOT): 186 U/L — ABNORMAL HIGH (ref 17–59)
AST (SGOT): 318 U/L — ABNORMAL HIGH (ref 17–59)
BILIRUBIN DIRECT: 0.7 mg/dL — ABNORMAL HIGH (ref 0.0–0.3)
BILIRUBIN DIRECT: 0.8 mg/dL — ABNORMAL HIGH (ref 0.0–0.3)
BILIRUBIN TOTAL: 1.1 mg/dL (ref 0.2–1.3)
BILIRUBIN TOTAL: 1.3 mg/dL (ref 0.2–1.3)
PROTEIN TOTAL: 6.4 g/dL (ref 6.3–8.2)
PROTEIN TOTAL: 7.4 g/dL (ref 6.3–8.2)

## 2023-10-14 LAB — ECG 12 LEAD
Atrial Rate: 89 {beats}/min
Calculated P Axis: 68 degrees
Calculated R Axis: 42 degrees
Calculated T Axis: 58 degrees
PR Interval: 156 ms
QRS Duration: 90 ms
QT Interval: 392 ms
QTC Calculation: 476 ms
Ventricular rate: 89 {beats}/min

## 2023-10-14 LAB — BASIC METABOLIC PANEL
ANION GAP: 5 mmol/L (ref 5–19)
BUN/CREA RATIO: 20 (ref 6–20)
BUN: 21 mg/dL — ABNORMAL HIGH (ref 9–20)
CALCIUM: 8.6 mg/dL (ref 8.4–10.2)
CHLORIDE: 99 mmol/L (ref 98–107)
CO2 TOTAL: 33 mmol/L — ABNORMAL HIGH (ref 22–30)
CREATININE: 1.04 mg/dL (ref 0.66–1.20)
ESTIMATED GFR: >60 mL/min/{1.73_m2} (ref >60)
GLUCOSE: 100 mg/dL (ref 74–106)
POTASSIUM: 3.7 mmol/L (ref 3.5–5.1)
SODIUM: 137 mmol/L (ref 137–145)

## 2023-10-14 LAB — CBC
HCT: 38.6 % (ref 36.0–46.0)
HGB: 13 g/dL — ABNORMAL LOW (ref 13.9–16.3)
MCH: 35.3 pg — ABNORMAL HIGH (ref 25.4–34.0)
MCHC: 33.7 g/dL (ref 30.0–37.0)
MCV: 104.8 fL — ABNORMAL HIGH (ref 80.0–100.0)
MPV: 7.2 fL — ABNORMAL LOW (ref 7.5–11.5)
PLATELETS: 232 10*3/uL (ref 130–400)
RBC: 3.68 10*6/uL — ABNORMAL LOW (ref 4.30–5.90)
RDW: 15.6 % — ABNORMAL HIGH (ref 11.5–14.0)
WBC: 7.2 10*3/uL (ref 4.5–11.5)

## 2023-10-14 MED ORDER — BENZONATATE 100 MG CAPSULE
100.0000 mg | ORAL_CAPSULE | Freq: Three times a day (TID) | ORAL | 0 refills | Status: AC | PRN
Start: 2023-10-14 — End: 2023-10-28

## 2023-10-14 MED ORDER — NICOTINE 21 MG/24 HR DAILY TRANSDERMAL PATCH
21.0000 mg | MEDICATED_PATCH | Freq: Every day | TRANSDERMAL | 0 refills | Status: AC
Start: 2023-10-15 — End: 2023-11-14

## 2023-10-14 MED ORDER — MAGNESIUM OXIDE 400 MG (241.3 MG MAGNESIUM) TABLET
400.0000 mg | ORAL_TABLET | Freq: Every day | ORAL | Status: AC
Start: 2023-10-15 — End: ?

## 2023-10-14 MED ORDER — IPRATROPIUM 0.5 MG-ALBUTEROL 3 MG (2.5 MG BASE)/3 ML NEBULIZATION SOLN
3.0000 mL | INHALATION_SOLUTION | RESPIRATORY_TRACT | 0 refills | Status: AC | PRN
Start: 2023-10-14 — End: 2023-11-13

## 2023-10-14 MED ORDER — DOXYCYCLINE HYCLATE 100 MG CAPSULE
100.0000 mg | ORAL_CAPSULE | Freq: Two times a day (BID) | ORAL | 0 refills | Status: AC
Start: 2023-10-14 — End: 2023-10-19

## 2023-10-14 MED ORDER — GUAIFENESIN ER 600 MG TABLET, EXTENDED RELEASE 12 HR
600.0000 mg | EXTENDED_RELEASE_TABLET | Freq: Two times a day (BID) | ORAL | 0 refills | Status: AC | PRN
Start: 2023-10-14 — End: 2023-10-28

## 2023-10-14 MED ORDER — AMLODIPINE 10 MG TABLET
10.0000 mg | ORAL_TABLET | Freq: Every day | ORAL | 0 refills | Status: DC
Start: 2023-10-15 — End: 2023-11-27

## 2023-10-14 MED ORDER — LOPERAMIDE 2 MG CAPSULE
2.0000 mg | ORAL_CAPSULE | ORAL | Status: AC | PRN
Start: 2023-10-14 — End: ?

## 2023-10-14 MED ORDER — DOCUSATE SODIUM 100 MG CAPSULE
100.0000 mg | ORAL_CAPSULE | Freq: Two times a day (BID) | ORAL | Status: AC | PRN
Start: 2023-10-14 — End: ?

## 2023-10-14 MED ORDER — THIAMINE MONONITRATE (VITAMIN B1) 100 MG TABLET
100.0000 mg | ORAL_TABLET | Freq: Every day | ORAL | 0 refills | Status: DC
Start: 2023-10-15 — End: 2023-11-22

## 2023-10-14 MED ORDER — LEVOFLOXACIN 750 MG TABLET
750.0000 mg | ORAL_TABLET | Freq: Every day | ORAL | 0 refills | Status: AC
Start: 2023-10-14 — End: 2023-10-19

## 2023-10-14 MED ORDER — FOLIC ACID 1 MG TABLET
1.0000 mg | ORAL_TABLET | Freq: Every day | ORAL | 0 refills | Status: DC
Start: 2023-10-14 — End: 2023-11-27

## 2023-10-14 NOTE — Progress Notes (Signed)
[facility] Progress Note  [facility]    Patient Name: [name] [name] Number: [id number]  Date of Service: 10/14/2023  Date of Birth: Apr 02, 1966    Hospital Day:  LOS: 4 days     Assessment:  74M COPD, substance use, admitted w SOB concern for pneumonia vs aspiration, culture w scant GNR, clinically improving    Pneumonia/aspiration vs COPD exacerbation  -on room air at present  -feeling better    Recommendations:  DC azithromycin - s/p 5 days  Zosyn 4.5g IV Q8  Linezolid 600 mg BID    On DC, may transition to   PO Levofloxacin 750mg  daily + Doxycycline 100 mg BID  -5 more days    Cross covering for Dr Celine Ahr    Discussed w Dr Harold Hedge    On the day of the encounter, a total of  30 minutes was spent on this patient encounter including review of historical information, examination, documentation and post-visit activities. The time documented excludes procedural time.  MDM Level  Problems addressed: MODERATE (>or=1 chronic illness w exacerbation, progression, or treatment side-effects; >or= 2 stable chronic illnesses; 1 undiagnosed new problem w uncertain prognosis; 1 acute illness w systemic symptoms; 1 acute complicated injury): pneumonia  Data reviewed & analyzed: MODERATE: 1 of 3 following: review of external notes, review of tests results, ordering of tests, assessment requiring an independent historian; independent interpretation of a test performed by another provider not separately reported; discussion of management or test interpretation w external provider:   Chart, labs, cultures reviewed.  Discussed w primary team  Risk of morbidity: MODERATE (ex: prescription drug management, decision regarding minor surgery or elective major surgery, diagnosis or treatment limited by social determinants of health): abx    Erick Blinks, MD  [facility]  Office: [phone]  Fax: [phone]    Subjective     Subjective:  Current antibiotics:  azith zosyn linezolid    Seen and  examined  No acute events overnight  Less withdrawal sx, states diarrhea is improving  On room air, breathing better - felt better w breathing treatment    Objective     Objective:  Physical Exam:  Current Vitals: BP 119/74   Pulse 87   Temp 36.4 C (97.5 F)   Resp 18   Ht 1.88 m (6\' 2" )   Wt (!) 139 kg (307 lb)   SpO2 92%   BMI 39.42 kg/m       Vitals in last 24 hours: Temp  Avg: 36.6 C (97.8 F)  Min: 36.4 C (97.5 F)  Max: 36.7 C (98 F)  MAP (Non-Invasive)  Avg: 97.3 mmHG  Min: 89 mmHG  Max: 117 mmHG  Pulse  Avg: 94.5  Min: 77  Max: 107  Resp  Avg: 18  Min: 18  Max: 18  SpO2  Avg: 92.3 %  Min: 90 %  Max: 94 %    Physical Exam  Vitals reviewed.   Constitutional:       General: He is not in acute distress.     Appearance: He is not ill-appearing or toxic-appearing.   Eyes:      Conjunctiva/sclera: Conjunctivae normal.      Pupils: Pupils are equal, round, and reactive to light.   Cardiovascular:      Rate and Rhythm: Normal rate and regular rhythm.      Pulses: Normal pulses.      Heart sounds: Normal heart sounds. No murmur heard.  Pulmonary:      Effort: Pulmonary effort is normal. No respiratory distress.      Breath sounds: Normal breath sounds.      Comments: Room air  Abdominal:      General: Bowel sounds are normal. There is no distension.      Palpations: Abdomen is soft.      Tenderness: There is no abdominal tenderness.   Musculoskeletal:         General: No swelling or tenderness. Normal range of motion.   Skin:     General: Skin is warm.   Neurological:      General: No focal deficit present.      Mental Status: He is alert and oriented to person, place, and time. Mental status is at baseline.   Psychiatric:         Mood and Affect: Mood normal.         Behavior: Behavior normal.          Inpatient Medications:  acetaminophen (TYLENOL) tablet, 650 mg, Oral, Q6H PRN  allopurinol (ZYLOPRIM) tablet, 100 mg, Oral, 2x/day  amLODIPine (NORVASC) tablet, 10 mg, Oral, Daily  benzonatate (TESSALON)  capsule, 100 mg, Oral, Q8H  budesonide (PULMICORT RESPULES) 0.5 mg/2 mL nebulizer suspension, 0.5 mg, Nebulization, 2x/day PRN  buPROPion (WELLBUTRIN XL) 24 hr extended release tablet, 150 mg, Oral, Daily  diphenhydrAMINE (BENADRYL) capsule, 25 mg, Oral, HS PRN  docusate sodium (COLACE) capsule, 100 mg, Oral, 2x/day PRN  furosemide (LASIX) 10 mg/mL injection, 20 mg, Intravenous, Daily  guaiFENesin (MUCINEX) extended release tablet - for cough (expectorant), 600 mg, Oral, 2x/day  heparin 5,000 unit/mL injection, 5,000 Units, Subcutaneous, Q8HRS  hydrALAZINE (APRESOLINE) injection 5 mg, 5 mg, Intravenous, Q6H PRN  ibuprofen (MOTRIN) tablet, 800 mg, Oral, 4x/day PRN  ipratropium-albuterol 0.5 mg-3 mg(2.5 mg base)/3 mL Solution for Nebulization, 3 mL, Nebulization, Q4H PRN  linezolid (ZYVOX) tablet, 600 mg, Oral, 2x/day  loperamide (IMODIUM) capsule, 2 mg, Oral, Q4H PRN  LORazepam (ATIVAN) tablet, 1 mg, Oral, Q2H PRN  magnesium oxide (MAG-OX) 400mg  (241.3 mg elemental magnesium) tablet, 400 mg, Oral, Daily  melatonin tablet, 6 mg, Oral, NIGHTLY  mupirocin (BACTROBAN) 2% topical ointment, , Apply Topically, 2x/day  nicotine (NICODERM CQ) transdermal patch (mg/24 hr), 21 mg, Transdermal, Daily  NS flush syringe, 10 mL, Intracatheter, Q8HRS  NS flush syringe, 10 mL, Intracatheter, Q1H PRN  ondansetron (ZOFRAN) 2 mg/mL injection, 4 mg, Intravenous, Q6H PRN  PHENobarbital tablet, 64.8 mg, Oral, 3x/day  [START ON 10/16/2023] PHENobarbital tablet, 32.4 mg, Oral, 3x/day  piperacillin-tazobactam (ZOSYN) 4.5 g in iso-osmotic 100 mL premix IVPB, 4.5 g, Intravenous, Q8H  thiamine-vitamin B1 tablet, 100 mg, Oral, Daily        Current Antimicrobials:  Antibiotics (From admission, onward)      Start     Stop Route Frequency    10/11/23 0600  piperacillin-tazobactam (ZOSYN) 4.5 g in iso-osmotic 100 mL premix IVPB  (piperacillin-tazobactam (ZOSYN) IVPB load & maintenance dose)         -- IV EVERY 8 HOURS    10/10/23 2300   piperacillin-tazobactam (ZOSYN) 4.5 g in iso-osmotic 100 mL premix IVPB  (piperacillin-tazobactam (ZOSYN) IVPB load & maintenance dose)         10/10/23 2347 IV NOW    10/10/23 2100  mupirocin (BACTROBAN) 2% topical ointment  (Mupirocin (BACTROBAN) Nasal Protocol)  Status:  Discontinued         10/10/23 1621 EACH NOSTRIL 2 TIMES DAILY    10/10/23 2100  mupirocin (BACTROBAN) 2% topical ointment         10/15/23 2059 APPLY TOPICA 2 TIMES DAILY    10/10/23 1800  linezolid (ZYVOX) tablet         -- Oral 2 TIMES DAILY    10/10/23 1100  ampicillin-sulbactam (UNASYN) 3 g in NS 100 mL IVPB - DOCKED  Status:  Discontinued         10/10/23 2222 IV EVERY 6 HOURS    10/10/23 0700  azithromycin (ZITHROMAX) 500 mg in NS 250 mL IVPB         10/14/23 0924 IV EVERY 24 HOURS    10/10/23 0500  ampicillin-sulbactam (UNASYN) 3 g in NS 100 mL IVPB - DOCKED         10/10/23 0524 IV NOW    10/10/23 0500  doxycycline hyclate 100 mg in NS 100 mL IVPB - DOCKED         10/10/23 0624 IV NOW             Lines:  Patient Lines/Drains/Airways Status       Active Line / Dialysis Catheter / Dialysis Graft / Drain / Airway / Wound       Name Placement date Placement time Site Days    Peripheral IV Posterior;Right Dorsal Metacarpals  (top of hand) 10/12/23  1535  -- 1    Wound  Incision Medial Back 05/16/23  1800  -- 150    Wound  Incision Left Wrist 09/23/23  --  -- 21                     Laboratory Studies:  CBC Differential   Recent Labs     10/12/23  0658 10/13/23  0735 10/14/23  0743   WBC 10.5 8.1 7.2   HGB 13.2* 13.0* 13.0*   HCT 38.7 38.9 38.6   PLTCNT 268 260 232    No results for input(s): "PMNS", "LYMPHOCYTES", "MONOCYTES", "EOSINOPHIL", "BASOPHILS", "NRBCS", "PMNABS", "LYMPHSABS", "MONOSABS", "EOSABS", "BASOSABS" in the last 72 hours.   BMP LFTs   Recent Labs     10/14/23  0743   SODIUM 137   POTASSIUM 3.7   CHLORIDE 99   CO2 33*   BUN 21*   CREATININE 1.04   GLUCOSENF 100   ANIONGAP 5   BUNCRRATIO 20   GFR >60   CALCIUM 8.6   ALBUMIN 3.5     Recent Labs     10/14/23  0743   TOTALPROTEIN 6.4   ALBUMIN 3.5   TOTBILIRUBIN 1.1   BILIRUBINCON 0.7*   AST 186*   ALT 128*   ALKPHOS 93      CoAgs Blood Gas:   No results found for this encounter No results found for this encounter    Cardiac Markers Lipid Panel   No results for input(s): "TROPONINI", "CKMB", "MBINDEX", "BNP" in the last 72 hours. No results found for this encounter   Urine Analysis Other Labs   No results found for this encounter No results found for this encounter    Invalid input(s): "PRL"     Microbiology:  Hospital Encounter on 10/10/23 (from the past 96 hour(s))   MRSA SCREEN, PCR, RAPID    Collection Time: 10/10/23 12:56 PM    Specimen: Nasal Swab   Culture Result Status    MRSA COLONIZATION SCREEN Positive (A) Final   LEGIONELLA URINE ANTIGEN    Collection Time: 10/10/23  4:55 PM    Specimen: Urine, Site not specified  Culture Result Status    LEGIONELLA ANTIGEN Negative Final   STREPTOCOCCUS PNEUMONIAE ANTIGEN,URINE    Collection Time: 10/10/23  4:55 PM    Specimen: Urine, Site not specified   Culture Result Status    S.PNEUMONIA ANTIGEN Negative Final   LEGIONELLA URINE ANTIGEN    Collection Time: 10/13/23  3:58 AM    Specimen: Urine, Site not specified   Culture Result Status    LEGIONELLA ANTIGEN Negative Final   STREPTOCOCCUS PNEUMONIAE ANTIGEN,URINE    Collection Time: 10/13/23  3:58 AM    Specimen: Urine, Site not specified   Culture Result Status    S.PNEUMONIA ANTIGEN Negative Final   CLOSTRIDIUM DIFFICILE TOXIN DETECTION    Collection Time: 10/13/23  5:13 PM    Specimen: Stool; Other   Culture Result Status    C. DIFFICILE INTERPRETATION Negative Final    C. DIFFICILE TOXIN GENE, PCR Negative Final    C. difficile toxin Not Performed Final    Narrative    No evidence of toxigenic C. difficile detected.    This test algorithm incorporates two FDA-cleared assays: (1) toxin gene PCR using Cepheid GeneXpert C. difficile assay, and (2) immunoassay for toxins A/B using [employer] Quik  Chek Complete.         Imaging Studies:  No new imaging

## 2023-10-14 NOTE — PT Treatment (Signed)
[facility] at [facility]   Acute Care Physical Therapy  Medical [address]  [place], [place] [address]  [phone]         Physical Therapy Acute Care Daily Treatment Record       Date: 10/14/2023  Time: 9:36  Patient's Name: [name] [name]  Date of Birth: Aug 22, 1966  Room Number: 570/A    Pertinent Information since last Physical Therapy visit :    No significant changes noted.      Precautions / Limitations :    Fall precautions     Weight Bearing status, if applicable :    No Weightbearing restrictions     Subjective :   Patient reports feeling better overall. 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 Uses a cane at home.     Pain Rating :   Pre-Treatment Pain: 0  Post-Treatment Pain: 0  Comments:                                  Orientation & Level of Orientation :    Alert, Cooperative, and Oriented Person, Place, and Time      6 Clicks Score :    1.) Turning from back to side while flat in bed without using a bed rail.   No assistance (4)  2.) Moving from lying on back to sitting on the side of a flat bed without using bed rails.  No assistance (4)  3.) Moving to and from a bed to a chair, including a wheelchair.   No assistance (4)  4.) Standing up for chair using your arms (eg, wheelchair or bedside chair).  No assistance (4)  5.) Walking in hospital room.  A little assistance (3)  6.) Climbing 3-5 steps with a railing.   A little assistance (3)    6 Clicks Raw Score: 22    6 Clicks Plan: Score 18-23 - PT consult is appropriate; may need home health at discharge      Treatment :      Transfers:  Rolling in bed:    Standby Assist  with assist of 1                                                Supine to sit:   Standby Assist   with assist of 1                                Sit to stand:  Standby Assist   with assist of 1                                                                Gait:     Distance:    100'              Device Utilized:   None          Level of Assist: Advertising account executive  with assist of 2      Weight-Bearing Status:   No Weightbearing restrictions                        Gait Deviations: Balance impaired            Other Treatment Interventions:       Daily Assessment of Status :    Patient transfers supine to sit with supervision and sit to stand with supervision. Patient ambulates 100' with CGAx2 and no assistive device. Unsteady during gait. No loss of balance. Fair gait speed. Patient performs toileting tasks independently and ambulates 10' with supervision and no assistive device. Recommended cane or walker--patient states he uses a cane at home. Ended session sitting in chair with all needs in reach.       Timed Charge Number of Minutes   Neuromuscular Re-Education     Gait Training  10   Therapeutic Activity     Therapeutic Exercise      Canalith Re-positioning      Other     Total Treatment Time  10     Patient Education :   Fall prevention strategies discussed with patient regarding gait and transfers         Interdisciplinary Communication :    Discussed patient care with Occupational Therapy             Discharge Planning :   Home with Home Health - Patient would benefit from Home Health services at discharge for improved functional strength, gait/balance training, improved overall functional mobility, improved quality of life, to decrease risk of falls, and promote functional independence     Plan :   Continue Physical Therapy program working on stated goals for continued functional improvement     Adrian Prows, PT, DPT  10/14/2023, 13:55

## 2023-10-14 NOTE — Progress Notes (Signed)
[facility]       HOSPITALIST PROGRESS NOTE      Date of Service:  10/14/2023  [name] [name], [name] [name], 57 y.o. male  Date of Admission:  10/10/2023  Date of Birth:  [birth year]  PCP: Cecilie Kicks, DO    Chief Complaint: pneumonia, sepsis     Subjective:  patient is stable. Back to baseline 2L NC prn. IVF stopped. On zosyn, zyvox and azithro. Denies any symptoms at this time. On and off withdrawal symptoms. ID following. US abdomen pending. No more diarrhea     Review of systems other than the above were negative     Current Facility-Administered Medications   Medication Dose Route Frequency    acetaminophen (TYLENOL) tablet  650 mg Oral Q6H PRN    allopurinol (ZYLOPRIM) tablet  100 mg Oral 2x/day    amLODIPine (NORVASC) tablet  10 mg Oral Daily    benzonatate (TESSALON) capsule  100 mg Oral Q8H    budesonide (PULMICORT RESPULES) 0.5 mg/2 mL nebulizer suspension  0.5 mg Nebulization 2x/day PRN    buPROPion (WELLBUTRIN XL) 24 hr extended release tablet  150 mg Oral Daily    diphenhydrAMINE (BENADRYL) capsule  25 mg Oral HS PRN    docusate sodium (COLACE) capsule  100 mg Oral 2x/day PRN    furosemide (LASIX) 10 mg/mL injection  20 mg Intravenous Daily    guaiFENesin (MUCINEX) extended release tablet - for cough (expectorant)  600 mg Oral 2x/day    heparin 5,000 unit/mL injection  5,000 Units Subcutaneous Q8HRS    hydrALAZINE (APRESOLINE) injection 5 mg  5 mg Intravenous Q6H PRN    ibuprofen (MOTRIN) tablet  800 mg Oral 4x/day PRN    ipratropium-albuterol 0.5 mg-3 mg(2.5 mg base)/3 mL Solution for Nebulization  3 mL Nebulization Q4H PRN    linezolid (ZYVOX) tablet  600 mg Oral 2x/day    loperamide (IMODIUM) capsule  2 mg Oral Q4H PRN    LORazepam (ATIVAN) tablet  1 mg Oral Q2H PRN    magnesium oxide (MAG-OX) 400mg  (241.3 mg elemental magnesium) tablet  400 mg Oral Daily    melatonin tablet  6 mg Oral NIGHTLY    mupirocin (BACTROBAN) 2% topical ointment   Apply Topically 2x/day    nicotine (NICODERM CQ) transdermal  patch (mg/24 hr)  21 mg Transdermal Daily    NS flush syringe  10 mL Intracatheter Q8HRS    NS flush syringe  10 mL Intracatheter Q1H PRN    ondansetron (ZOFRAN) 2 mg/mL injection  4 mg Intravenous Q6H PRN    PHENobarbital tablet  64.8 mg Oral 3x/day    [START ON 10/16/2023] PHENobarbital tablet  32.4 mg Oral 3x/day    piperacillin-tazobactam (ZOSYN) 4.5 g in iso-osmotic 100 mL premix IVPB  4.5 g Intravenous Q8H    thiamine-vitamin B1 tablet  100 mg Oral Daily        Allergies   Allergen Reactions    Naproxen Nausea/ Vomiting             Filed Vitals:    10/13/23 1936 10/14/23 0454 10/14/23 0455 10/14/23 0929   BP: 125/71  127/78 119/74   Pulse: (!) 107  77 87   Resp: 18      Temp: 36.6 C (97.9 F) 36.6 C (97.9 F)  36.4 C (97.5 F)   SpO2: 94%  93% 92%     O2 delivery: Nasal Cannula      Physical Exam:  General:  no acute distress, on 2L  prn baseline. pleasant   Skin: no lesions or rashes   HEENT: PERRLA, EOMI, NC/AT, no scleral icterus. Normal neck, no adenopathy, no thyromegaly, no mass, normal lips, no nasal discharge.   Respiratory:  normal breathing, wheezing and rhonchis b/l no respiratory distress improving. Diminished   Cardiovascular:  RRR, no murmur, no edema, pulses intact  Gastrointestinal: normal abdomen, normal active bowel sounds. No mass or tenderness.   Musculoskeletal: no cyanosis or edema.   Neurological: AAO x 3, normal strength and motor function  Genitourinary:  No incontinence or Foley catheter   Psych: no depression or anxiety        Laboratory Data:     Results for orders placed or performed during the hospital encounter of 10/10/23 (from the past 24 hour(s))   CLOSTRIDIUM DIFFICILE TOXIN DETECTION    Specimen: Stool; Other   Result Value Ref Range    C. DIFFICILE INTERPRETATION Negative Negative    C. DIFFICILE TOXIN GENE, PCR Negative Negative    C. difficile toxin Not Performed    CBC - AM ONCE   Result Value Ref Range    WBC 7.2 4.5 - 11.5 x10^3/uL    RBC 3.68 (L) 4.30 - 5.90  x10^6/uL    HGB 13.0 (L) 13.9 - 16.3 g/dL    HCT 87.5 64.3 - 32.9 %    MCV 104.8 (H) 80.0 - 100.0 fL    MCH 35.3 (H) 25.4 - 34.0 pg    MCHC 33.7 30.0 - 37.0 g/dL    RDW 51.8 (H) 84.1 - 14.0 %    PLATELETS 232 130 - 400 x10^3/uL    MPV 7.2 (L) 7.5 - 11.5 fL   BASIC METABOLIC PANEL - AM ONCE   Result Value Ref Range    SODIUM 137 137 - 145 mmol/L    POTASSIUM 3.7 3.5 - 5.1 mmol/L    CHLORIDE 99 98 - 107 mmol/L    CO2 TOTAL 33 (H) 22 - 30 mmol/L    ANION GAP 5 5 - 19 mmol/L    CALCIUM 8.6 8.4 - 10.2 mg/dL    GLUCOSE 660 74 - 630 mg/dL    BUN 21 (H) 9 - 20 mg/dL    CREATININE 1.60 1.09 - 1.20 mg/dL    BUN/CREA RATIO 20 6 - 20    ESTIMATED GFR >60 >60 mL/min/1.82m^2   HEPATIC FUNCTION PANEL   Result Value Ref Range    ALBUMIN 3.5 3.5 - 5.0 g/dL    ALKALINE PHOSPHATASE 93 38 - 126 U/L    ALT (SGPT) 128 (H) <50 U/L    AST (SGOT) 186 (H) 17 - 59 U/L    BILIRUBIN TOTAL 1.1 0.2 - 1.3 mg/dL    BILIRUBIN DIRECT 0.7 (H) 0.0 - 0.3 mg/dL    PROTEIN TOTAL 6.4 6.3 - 8.2 g/dL    ALBUMIN/GLOBULIN RATIO 1.2 (L) 1.5 - 2.5       Imaging Studies:    XR AP MOBILE CHEST   Final Result by Edi, Radresults In (12/07 1057)   Bilateral airspace opacities appear improved.            Radiologist location ID: [id number]         FLUORO ESOPHAGRAM,MODIFIED SWALLOW (SPEECH PERFORMED)   Final Result by Herminio Commons, RT (R) (12/06 4270)      CT ABDOMEN PELVIS WO IV CONTRAST   Final Result by Edi, Radresults In (12/06 0107)   Enlarged fatty infiltrated liver. Atelectasis in the lung bases.  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 Recommend outpatient MRI of the pancreas are better characterization.          One or more dose reduction techniques were used (e.g., Automated exposure control, adjustment of the mA and/or kV according to patient size, use of iterative reconstruction technique).         Radiologist location ID: [id number]         XR AP MOBILE CHEST   Final Result by Edi, Radresults In (12/05 1354)   Persistent  bilateral infiltrates/atelectasis similar to the prior exam            Radiologist location ID: [id number]         CT ANGIO CHEST FOR PULMONARY EMBOLUS W IV CONTRAST   Final Result by Edi, Radresults In (12/05 3474)   1. NO PULMONARY EMBOLUS   2. PATCHY OPACITIES IN THE LEFT UPPER LOBE LIKELY DUE TO PNEUMONIA   3. 1.8 CM OVAL-SHAPED NODULAR DENSITY LEFT UPPER LOBE COULD BE DUE TO PNEUMONIA BUT WARRANTS SHORT-TERM FOLLOW-UP CT SCANNING IN 3 MONTHS TO CONFIRM RESOLUTION AND EXCLUDE A PULMONARY NODULE   4. 9 MM RIGHT UPPER LOBE LUNG NODULE IS NEW COMPARED TO 02/15/2023. RECOMMEND SHORT-TERM FOLLOW-UP CT SCANNING IN 3 MONTHS   5. SUBTLE PERIBRONCHIAL OPACITIES IN THE RIGHT UPPER LOBE COULD BE INFECTIOUS OR INFLAMMATORY IN ETIOLOGY   6. RIGHT MIDDLE LOBE AND RIGHT LOWER LOBE ATELECTASIS         One or more dose reduction techniques were used (e.g., Automated exposure control, adjustment of the mA and/or kV according to patient size, use of iterative reconstruction technique).         Radiologist location ID: [id number]         XR CHEST PA AND LATERAL   Final Result by Edi, Radresults In (12/05 0201)   NO ACUTE FINDINGS.         Radiologist location ID: [id number]             Assessment/Plan:        Active Hospital Problems   (*Primary Problem)     Diagnosis    *Pneumonia    Sepsis (CMS HCC)    Acute hypoxic respiratory failure (CMS HCC)    Vapes nicotine containing substance    History of substance use    Chronic obstructive pulmonary disease (CMS HCC)      Severe sepsis 2/2 acute bacterial PNA from suspected gram +/-/atypical organisms  Lactic acidosis -improved  Meets sepsis criteria given tachycardia, tachypnea, and pulmonary source; severe w/ elevated lactate  COVID and flu ruled out  Continue tx with Zithromax and zosyn now. Stopped unasyn  Received 30 cc/kilogram IV fluid bolus, IVF stopped  Blood cultures and sputum cultures ngtd. pending urine legionella, urine strep, and mrsa nares + started bactroban and  elevated procalcitonin  CTA chest: IMPRESSION: 1.NO PULMONARY EMBOLUS. 2.PATCHY OPACITIES IN THE LEFT UPPER LOBE LIKELY DUE TO PNEUMONIA. 3.1.8 CM OVAL-SHAPED NODULAR DENSITY LEFT UPPER LOBE COULD BE DUE TO PNEUMONIA BUT WARRANTS SHORT-TERM FOLLOW-UP CT SCANNING IN 3 MONTHS TO CONFIRM RESOLUTION AND EXCLUDE A PULMONARY NODULE. 4.9 MM RIGHT UPPER LOBE LUNG NODULE IS NEW COMPARED TO 02/15/2023. RECOMMEND SHORT-TERM FOLLOW-UP CT SCANNING IN 3 MONTHS. 5.SUBTLE PERIBRONCHIAL OPACITIES IN THE RIGHT UPPER LOBE COULD BE INFECTIOUS OR INFLAMMATORY IN ETIOLOGY. 6.RIGHT MIDDLE LOBE AND RIGHT LOWER LOBE ATELECTASIS  ID consulted added po zyvox  -speech following. MBS passed but with aspiration precautions. No signs currently  CT ab/pelvis: IMPRESSION: Enlarged fatty infiltrated liver. Atelectasis in the lung bases. 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 Recommend outpatient MRI of the pancreas are better characterization.  GI consulted and NTD f/u outpt       Acute on chronic COPD exacerbation secondary to bacterial pneumonia  Duoneb treatments q.4 hours   Solu-Medrol 40 mg IV push daily x5 days -finished  Azithromycin 500 IV daily x5 days - finished   Supplemental O2 PRN   Tessalon Perles 100 mg q.8 hours and Mucinex      Acute on chronic hypoxic respiratory failure from above  CTA negative for PE  Continue supplemental O2 therapy and attempt to wean as tolerated. On 2L NC prn at home on 6 here now back to baseline  Duonoebs, steroids     Hypomagnesemia -resolved   Received 2 gm IV mag on admission     Macrocytic anemia  Likely r/t alcohol   B12 and folate wnl     Essential HTN uncontrolled now improved   Not on meds started Norvasc increased to 10 mg daily  IV hydralazine PRN     Lung nodule  Noted on CT  Outpatient surveillance recommended per PCP or pulmonary     Transaminitis   Chronic, stable, secondary to alcohol use and fatty liver  US abdomen pending. Ammonia WNL  Trending up will monitor      Alcohol use disorder with withdrawal concerns  Last alcoholic drink 12/5  Alcohol cessation advised  CIWA protocol in place  Prn and scheduled ativan to now phenobarbital     Opioid use disorder  Gets Suboxone off the street, not currently prescribed it     Tobacco use disorder  Smoking cessation discussed   Nicotine patch provided     Carpal tunnel syndrome  Post surgery with Dr. 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 Generalized weakness/physical debility  Pt/ot/ss following. CM consulted    20. B/l leg/feet edema  Suspected due to IVF now stopped  Echo 3/24 preserved EF  Lasix 20 mg daily started and 40 mg IV once  Venous duplex neg  No need for cardio    21. Diarrhea - improved  c. Diff neg and prn imodium     Remaining medical comorbidities are stable and home meds have been resumed accordingly.     Code status: FULL CODE: ATTEMPT RESUSCITATION/CPR  DVT prophylaxis: Heparin SQ        Dispo: monitor labs and vitals. Gen sug and ID consulted. Monitor breathing. Weaned O2 to baseline 2L prn. Follow cultures. Monitor for signs of withdrawal. US abdomen pending. Final PO abx recs pending      Florestine Avers, MD

## 2023-10-14 NOTE — Progress Notes (Signed)
GASTROENTEROLOGY FOLLOW-UP NOTE          [name] [name], [name] [name]  Date of Admission:  10/10/2023  Date of Birth:  September 18, 1966  Date of Service:  10/14/2023      Subjective:  The patient was seen at the bedside.  He is doing well.  No abdominal pain, nausea, vomiting.  He has had no jaundice or encephalopathy.  Transaminases have increased bilirubin remains normal.    Vital Signs:  Temp (24hrs) Max:36.6 C (97.9 F)      Temperature: 36.4 C (97.5 F)  BP (Non-Invasive): 119/74  MAP (Non-Invasive): 89 mmHG  Heart Rate: 87  Respiratory Rate: 18  SpO2: 92 %    Current Medications:  acetaminophen (TYLENOL) tablet, 650 mg, Oral, Q6H PRN  allopurinol (ZYLOPRIM) tablet, 100 mg, Oral, 2x/day  amLODIPine (NORVASC) tablet, 10 mg, Oral, Daily  benzonatate (TESSALON) capsule, 100 mg, Oral, Q8H  budesonide (PULMICORT RESPULES) 0.5 mg/2 mL nebulizer suspension, 0.5 mg, Nebulization, 2x/day PRN  buPROPion (WELLBUTRIN XL) 24 hr extended release tablet, 150 mg, Oral, Daily  diphenhydrAMINE (BENADRYL) capsule, 25 mg, Oral, HS PRN  docusate sodium (COLACE) capsule, 100 mg, Oral, 2x/day PRN  furosemide (LASIX) 10 mg/mL injection, 20 mg, Intravenous, Daily  guaiFENesin (MUCINEX) extended release tablet - for cough (expectorant), 600 mg, Oral, 2x/day  heparin 5,000 unit/mL injection, 5,000 Units, Subcutaneous, Q8HRS  hydrALAZINE (APRESOLINE) injection 5 mg, 5 mg, Intravenous, Q6H PRN  ibuprofen (MOTRIN) tablet, 800 mg, Oral, 4x/day PRN  ipratropium-albuterol 0.5 mg-3 mg(2.5 mg base)/3 mL Solution for Nebulization, 3 mL, Nebulization, Q4H PRN  linezolid (ZYVOX) tablet, 600 mg, Oral, 2x/day  loperamide (IMODIUM) capsule, 2 mg, Oral, Q4H PRN  LORazepam (ATIVAN) tablet, 1 mg, Oral, Q2H PRN  magnesium oxide (MAG-OX) 400mg  (241.3 mg elemental magnesium) tablet, 400 mg, Oral, Daily  melatonin tablet, 6 mg, Oral, NIGHTLY  mupirocin (BACTROBAN) 2% topical ointment, , Apply Topically, 2x/day  nicotine (NICODERM CQ) transdermal patch (mg/24 hr), 21 mg,  Transdermal, Daily  NS flush syringe, 10 mL, Intracatheter, Q8HRS  NS flush syringe, 10 mL, Intracatheter, Q1H PRN  ondansetron (ZOFRAN) 2 mg/mL injection, 4 mg, Intravenous, Q6H PRN  PHENobarbital tablet, 64.8 mg, Oral, 3x/day  [START ON 10/16/2023] PHENobarbital tablet, 32.4 mg, Oral, 3x/day  piperacillin-tazobactam (ZOSYN) 4.5 g in iso-osmotic 100 mL premix IVPB, 4.5 g, Intravenous, Q8H  thiamine-vitamin B1 tablet, 100 mg, Oral, Daily        Today's Physical Exam:    Abdominal exam is benign.  No scleral icterus.  No encephalopathy.      Labs  Please indicate ordered or reviewed)  Reviewed: Lab Results for Last 24 Hours:    Results for orders placed or performed during the hospital encounter of 10/10/23 (from the past 24 hour(s))   CLOSTRIDIUM DIFFICILE TOXIN DETECTION    Specimen: Stool; Other   Result Value Ref Range    C. DIFFICILE INTERPRETATION Negative Negative    C. DIFFICILE TOXIN GENE, PCR Negative Negative    C. difficile toxin Not Performed    CBC - AM ONCE   Result Value Ref Range    WBC 7.2 4.5 - 11.5 x10^3/uL    RBC 3.68 (L) 4.30 - 5.90 x10^6/uL    HGB 13.0 (L) 13.9 - 16.3 g/dL    HCT 91.4 78.2 - 95.6 %    MCV 104.8 (H) 80.0 - 100.0 fL    MCH 35.3 (H) 25.4 - 34.0 pg    MCHC 33.7 30.0 - 37.0 g/dL    RDW 21.3 (  H) 11.5 - 14.0 %    PLATELETS 232 130 - 400 x10^3/uL    MPV 7.2 (L) 7.5 - 11.5 fL   BASIC METABOLIC PANEL - AM ONCE   Result Value Ref Range    SODIUM 137 137 - 145 mmol/L    POTASSIUM 3.7 3.5 - 5.1 mmol/L    CHLORIDE 99 98 - 107 mmol/L    CO2 TOTAL 33 (H) 22 - 30 mmol/L    ANION GAP 5 5 - 19 mmol/L    CALCIUM 8.6 8.4 - 10.2 mg/dL    GLUCOSE 409 74 - 811 mg/dL    BUN 21 (H) 9 - 20 mg/dL    CREATININE 9.14 7.82 - 1.20 mg/dL    BUN/CREA RATIO 20 6 - 20    ESTIMATED GFR >60 >60 mL/min/1.30m^2   HEPATIC FUNCTION PANEL   Result Value Ref Range    ALBUMIN 3.5 3.5 - 5.0 g/dL    ALKALINE PHOSPHATASE 93 38 - 126 U/L    ALT (SGPT) 128 (H) <50 U/L    AST (SGOT) 186 (H) 17 - 59 U/L    BILIRUBIN TOTAL 1.1  0.2 - 1.3 mg/dL    BILIRUBIN DIRECT 0.7 (H) 0.0 - 0.3 mg/dL    PROTEIN TOTAL 6.4 6.3 - 8.2 g/dL    ALBUMIN/GLOBULIN RATIO 1.2 (L) 1.5 - 2.5   ECG 12 LEAD   Result Value Ref Range    Ventricular rate 89 BPM    Atrial Rate 89 BPM    PR Interval 156 ms    QRS Duration 90 ms    QT Interval 392 ms    QTC Calculation 476 ms    Calculated P Axis 68 degrees    Calculated R Axis 42 degrees    Calculated T Axis 58 degrees   HEPATIC FUNCTION PANEL   Result Value Ref Range    ALBUMIN 4.0 3.5 - 5.0 g/dL    ALKALINE PHOSPHATASE 94 38 - 126 U/L    ALT (SGPT) 178 (H) <50 U/L    AST (SGOT) 318 (H) 17 - 59 U/L    BILIRUBIN TOTAL 1.3 0.2 - 1.3 mg/dL    BILIRUBIN DIRECT 0.8 (H) 0.0 - 0.3 mg/dL    PROTEIN TOTAL 7.4 6.3 - 8.2 g/dL    ALBUMIN/GLOBULIN RATIO 1.2 (L) 1.5 - 2.5             ASSESSMENT :  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  No signs liver failure.      PLAN :  The patient will need outpatient follow-up.  We will need to follow LFTs as an outpatient.  Will also need an acute hepatitis panel which can be done as an outpatient.  Also will need outpatient MRI to follow-up the pancreas cyst.  No objections to hospital discharge from my standpoint.      Bashar Milam T. Mry Lamia, M.D.  Gastroenterology

## 2023-10-14 NOTE — Discharge Summary (Addendum)
[facility]  DISCHARGE SUMMARY      PATIENT NAME:  [name] [name], [name] [name]  MRN:  [id number]  DOB:  [birth year]    INPATIENT ADMISSION DATE: 10/10/2023  DISCHARGE DATE:  10/14/2023    ATTENDING PHYSICIAN: Florestine Avers, MD  SERVICE: [facility] HOSPITALIST  PRIMARY CARE PHYSICIAN: Cecilie Kicks, DO       Reason for Admission       Diagnosis    Pneumonia [127785]            DISCHARGE DIAGNOSIS: pna improved, alcohol withdrawal improved     Principal Problem:  Pneumonia    Active Hospital Problems    Diagnosis Date Noted    Principal Problem: Pneumonia [J18.9] 10/10/2023    Sepsis (CMS HCC) [A41.9] 10/10/2023    Acute hypoxic respiratory failure (CMS HCC) [J96.01] 10/10/2023    SOB (shortness of breath) [R06.02] 10/10/2023    Vapes nicotine containing substance [Z72.0] 04/29/2023    History of substance use [Z87.898] 04/29/2023    Chronic obstructive pulmonary disease (CMS HCC) [J44.9] 04/29/2023      Resolved Hospital Problems   No resolved problems to display.     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  No fevers or chest pain.     Tachycardic on arrival and hypoxic on RA. Lactate 4.1. CXR negative. CTA negative for PE but with evidence of PNA.  On exam he feels like he is withdrawing from alcohol.  He drank shortly before he came in.  He has sutures in his left wrist from recent carpal tunnel surgery that were to be removed on Monday but he missed his appointment.     Hospital Course:     Assessment/Plan:            Active Hospital Problems   (*Primary Problem)     Diagnosis    *Pneumonia    Sepsis (CMS HCC)    Acute hypoxic respiratory failure (CMS HCC)    Vapes nicotine containing substance    History of substance use    Chronic obstructive pulmonary disease (CMS HCC)      Severe sepsis 2/2 acute bacterial PNA from suspected gram +/-/atypical organisms  Lactic acidosis -improved  Meets sepsis criteria given tachycardia, tachypnea, and pulmonary source; severe w/ elevated lactate  COVID and flu ruled out  Continue tx with Zithromax and zosyn now. Stopped unasyn, added zyvox, home with Levaquin and doxcy 5 days  Received 30 cc/kilogram IV fluid bolus, IVF stopped  Blood cultures and sputum cultures ngtd. pending urine legionella, urine strep, and mrsa nares + started bactroban and elevated procalcitonin  CTA chest: IMPRESSION: 1.NO PULMONARY EMBOLUS. 2.PATCHY OPACITIES IN THE LEFT UPPER LOBE LIKELY DUE TO PNEUMONIA. 3.1.8 CM OVAL-SHAPED NODULAR DENSITY LEFT UPPER LOBE COULD BE DUE TO PNEUMONIA BUT WARRANTS SHORT-TERM FOLLOW-UP CT SCANNING IN 3 MONTHS TO CONFIRM RESOLUTION AND EXCLUDE A PULMONARY NODULE. 4.9 MM  RIGHT UPPER LOBE LUNG NODULE IS NEW COMPARED TO 02/15/2023. RECOMMEND SHORT-TERM FOLLOW-UP CT SCANNING IN 3 MONTHS. 5.SUBTLE PERIBRONCHIAL OPACITIES IN THE RIGHT UPPER LOBE COULD BE INFECTIOUS OR INFLAMMATORY IN ETIOLOGY. 6.RIGHT MIDDLE LOBE AND RIGHT LOWER LOBE ATELECTASIS  ID consulted cleared for dc  -speech followed. MBS passed but with aspiration precautions. No signs currently  CT ab/pelvis: IMPRESSION: Enlarged fatty infiltrated liver. Atelectasis in the lung bases. 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 Recommend outpatient MRI of the pancreas are better characterization.  GI consulted and NTD f/u outpt        Acute on chronic COPD exacerbation secondary to bacterial pneumonia - improved   Duoneb treatments q.4 hours   Solu-Medrol 40 mg IV push daily x5 days -finished  Azithromycin 500  IV daily x5 days - finished   Supplemental O2 PRN   Tessalon Perles 100 mg q.8 hours and Mucinex      Acute on chronic hypoxic respiratory failure from above  CTA negative for PE  Continue supplemental O2 therapy and attempt to wean as tolerated. On 2L NC prn at home on 6 here now back to baseline  Duonoebs, steroids     Hypomagnesemia -resolved   Received 2 gm IV mag on admission  Added mag oxide     Macrocytic anemia  Likely r/t alcohol   B12 and folate wnl     Essential HTN uncontrolled now improved   Not on meds started Norvasc increased to 10 mg daily  IV hydralazine PRN     Lung nodule  Noted on CT  Outpatient surveillance recommended per PCP or pulmonary     Transaminitis   Chronic, stable, secondary to alcohol use and fatty liver vs slight alcoholic hepatitis  US abdomen wnl. Ammonia WNL  Trending up will monitor, likely from phenobarbital  Okay to DC per GI/ f/u labs 12/13 as well as hepatitis panel     Alcohol use disorder with withdrawal concerns  Last alcoholic drink 12/5  Alcohol cessation advised  CIWA protocol in place  Prn and scheduled ativan to now phenobarbital  Thiamine/folic aid     Opioid use disorder  Gets Suboxone off the street, not currently prescribed it     Tobacco use disorder  Smoking cessation discussed   Nicotine patch provided     Carpal tunnel syndrome  Post surgery with Dr. 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 Generalized weakness/physical debility  Pt/ot/ss following. CM consulted     20. B/l leg/feet edema = improved    Suspected due to IVF now stopped  Echo 3/24 preserved EF  Lasix 20 mg daily started and 40 mg IV once - stopped on dc  Venous duplex neg  No need for cardio     21. Diarrhea - improved  c. Diff neg and prn imodium     Remaining medical comorbidities are stable and home meds have been resumed accordingly.  Code status: FULL CODE: ATTEMPT RESUSCITATION/CPR  DVT prophylaxis: Heparin SQ      Vital Signs stable on discharge.     Physical Exam:  General:  no acute distress, on 2L prn baseline. pleasant   Skin: no lesions or rashes   HEENT: PERRLA, EOMI, NC/AT, no scleral icterus. Normal neck, no adenopathy, no thyromegaly, no mass, normal lips, no nasal discharge.   Respiratory:  normal breathing, wheezing and rhonchis b/l no respiratory distress improving. Diminished   Cardiovascular:  RRR, no murmur, no edema, pulses intact  Gastrointestinal: normal abdomen, normal active bowel sounds. No mass or tenderness.   Musculoskeletal: no cyanosis or edema.   Neurological: AAO x 3, normal strength and motor function  Genitourinary:  No incontinence or Foley catheter   Psych: no depression or anxiety     Charts, labs and imaging reviewed on discharge.       >35 minutes spent with the patient on discharge day        DISCHARGE MEDICATIONS:     Current Discharge Medication List        START taking these medications.        Details   amLODIPine 10 mg Tablet  Commonly known as: NORVASC  Start taking on: October 15, 2023   10 mg, Oral, DAILY  Qty: 30 Tablet  Refills: 0     benzonatate 100 mg Capsule  Commonly known as: TESSALON   100 mg, Oral, EVERY 8 HOURS PRN  Qty: 30 Capsule  Refills: 0     docusate sodium 100 mg Capsule  Commonly known as: COLACE   100 mg, Oral, 2 TIMES DAILY PRN  Refills: 0     doxycycline hyclate 100 mg Capsule  Commonly known as: VIBRAMYCIN   100 mg, Oral, 2 TIMES DAILY  Qty: 10 Capsule  Refills: 0     folic acid 1 mg Tablet  Commonly known as: FOLVITE   1 mg, Oral, DAILY  Qty: 30 Tablet  Refills: 0      guaiFENesin 600 mg Tablet Extended Release 12hr  Commonly known as: MUCINEX   600 mg, Oral, 2 TIMES DAILY PRN  Qty: 30 Tablet  Refills: 0     ipratropium-albuteroL 0.5 mg-3 mg(2.5 mg base)/3 mL nebulizer solution  Commonly known as: DUONEB   3 mL, Nebulization, EVERY 4 HOURS PRN  Qty: 1 Each  Refills: 0     levoFLOXacin 750 mg Tablet  Commonly known as: LEVAQUIN   750 mg, Oral, DAILY  Qty: 5 Tablet  Refills: 0     loperamide 2 mg Capsule  Commonly known as: IMODIUM   2 mg, Oral, EVERY 4 HOURS PRN  Refills: 0     magnesium oxide 400 mg Tablet  Commonly known as: MAG-OX  Start taking on: October 15, 2023   400 mg, Oral, DAILY  Refills: 0     nicotine 21 mg/24 hr Patch 24 hr  Commonly known as: NICODERM CQ  Start taking on: October 15, 2023   21 mg, Transdermal, DAILY  Qty: 30 Patch  Refills: 0     thiamine mononitrate 100 mg Tablet  Start taking on: October 15, 2023   100 mg, Oral, DAILY  Qty: 30 Tablet  Refills: 0            CONTINUE these medications - NO CHANGES were made during your visit.        Details   acetaminophen 325 mg Tablet  Commonly known as: TYLENOL   650  mg, Oral, EVERY 6 HOURS  Refills: 0     allopurinoL 100 mg Tablet  Commonly known as: ZYLOPRIM   100 mg, Oral, 2 TIMES DAILY  Qty: 180 Tablet  Refills: 1     buPROPion 150 mg Tablet Extended Release 24 hr  Commonly known as: WELLBUTRIN XL   150 mg, Oral, DAILY  Qty: 30 Tablet  Refills: 3     Ibuprofen 200 mg Tablet  Commonly known as: MOTRIN   800 mg, Oral, 4 TIMES DAILY PRN, Last dose 11/8  Refills: 0     SUBOXONE SL   2 mg, Sublingual, DAILY, ASKED PATIENT WHAT THE MG WAS ON THIS MEDICATION AND He STATES THAT He DOES NOT HAVE A PRESCRIPTION FOR IT He BUYS IT OFF THE "[employer]" It's an 8mg  strip that he cuts into 8 pieces , uses 1-2 pieces at a time  Refills: 0            STOP taking these medications.      albuterol 2.5 mg/0.5 mL Solution for Nebulization  Commonly known as: PROVENTIL              Filed Vitals:    10/13/23 1936 10/14/23  0454 10/14/23 0455 10/14/23 0929   BP: 125/71  127/78 119/74   Pulse: (!) 107  77 87   Resp: 18   18   Temp: 36.6 C (97.9 F) 36.6 C (97.9 F)  36.4 C (97.5 F)   SpO2: 94%  93% 92%          Laboratory Data:     Results for orders placed or performed during the hospital encounter of 10/10/23 (from the past 24 hour(s))   CLOSTRIDIUM DIFFICILE TOXIN DETECTION    Specimen: Stool; Other   Result Value Ref Range    C. DIFFICILE INTERPRETATION Negative Negative    C. DIFFICILE TOXIN GENE, PCR Negative Negative    C. difficile toxin Not Performed    CBC - AM ONCE   Result Value Ref Range    WBC 7.2 4.5 - 11.5 x10^3/uL    RBC 3.68 (L) 4.30 - 5.90 x10^6/uL    HGB 13.0 (L) 13.9 - 16.3 g/dL    HCT 09.8 11.9 - 14.7 %    MCV 104.8 (H) 80.0 - 100.0 fL    MCH 35.3 (H) 25.4 - 34.0 pg    MCHC 33.7 30.0 - 37.0 g/dL    RDW 82.9 (H) 56.2 - 14.0 %    PLATELETS 232 130 - 400 x10^3/uL    MPV 7.2 (L) 7.5 - 11.5 fL   BASIC METABOLIC PANEL - AM ONCE   Result Value Ref Range    SODIUM 137 137 - 145 mmol/L    POTASSIUM 3.7 3.5 - 5.1 mmol/L    CHLORIDE 99 98 - 107 mmol/L    CO2 TOTAL 33 (H) 22 - 30 mmol/L    ANION GAP 5 5 - 19 mmol/L    CALCIUM 8.6 8.4 - 10.2 mg/dL    GLUCOSE 130 74 - 865 mg/dL    BUN 21 (H) 9 - 20 mg/dL    CREATININE 7.84 6.96 - 1.20 mg/dL    BUN/CREA RATIO 20 6 - 20    ESTIMATED GFR >60 >60 mL/min/1.60m^2   HEPATIC FUNCTION PANEL   Result Value Ref Range    ALBUMIN 3.5 3.5 - 5.0 g/dL    ALKALINE PHOSPHATASE 93 38 - 126 U/L    ALT (SGPT) 128 (H) <50 U/L  AST (SGOT) 186 (H) 17 - 59 U/L    BILIRUBIN TOTAL 1.1 0.2 - 1.3 mg/dL    BILIRUBIN DIRECT 0.7 (H) 0.0 - 0.3 mg/dL    PROTEIN TOTAL 6.4 6.3 - 8.2 g/dL    ALBUMIN/GLOBULIN RATIO 1.2 (L) 1.5 - 2.5   ECG 12 LEAD   Result Value Ref Range    Ventricular rate 89 BPM    Atrial Rate 89 BPM    PR Interval 156 ms    QRS Duration 90 ms    QT Interval 392 ms    QTC Calculation 476 ms    Calculated P Axis 68 degrees    Calculated R Axis 42 degrees    Calculated T Axis 58 degrees    HEPATIC FUNCTION PANEL   Result Value Ref Range    ALBUMIN 4.0 3.5 - 5.0 g/dL    ALKALINE PHOSPHATASE 94 38 - 126 U/L    ALT (SGPT) 178 (H) <50 U/L    AST (SGOT) 318 (H) 17 - 59 U/L    BILIRUBIN TOTAL 1.3 0.2 - 1.3 mg/dL    BILIRUBIN DIRECT 0.8 (H) 0.0 - 0.3 mg/dL    PROTEIN TOTAL 7.4 6.3 - 8.2 g/dL    ALBUMIN/GLOBULIN RATIO 1.2 (L) 1.5 - 2.5       Imaging Studies:    US ABDOMEN (FOR ASCITES)   Final Result by Edi, Radresults In (12/09 1439)   NO ASCITES            Radiologist location ID: [id number]         XR AP MOBILE CHEST   Final Result by Edi, Radresults In (12/07 1057)   Bilateral airspace opacities appear improved.            Radiologist location ID: [id number]         FLUORO ESOPHAGRAM,MODIFIED SWALLOW (SPEECH PERFORMED)   Final Result by Herminio Commons, RT (R) (12/06 2130)      CT ABDOMEN PELVIS WO IV CONTRAST   Final Result by Edi, Radresults In (12/06 0107)   Enlarged fatty infiltrated liver. Atelectasis in the lung bases.      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 Recommend outpatient MRI of the pancreas are better characterization.          One or more dose reduction techniques were used (e.g., Automated exposure control, adjustment of the mA and/or kV according to patient size, use of iterative reconstruction technique).         Radiologist location ID: [id number]         XR AP MOBILE CHEST   Final Result by Edi, Radresults In (12/05 1354)   Persistent bilateral infiltrates/atelectasis similar to the prior exam            Radiologist location ID: [id number]         CT ANGIO CHEST FOR PULMONARY EMBOLUS W IV CONTRAST   Final Result by Edi, Radresults In (12/05 2841)   1. NO PULMONARY EMBOLUS   2. PATCHY OPACITIES IN THE LEFT UPPER LOBE LIKELY DUE TO PNEUMONIA   3. 1.8 CM OVAL-SHAPED NODULAR DENSITY LEFT UPPER LOBE COULD BE DUE TO PNEUMONIA BUT WARRANTS SHORT-TERM FOLLOW-UP CT SCANNING IN 3 MONTHS TO CONFIRM RESOLUTION AND EXCLUDE A PULMONARY NODULE   4. 9 MM RIGHT UPPER  LOBE LUNG NODULE IS NEW COMPARED TO 02/15/2023. RECOMMEND SHORT-TERM FOLLOW-UP CT SCANNING IN 3 MONTHS   5. SUBTLE PERIBRONCHIAL OPACITIES IN THE RIGHT UPPER LOBE COULD BE INFECTIOUS  OR INFLAMMATORY IN ETIOLOGY   6. RIGHT MIDDLE LOBE AND RIGHT LOWER LOBE ATELECTASIS         One or more dose reduction techniques were used (e.g., Automated exposure control, adjustment of the mA and/or kV according to patient size, use of iterative reconstruction technique).         Radiologist location ID: [id number]         XR CHEST PA AND LATERAL   Final Result by Edi, Radresults In (12/05 0201)   NO ACUTE FINDINGS.         Radiologist location ID: [id number]               DISCHARGE INSTRUCTIONS:   Follow-up Information       Midcap, Riley Lam, DO Follow up in 1 week(s).    Specialty: FAMILY MEDICINE  Contact information:  [address]  [place] [address]  [phone]               Kathaleen Grinder, MD Follow up in 1 week(s).    Specialty: INFECTIOUS DISEASE  Contact information:  [address]  [place] [address]  [phone]               Voellinger, Loraine Leriche, MD Follow up in 1 week(s).    Specialty: INTERNAL MEDICINE  Contact information:  [address]  [place] [address]  [phone]                              CBC     Release to patient Automated [100001]      COMPREHENSIVE METABOLIC PANEL, NON-FASTING     Release to patient Automated [100001]      MAGNESIUM     Release to patient Automated [100001]      DISCHARGE INSTRUCTION - RESUME HOME DIET     Diet: RESUME HOME DIET      DISCHARGE INSTRUCTION - ACTIVITY     Activity: AS TOLERATED      DISCHARGE INSTRUCTION - MISC    F/u PCP, GI< ID, finish abx. Labs on 12/13. Stop drinking alcohol.            Advance Directive Information      Flowsheet Row Most Recent Value   Does the Patient have an Advance Directive? No, Information Offered and Refused            CONDITION ON DISCHARGE: stable     DISCHARGE DISPOSITION:  Home discharge     Florestine Avers, MD    Copies sent to Care Team         Relationship Specialty Notifications Start End    Cecilie Kicks, [place] PCP - General FAMILY MEDICINE Admissions 06/12/22     Phone: [phone] Fax: [phone]         [address] [place] [address]    Mariann Laster, MD  NEUROLOGICAL SURGERY Admissions 06/21/23     Phone: [phone] Fax: [phone]         [address] [facility] [place] [address]    Clarisa Fling, APRN,FNP-BC  NURSE PRACTITIONER Admissions 06/21/23     Phone: [phone] Fax: [phone]         [address] [facility] [address]    Tonye Royalty, FNP-C  NURSE PRACTITIONER Admissions 06/21/23     new to patient, has not seen yet    Phone: [phone] Fax:  [phone]         [address] [place] [address]              Referring providers can utilize [email or web address] to access their referred [facility] patient's information.

## 2023-10-14 NOTE — Care Plan (Signed)
Problem: Adult Inpatient Plan of Care  Goal: Plan of Care Review  Outcome: Ongoing (see interventions/notes)  Goal: Patient-Specific Goal (Individualized)  Outcome: Ongoing (see interventions/notes)  Goal: Absence of Hospital-Acquired Illness or Injury  Outcome: Ongoing (see interventions/notes)  Goal: Optimal Comfort and Wellbeing  Outcome: Ongoing (see interventions/notes)  Goal: Rounds/Family Conference  Outcome: Ongoing (see interventions/notes)     Problem: Fall Injury Risk  Goal: Absence of Fall and Fall-Related Injury  Outcome: Ongoing (see interventions/notes)     Problem: Alcohol Withdrawal  Goal: Alcohol Withdrawal Symptom Control  Outcome: Ongoing (see interventions/notes)  Goal: Optimal Neurologic Function  Outcome: Ongoing (see interventions/notes)  Goal: Readiness for Change Identified  Outcome: Ongoing (see interventions/notes)     Problem: Infection  Goal: Absence of Infection Signs and Symptoms  Outcome: Ongoing (see interventions/notes)     Problem: Pneumonia  Goal: Fluid Balance  Outcome: Ongoing (see interventions/notes)  Goal: Absence of Infection Signs and Symptoms  Outcome: Ongoing (see interventions/notes)  Goal: Effective Oxygenation and Ventilation  Outcome: Ongoing (see interventions/notes)     Problem: Skin Injury Risk Increased  Goal: Skin Health and Integrity  Outcome: Ongoing (see interventions/notes)

## 2023-10-14 NOTE — Care Management Notes (Signed)
SS consult received. Patient is alert and oriented times three and lives at home alone. Patient reports that he was independent prior to admission and uses a cane and has home oxygen. Patient denies any current SS needs at this time. SS will continue to follow and assist.    Earlie Server. Alain Honey  Social Services

## 2023-10-14 NOTE — OT Treatment (Signed)
[facility]  Rehabilitation Services  Occupational Therapy Progress Note    Patient Name: [name] [name]  Date of Birth: 1966/09/04  Height:  188 cm (6\' 2" )  Weight:  (!) 139 kg (307 lb)  Room/Bed: 570/A  Payor: [employer]ICA[employer] / Plan: [employer] MEDICAID / Product Type: Medicaid [facility] /     Assessment:       10/14/23 0936   Therapist Pager   OT Assigned/ Pager # Donald Pore MOT, OTR/L  [phone]   Rehab Session   Document Type therapy progress note (daily note)   Total OT Minutes: 10   Daily Activity AM-PAC/6-clicks Score   Putting on/Taking off clothing on lower body 3   Bathing 3   Toileting 3   Putting on/Taking off clothing on upper body 3   Personal grooming 4   Eating Meals 4   Raw Score Total 20   Standardized (t-scale) Score 42.03   CMS 0-100% Score 38.32   CMS Modifier CJ         Discharge Needs:   Equipment Recommendation:    Discharge Disposition: home    Plan:   Continue to follow patient according to established plan of care.  The risks/benefits of therapy have been discussed with the patient/caregiver and he/she is in agreement with the established plan of care.     Subjective & Objective:     Pt. Supine in bed and willing to participate. Pt. Completed bed mobility supine to EOB with CGA, sit to stand, transfers and functional mobility with CGA, LE dressing-sup, toileting-sup.    Pain: 0/10    Patient education OT role    Therapist:   Donald Pore, OT     Start Time: 936  End Time: 946  Total Treatment Time: 10 minutes  Charges Entered: TA  Department Number: [id number]

## 2023-10-15 LAB — DRUG MONITOR, BENZO, QN, URINE
ALPHAHYDROXYALPRAZOLAM: NEGATIVE ng/mL (ref <25)
ALPHAHYDROXYMIDAZOLAM: NEGATIVE ng/mL (ref <50)
ALPHAHYDROXYTRIAZOLAM: NEGATIVE ng/mL (ref <50)
AMINOCLONAZEPAM: NEGATIVE ng/mL (ref <25)
HYDROXYETHYLFLURAZEPAM: NEGATIVE ng/mL (ref <50)
LORAZEPAM: 4700 ng/mL — ABNORMAL HIGH (ref <50)
NORDIAZEPAM: 75 ng/mL — ABNORMAL HIGH (ref <50)
OXAZEPAM: NEGATIVE ng/mL (ref <50)
TEMAZEPAM: 120 ng/mL — ABNORMAL HIGH (ref <50)

## 2023-10-15 LAB — SUBOXONE CONFIRMATORY/DEFINITIVE, URINE, BY LC-MS/MS (PERFORMABLE)
BUPRENORPHINE: 36 ng/mL — ABNORMAL HIGH (ref <10)
NALOXONE: 19 ng/mL — ABNORMAL HIGH (ref <5)
NORBUPRENORPHINE: 134 ng/mL — ABNORMAL HIGH (ref <10)

## 2023-10-15 LAB — NOTES AND COMMENTS

## 2023-10-15 LAB — HISTOPLASMA GALACTOMANNAN ANTIGEN, URINE

## 2023-10-15 LAB — ADULT ROUTINE BLOOD CULTURE, SET OF 2 BOTTLES (BACTERIA AND YEAST)
BLOOD CULTURE, ROUTINE: NO GROWTH
BLOOD CULTURE, ROUTINE: NO GROWTH

## 2023-10-18 ENCOUNTER — Telehealth (HOSPITAL_BASED_OUTPATIENT_CLINIC_OR_DEPARTMENT_OTHER): Payer: Self-pay

## 2023-10-18 NOTE — Telephone Encounter (Signed)
Left a message for pt to return my call, left my number and ext. Called to check on wgt loss, nicotine, dental and pt need to go therapist for taking non prescription suboxtone. Will follow up in 2 weeks

## 2023-10-21 NOTE — Speech Therapy (Signed)
[facility]  Inpatient Speech Therapy  [address] [place], [place] [address]  Phone: [phone]  Fax: [phone]    Inpatient Swallowing Discharge Summary    Patient Name: [name] [name]    Date of Birth: 10-20-1966    Patient MRN: [id number]    Diet Recommendations: Regular and Thin/Level 0  Aspiration Precautions/Compensatory Strategies: Elevate HOB to a 90 degree seated position, Single sips, Small bites, Re-swallow with drinks , Swallow each bite prior to taking next bite, Take breaks with SOB , and Reflux precautions/remain upright after meals for 30 mins   Medication administration: Pills whole 1 at a time w/ recommended liquid consistency  Feeding Assistance: Assistance w/ tray set up and/or positioning and Monitor and assist at meals as needed 2/2 possible confusion   General plan of care: Therapeutic exercises to strengthen the swallowing mechanism , Compensatory strategies, Aspiration precautions, and Patient/family education/training  Recommended swallowing exercises: Hard/effortful swallow, Lingual Pullbacks, and Mendelsohn Maneuver     Discharge destination: Home w/ self care    Will contact patient and PCP regarding outpatient services for swallowing.     Therapist:   Valda Lamb, SLP

## 2023-10-26 ENCOUNTER — Other Ambulatory Visit: Payer: Self-pay

## 2023-11-08 ENCOUNTER — Telehealth (HOSPITAL_BASED_OUTPATIENT_CLINIC_OR_DEPARTMENT_OTHER): Payer: Self-pay

## 2023-11-08 ENCOUNTER — Other Ambulatory Visit (HOSPITAL_BASED_OUTPATIENT_CLINIC_OR_DEPARTMENT_OTHER): Payer: Self-pay | Admitting: Internal Medicine

## 2023-11-08 NOTE — Telephone Encounter (Signed)
Left a message for pt to return my call, left my number and ext. Checking on wgt and nicotine  and dental

## 2023-11-13 ENCOUNTER — Ambulatory Visit (INDEPENDENT_AMBULATORY_CARE_PROVIDER_SITE_OTHER): Payer: MEDICAID | Admitting: Neurological Surgery

## 2023-11-21 ENCOUNTER — Encounter (HOSPITAL_BASED_OUTPATIENT_CLINIC_OR_DEPARTMENT_OTHER): Payer: Self-pay

## 2023-11-22 ENCOUNTER — Other Ambulatory Visit (INDEPENDENT_AMBULATORY_CARE_PROVIDER_SITE_OTHER): Payer: Self-pay | Admitting: Family Medicine

## 2023-11-22 MED ORDER — THIAMINE MONONITRATE (VITAMIN B1) 100 MG TABLET
100.0000 mg | ORAL_TABLET | Freq: Every day | ORAL | 0 refills | Status: DC
Start: 2023-11-22 — End: 2024-01-02

## 2023-11-27 ENCOUNTER — Other Ambulatory Visit (INDEPENDENT_AMBULATORY_CARE_PROVIDER_SITE_OTHER): Payer: Self-pay | Admitting: Family Medicine

## 2023-11-27 MED ORDER — AMLODIPINE 10 MG TABLET
10.0000 mg | ORAL_TABLET | Freq: Every day | ORAL | 0 refills | Status: DC
Start: 2023-11-27 — End: 2023-12-30

## 2023-11-27 MED ORDER — FOLIC ACID 1 MG TABLET
1.0000 mg | ORAL_TABLET | Freq: Every day | ORAL | 0 refills | Status: DC
Start: 2023-11-27 — End: 2024-01-02

## 2023-12-04 ENCOUNTER — Encounter (INDEPENDENT_AMBULATORY_CARE_PROVIDER_SITE_OTHER): Payer: Self-pay | Admitting: Neurological Surgery

## 2023-12-04 ENCOUNTER — Ambulatory Visit (INDEPENDENT_AMBULATORY_CARE_PROVIDER_SITE_OTHER): Payer: MEDICAID

## 2023-12-04 ENCOUNTER — Ambulatory Visit (HOSPITAL_BASED_OUTPATIENT_CLINIC_OR_DEPARTMENT_OTHER)
Admission: RE | Admit: 2023-12-04 | Discharge: 2023-12-04 | Disposition: A | Discharge status: Home / Self Care | Payer: MEDICAID | Source: Ambulatory Visit

## 2023-12-04 ENCOUNTER — Ambulatory Visit: Payer: MEDICAID

## 2023-12-04 ENCOUNTER — Other Ambulatory Visit: Payer: Self-pay

## 2023-12-04 VITALS — BP 177/85 | HR 104 | Temp 82.6°F | Ht 74.0 in | Wt 286.2 lb

## 2023-12-04 DIAGNOSIS — M542 Cervicalgia: Secondary | ICD-10-CM

## 2023-12-04 DIAGNOSIS — Z981 Arthrodesis status: Secondary | ICD-10-CM

## 2023-12-04 DIAGNOSIS — M5459 Other low back pain: Secondary | ICD-10-CM

## 2023-12-04 NOTE — Progress Notes (Signed)
NEUROSURGERY, PHYSICIAN OFFICE CENTER  [address]  [place] [address]  Operated by [facility]  Progress Note    Name: [name] [name] MRN:  [id number]   Date: 12/04/2023 DOB:  1966/03/25 (57 y.o.)           Referring Provider:   Cecilie Kicks, DO  [address]  [place],  [place] [address]          Subjective:     [name] [name] is a 58 year old male with history of C3-C4 ACDF (Dr Michail Jewels on 02/05/23) who is s/p  L4-L5 PSF by Dr. Michail Jewels on 05/16/23.      Last seen in clinic on 08/09/2023 the patient reported that he felt his lumbar spine was healing well, he noted very minor residual numbness in the lateral toes of his bilateral feet which he noted was chronic. He reported that his low back pain had almost entirely resolved, but now he had severe pain in his right hip that has progressively worsened since August. He complains of severe, sharp right hip pain that radiates to the right groin, currently rates it as 9/10 in severity. He noted that his hip pain has worsened with movement of the right leg and walking, he felt it was causing some weakness in the right leg. Ordered XR Hip, MRI Hip and XR lumbar ap/lat.     Today 12/04/2023 the patient has been seen by orthopedics and is presently working to fulfill the requirement to have surgery. 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 Overall, aside from the hip pain the patient is doing well. His LBP is gone as with his neck pain and radiculopathy. He has no new or acute neurological deficits. Ordered XR cervical/lumbar, will await official read on imaging obtained today, will contact patient with any remarkable findings. RTC PRN.       Current Outpatient Medications   Medication Sig    acetaminophen (TYLENOL) 325 mg Oral Tablet Take 2 Tablets (650 mg total) by mouth Every 6 hours (Patient taking differently: Take 2 Tablets (650 mg total) by mouth Every 6  hours as needed)    allopurinoL (ZYLOPRIM) 100 mg Oral Tablet Take 1 Tablet (100 mg total) by mouth Twice daily    amLODIPine (NORVASC) 10 mg Oral Tablet Take 1 Tablet (10 mg total) by mouth Once a day for 30 days    buprenorphine HCl/naloxone HCl (SUBOXONE SL) Place 2 mg under the tongue Once a day ASKED PATIENT WHAT THE MG WAS ON THIS MEDICATION AND He STATES THAT He DOES NOT HAVE A PRESCRIPTION FOR IT He BUYS IT OFF THE "[employer]"  It's an 8mg  strip that he cuts into 8 pieces , uses 1-2 pieces at a time    buPROPion (WELLBUTRIN XL) 150 mg extended release 24 hr tablet Take 1 Tablet (150 mg total) by mouth Once a day Indications: stop smoking    docusate sodium (COLACE) 100 mg Oral Capsule Take 1 Capsule (100 mg total) by mouth Twice per day as needed for Constipation    folic acid (FOLVITE) 1 mg Oral Tablet Take 1 Tablet (1 mg total) by mouth Once a day for 30 days    Ibuprofen (MOTRIN) 200 mg Oral Tablet Take 4 Tablets (800 mg total) by mouth Four times a day as needed for Pain Last dose 11/8    ipratropium-albuterol  0.5 mg-3 mg(2.5 mg base)/3 mL Solution for Nebulization Take 3 mL by nebulization Every 4 hours as needed for up to 30 days    loperamide (IMODIUM) 2 mg Oral Capsule Take 1 Capsule (2 mg total) by mouth Every 4 hours as needed    magnesium oxide (MAG-OX) 400 mg Oral Tablet Take 1 Tablet (400 mg total) by mouth Once a day    thiamine mononitrate 100 mg Oral Tablet Take 1 Tablet (100 mg total) by mouth Once a day for 30 days     Allergies   Allergen Reactions    Naproxen Nausea/ Vomiting      Past Medical History:   Diagnosis Date    Cervical stenosis of spinal canal     Chronic gum disease     RISK OF TEETH FALLING OUT    Chronic obstructive pulmonary disease (CMS HCC) 04/29/2023    Chronic pain     COPD (chronic obstructive pulmonary disease) (CMS HCC)     CVD (cerebrovascular disease)     Exertional dyspnea 04/29/2023    Gait disturbance     Gout     History of anesthesia complications      PATIENT STATED TEETH AS RISK OF BEING KNOCKED OUT DUE TO GUM DISEASE    History of substance use 04/29/2023    Hyperlipidemia     Kidney stones     Laceration of leg 02/2023    Leg weakness, bilateral     Low back pain     Lumbar radiculopathy     MRSA (methicillin resistant Staphylococcus aureus) 10/10/2023    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/10/23    MRSA infection 2013    toe surgery    Neck pain     Neck problem     Oxygen dependent     Wears glasses      Past Surgical History:   Procedure Laterality Date    DISCECTOMY SPINE CERVICAL ANTERIOR MICRO WITH FUSION AND INSTRUMENTATION 1 LEVEL N/A 02/18/2023    Performed by Mariann Laster, MD at [facility] OR 5 [address]TH    FUSION SPINE POSTERIOR WITH INSTRUMENTATION TLIF AND PLIF L4-L5 N/A 05/16/2023    Performed by Mariann Laster, MD at [facility] OR 5 [facility]TH    HX CERVICAL SPINE SURGERY  02/18/2023    HX FOOT SURGERY Left     jont implant left toe    HX HERNIA REPAIR Left     inguinal    INJECTION CARPAL TUNNEL Left 06/24/2023    Performed by Ross Marcus, MD at [facility] OR PCOR    LITHOTRIPSY      LUMBAR DISC SURGERY  05/2023    RELEASE CARPAL TUNNEL Left 09/23/2023    Performed by Ross Marcus, MD at [facility] OR PCOR    RELEASE CARPAL TUNNEL Right 06/24/2023    Performed by Ross Marcus, MD at [facility] OR PCOR     Social History     Socioeconomic History    Marital status: Divorced     Spouse name: Not on file    Number of children: Not on file    Years of education: Not on file    Highest education level: Not on file   Occupational History    Not on file   Tobacco Use    Smoking status: Former     Current packs/day: 1.00     Types: Cigarettes     Passive exposure: Past    Smokeless tobacco: Former    Tobacco  comments:     Vapes daily    Vaping Use    Vaping status: Every Day    Substances: Nicotine   Substance and Sexual Activity    Alcohol use: Yes     Comment: drinks daily whatever he can get    Drug use: Not Currently     Comment: suboxone off black market    Sexual activity: Not Currently   Other Topics  Concern    Ability to Walk 1 Flight of Steps without SOB/CP No    Routine Exercise Not Asked    Ability to Walk 2 Flight of Steps without SOB/CP No    Unable to Ambulate Not Asked    Total Care Not Asked    Ability To Do Own ADL's Yes    Uses Walker Not Asked    Other Activity Level Not Asked    Uses Cane Not Asked   Social History Narrative    Not on file     Social Determinants of Health     Financial Resource Strain: Not on file   Transportation Needs: Not on file   Social Connections: Low Risk  (10/10/2023)    Social Connections     SDOH Social Isolation: 5 or more times a week   Intimate Partner Violence: Not on file   Housing Stability: Not on file         Objective:   Vital Signs:  There were no vitals taken for this visit.      Constitutional  General appearance: Normal  Cardiovascular:   Peripheral vascular system: Normal  HEENT:  Heent:  Normal  Musculoskeletal  Gait and Station: : Abnormal: antalgic, ambulates with a cane, weakness LLE  Muscle strength (upper extremities): : Normal  Muscle strength (lower extremities): : Abnormal: decreased BLE  Muscle tone (upper extremities): : Normal  Muscle tone (lower extremities): : Normal  Sensation: Normal  Deep tendon reflexes upper and lower extremities: Normal  Hoffman's reflex: Left: negative Right: negative  Ankle clonus:Left : Not present Right Not present  Straight leg raise: Left: negative Right: negative  Patrick's sign: Left: positive Right: positive  Musculoskeletal tenderness: positive left hip    Neurological  Orientation: Normal  Recent and remote memory: Normal  Attention span and concentration: Normal  Language: Normal  Fund of knowledge: Normal    Data reviewed    No new imaging to review.  Ordered XR Lumbar/Cervical today  Awaiting official read on imaging obtained today, will contact patient with any remarkable findings      Discussions with other providers:   Reviewed chart notes    Assessment:      ICD-10-CM    1. S/P lumbar fusion  Z98.1 XR  CERVICAL SPINE AP AND LATERAL     XR LUMBAR SPINE AP AND LAT      2. S/P cervical spinal fusion  Z98.1 XR CERVICAL SPINE AP AND LATERAL     XR LUMBAR SPINE AP AND LAT           Recommendations:    -The natural history, film findings, and indications for treatment were discussed.  -XR Lumbar/Cervical today. Will await official read on imaging obtained today, will contact patient with any remarkable findings.  -RTC PRN  -The patient has been advised to follow up with their PCP in regards any chronic medical conditions and any non-neurosurgical symptoms that they may have   -Continue Medical Management (Diet, Exercise, Medication).      Lenard Lance, FNP-C  The patient was seen independently with the co-signing faculty present in clinic.

## 2023-12-07 DIAGNOSIS — Z981 Arthrodesis status: Secondary | ICD-10-CM

## 2023-12-07 DIAGNOSIS — M2578 Osteophyte, vertebrae: Secondary | ICD-10-CM

## 2023-12-07 DIAGNOSIS — M50322 Other cervical disc degeneration at C5-C6 level: Secondary | ICD-10-CM

## 2023-12-07 DIAGNOSIS — M50323 Other cervical disc degeneration at C6-C7 level: Secondary | ICD-10-CM

## 2023-12-09 DIAGNOSIS — Z4789 Encounter for other orthopedic aftercare: Secondary | ICD-10-CM

## 2023-12-27 ENCOUNTER — Ambulatory Visit (INDEPENDENT_AMBULATORY_CARE_PROVIDER_SITE_OTHER): Payer: MEDICAID | Admitting: Neurological Surgery

## 2023-12-30 ENCOUNTER — Other Ambulatory Visit (INDEPENDENT_AMBULATORY_CARE_PROVIDER_SITE_OTHER): Payer: Self-pay | Admitting: Family Medicine

## 2023-12-30 ENCOUNTER — Telehealth (INDEPENDENT_AMBULATORY_CARE_PROVIDER_SITE_OTHER): Payer: Self-pay | Admitting: Family Medicine

## 2023-12-30 MED ORDER — AMLODIPINE 10 MG TABLET
10.0000 mg | ORAL_TABLET | Freq: Every day | ORAL | 3 refills | Status: DC
Start: 2023-12-30 — End: 2023-12-31

## 2023-12-30 MED ORDER — ALLOPURINOL 100 MG TABLET
100.0000 mg | ORAL_TABLET | Freq: Two times a day (BID) | ORAL | 3 refills | Status: AC
Start: 2023-12-30 — End: ?

## 2023-12-30 NOTE — Telephone Encounter (Signed)
Called patient to schedule annual exam.

## 2023-12-30 NOTE — Telephone Encounter (Signed)
 Called to schedule annual exam.   No answer- left message.

## 2024-01-02 ENCOUNTER — Other Ambulatory Visit (INDEPENDENT_AMBULATORY_CARE_PROVIDER_SITE_OTHER): Payer: Self-pay | Admitting: Physician Assistant

## 2024-01-02 ENCOUNTER — Encounter (INDEPENDENT_AMBULATORY_CARE_PROVIDER_SITE_OTHER): Payer: Self-pay

## 2024-01-02 MED ORDER — FOLIC ACID 1 MG TABLET
1.0000 mg | ORAL_TABLET | Freq: Every day | ORAL | 0 refills | Status: DC
Start: 2024-01-02 — End: 2024-01-09

## 2024-01-02 MED ORDER — THIAMINE MONONITRATE (VITAMIN B1) 100 MG TABLET
100.0000 mg | ORAL_TABLET | Freq: Every day | ORAL | 0 refills | Status: AC
Start: 2024-01-02 — End: 2024-02-01

## 2024-01-02 NOTE — Telephone Encounter (Signed)
 Called and messaged to schedule annual exam.

## 2024-01-09 ENCOUNTER — Other Ambulatory Visit (INDEPENDENT_AMBULATORY_CARE_PROVIDER_SITE_OTHER): Payer: Self-pay | Admitting: Family Medicine

## 2024-06-22 ENCOUNTER — Other Ambulatory Visit: Payer: Self-pay

## 2024-11-23 ENCOUNTER — Other Ambulatory Visit: Payer: Self-pay

## 2024-11-23 ENCOUNTER — Emergency Department (HOSPITAL_COMMUNITY): Admission: RE | Admit: 2024-11-23 | Discharge: 2024-11-23 | Disposition: A | Payer: MEDICAID | Source: Ambulatory Visit

## 2024-11-23 ENCOUNTER — Emergency Department
Admission: EM | Admit: 2024-11-23 | Discharge: 2024-11-23 | Disposition: A | Discharge status: Home / Self Care | Payer: MEDICAID

## 2024-11-23 DIAGNOSIS — M1611 Unilateral primary osteoarthritis, right hip: Secondary | ICD-10-CM | POA: Insufficient documentation

## 2024-11-23 MED ORDER — KETOROLAC 60 MG/2 ML INTRAMUSCULAR SOLUTION
60.0000 mg | INTRAMUSCULAR | Status: AC
  Administered 2024-11-23: 60 mg via INTRAMUSCULAR
  Filled 2024-11-23: qty 2

## 2024-11-23 MED ORDER — KETOROLAC 10 MG TABLET: 10.0000 mg | ORAL_TABLET | Freq: Four times a day (QID) | ORAL | 0 refills | Status: AC | PRN

## 2024-11-23 MED ORDER — ALBUTEROL SULFATE 2.5 MG/3 ML (0.083 %) SOLUTION FOR NEBULIZATION
2.5000 mg | INHALATION_SOLUTION | RESPIRATORY_TRACT | Status: AC
  Administered 2024-11-23: 2.5 mg via RESPIRATORY_TRACT
  Filled 2024-11-23: qty 3

## 2024-11-23 MED ORDER — DEXAMETHASONE SODIUM PHOSPHATE (PF) 10 MG/ML INJECTION SOLUTION
10.0000 mg | INTRAMUSCULAR | Status: AC
  Administered 2024-11-23: 10 mg via INTRAMUSCULAR
  Filled 2024-11-23: qty 1

## 2024-11-23 NOTE — ED Provider Notes (Signed)
 Emergency Department    ED Provider: [facility] PA-C  CC:  Right hip pain  HPI:  [name] [name] is a 59 y.o. male who presents to the emergency department with chief complaint of chronic worsening right hip pain.  Patient states he has not seen his PCP in years.  Patient denies any head trauma or pain in any other location in the right hip.  Patient states he has not had known osteoarthritis of the right hip.  Patient has a house he anesthesia or urinary or bowel incontinence.  Patient denies any back pain.    REVIEW OF SYSTEMS: All systems listed below were reviewed and are negative unless otherwise noted in the report:  ROS was obtained as listed below:  Constitutional: Denies fevers or chills  Eyes: Denies vision changes  ENMT: Denies sore throat, trouble hearing, rhinitis  CV: Denies chest pain, palpitations  Resp: Denies shortness of breath  GI: Denies nausea, vomiting or diarrhea  GU: Denies dysuria, hematuria  MSK:  Positive right hip pain Denies recent trauma,   Skin: Denies new rashes  Neuro: Denies new numbness or tingling or weakness  Psychiatric: Denies depression  Endocrine: Denies unexpected weight loss  Heme: Denies bleeding disorders  Immunologic: Denies any immune disorders      PMH:    Past Medical History:   Diagnosis Date    Cervical stenosis of spinal canal     Chronic gum disease     RISK OF TEETH FALLING OUT    Chronic obstructive pulmonary disease (CMS HCC) 04/29/2023    Chronic pain     COPD (chronic obstructive pulmonary disease) (CMS HCC)     CVD (cerebrovascular disease)     Exertional dyspnea 04/29/2023    Gait disturbance     Gout     History of anesthesia complications     PATIENT STATED TEETH AS RISK OF BEING KNOCKED OUT DUE TO GUM DISEASE    History of substance use 04/29/2023    Hyperlipidemia     Kidney stones     Laceration of leg 02/2023    Leg weakness, bilateral     Low back pain     Lumbar radiculopathy     MRSA (methicillin resistant Staphylococcus aureus) 10/10/2023     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/10/23    MRSA infection 2013    toe surgery    Neck pain     Neck problem     Oxygen dependent     Wears glasses          Discharge Medication List as of 11/23/2024  3:08 PM        CONTINUE these medications which have NOT CHANGED    Details   acetaminophen  (TYLENOL ) 325 mg Oral Tablet Take 2 Tablets (650 mg total) by mouth Every 6 hours, OTC      allopurinoL  (ZYLOPRIM ) 100 mg Oral Tablet Take 1 Tablet (100 mg total) by mouth Twice daily, Disp-180 Tablet, R-3, E-Rx      amLODIPine  (NORVASC ) 10 mg Oral Tablet TAKE 1 TABLET BY MOUTH DAILY, Disp-90 Tablet, R-3, Normal      buprenorphine HCl/naloxone  HCl (SUBOXONE  SL) Place 2 mg under the tongue Once a day ASKED PATIENT WHAT THE MG WAS ON THIS MEDICATION AND He STATES THAT He DOES NOT HAVE A PRESCRIPTION FOR IT He BUYS IT OFF THE [employer]  It's an 8mg  strip that he cuts into 8 pieces , uses 1-2 pieces at a time,  Historical Med  buPROPion  (WELLBUTRIN  XL) 150 mg extended release 24 hr tablet Take 1 Tablet (150 mg total) by mouth Once a day Indications: stop smoking, Disp-30 Tablet, R-3, E-Rx      docusate sodium  (COLACE) 100 mg Oral Capsule Take 1 Capsule (100 mg total) by mouth Twice per day as needed for Constipation, No Print      folic acid  (FOLVITE ) 1 mg Oral Tablet TAKE 1 TABLET BY MOUTH DAILY, Disp-30 Tablet, R-0, Normal      ipratropium-albuterol  0.5 mg-3 mg(2.5 mg base)/3 mL Solution for Nebulization Take 3 mL by nebulization Every 4 hours as needed for up to 30 days, Disp-1 Each, R-0, E-Rx      loperamide  (IMODIUM ) 2 mg Oral Capsule Take 1 Capsule (2 mg total) by mouth Every 4 hours as needed, No Print      magnesium  oxide (MAG-OX) 400 mg Oral Tablet Take 1 Tablet (400 mg total) by mouth Once a day, No Print             PSH:    Past Surgical History:   Procedure Laterality Date    HX CERVICAL SPINE SURGERY  02/18/2023    HX FOOT SURGERY Left     jont implant left toe    HX HERNIA REPAIR Left     inguinal    LITHOTRIPSY       LUMBAR DISC SURGERY  05/2023         Social Hx:    Social History     Socioeconomic History    Marital status: Divorced     Spouse name: Not on file    Number of children: Not on file    Years of education: Not on file    Highest education level: Not on file   Occupational History    Not on file   Tobacco Use    Smoking status: Former     Current packs/day: 1.00     Types: Cigarettes     Passive exposure: Past    Smokeless tobacco: Former    Tobacco comments:     Vapes daily    Vaping Use    Vaping status: Every Day    Substances: Nicotine    Substance and Sexual Activity    Alcohol use: Yes     Comment: drinks daily whatever he can get    Drug use: Not Currently     Comment: suboxone  off black market    Sexual activity: Not Currently   Other Topics Concern    Ability to Walk 1 Flight of Steps without SOB/CP No    Routine Exercise Not Asked    Ability to Walk 2 Flight of Steps without SOB/CP No    Unable to Ambulate Not Asked    Total Care Not Asked    Ability To Do Own ADL's Yes    Uses Walker Not Asked    Other Activity Level Not Asked    Uses Cane Not Asked   Social History Narrative    Not on file     Social Determinants of Health     Financial Resource Strain: Not on file   Transportation Needs: Not on file   Social Connections: Low Risk (10/10/2023)    Social Connections     SDOH Social Isolation: 5 or more times a week   Intimate Partner Violence: Not on file   Housing Stability: Not on file     Family Hx:   Family History   Problem Relation Age of Onset  COPD Mother     Seizures Sister      Allergies: Allergies[1]    Above history reviewed with patient, changes are as documented.    Physical Exam:   Nursing note and vitals reviewed.  ED Triage Vitals [11/23/24 1104]   BP (Non-Invasive) (!) 171/87   Heart Rate 89   Respiratory Rate 20   Temperature 36.9 C (98.4 F)   SpO2 (!) 88 %   Weight 136 kg (300 lb)   Height 1.88 m (6' 2)     PHYSICAL EXAM:  VITAL SIGNS: Reviewed  GENERAL: No acute distress. Alert.    EYES: No injection, discharge or icterus.  Pupils equal round and reactive to light bilaterally.  ENT: Mucous membranes pink and moist. Pharynx without erythema or exudate.  External ears without trauma.  NECK: Supple.  No midline cervical tenderness.  RESPIRATORY: Airway patent. Symmetric chest wall rise.  Clear to auscultation bilaterally.  CARDIOVASCULAR: Warm and well perfused.  Regular rate and rhythm with no murmurs gallops or rubs.  ABDOMEN: Non-distended and non-tender.  SKIN: Acyanotic.  MSK:  Right hip pain to palpation Without deformity.  2+ pulses bilateral upper and lower extremities.  NEUROLOGICAL: Alert and cooperative.  Strength 5/5 bilateral upper and lower extremities. GCS 15.      Laboratory Results:  Labs Ordered/Reviewed - No data to display      Radiographical Imaging:   Results for orders placed or performed during the hospital encounter of 11/23/24   XR HIP RIGHT W PELVIS 2-3 VIEWS     Status: None    Narrative    ELSIE T Pett    RADIOLOGIST: Norleen Dempsey Gilles, MD    XR HIP RIGHT W PELVIS 2-3 VIEWS performed on 11/23/2024 12:59 PM    CLINICAL HISTORY: right hip pain  pt c/o right hip pain x years. pt states recent frequent falls.    TECHNIQUE:  3 views of the right hip including AP pelvis.    COMPARISON:  08/27/2023    FINDINGS:   No fracture.  No suspicious bone lesion. There is no evidence of AVN. There is persistent severe narrowing of the superior aspect of the right hip joint space with suspected mild degenerative remodeling along the superior aspect of the right femoral head. There are small osteophytes involving the right femoral head.  Normal alignment.  Soft tissues are unremarkable.  There are pedicle screws involving the L4 and L5 lumbar levels.      Impression    No acute findings. Degenerative changes involving the right hip.               Radiologist location ID: [id number]             Medical decision making and course:    Pertinent medical records reviewed. Labs ordered  and reviewed as above. Imaging ordered and reviewed as above.    Patient with significant osteoarthritis of the right hip noted on x-ray imaging.  Patient with significant improvement of symptoms with Toradol .  Patient will be given Toradol  on an outpatient follow up with Orthopedic surgery for further evaluation and recommendations.      EKG- No results found for this visit on 11/23/24 (from the past 720 hours).           Disposition: Discharged  Following the history, physical exam, and ED workup, the patient was deemed stable and suitable for discharge. The patient/caregiver was advised to return to the ED for any new or worsening symptoms.  Discharge medications, and follow-up instructions were discussed with the patient/caregiver in detail, who verbalizes understanding. The patient/caregiver is in agreement and is comfortable with the plan of care.    Disposition: Discharged         Current Discharge Medication List        START taking these medications.        Details   ketorolac  tromethamine  10 mg Tablet  Commonly known as: TORADOL    10 mg, Oral, EVERY 6 HOURS PRN  Qty: 20 Tablet  Refills: 0            CONTINUE these medications - NO CHANGES were made during your visit.        Details   acetaminophen  325 mg Tablet  Commonly known as: TYLENOL    650 mg, Oral, EVERY 6 HOURS  Refills: 0     allopurinol  100 mg Tablet  Commonly known as: ZYLOPRIM    100 mg, Oral, 2 TIMES DAILY  Qty: 180 Tablet  Refills: 3     amLODIPine  10 mg Tablet  Commonly known as: NORVASC    10 mg, Oral, Daily  Qty: 90 Tablet  Refills: 3     buPROPion  150 mg Tablet Extended Release 24 hr  Commonly known as: WELLBUTRIN  XL   150 mg, Oral, Daily  Qty: 30 Tablet  Refills: 3     docusate sodium  100 mg Capsule  Commonly known as: COLACE   100 mg, Oral, 2 TIMES DAILY PRN  Refills: 0     folic acid  1 mg Tablet  Commonly known as: FOLVITE    1 mg, Oral, Daily  Qty: 30 Tablet  Refills: 0     ipratropium-albuterol  0.5 mg-3 mg(2.5 mg base)/3 mL nebulizer  solution  Commonly known as: DUONEB   3 mL, Nebulization, EVERY 4 HOURS PRN  Qty: 1 Each  Refills: 0     loperamide  2 mg Capsule  Commonly known as: IMODIUM    2 mg, Oral, EVERY 4 HOURS PRN  Refills: 0     magnesium  oxide 400 mg Tablet  Commonly known as: MAG-OX   400 mg, Oral, Daily  Refills: 0     SUBOXONE  SL   2 mg, Sublingual, DAILY, ASKED PATIENT WHAT THE MG WAS ON THIS MEDICATION AND He STATES THAT He DOES NOT HAVE A PRESCRIPTION FOR IT He BUYS IT OFF THE [employer] It's an 8mg  strip that he cuts into 8 pieces , uses 1-2 pieces at a time  Refills: 0            Follow up:   Fontaine Coy, MD  [address] [address]  [phone]    Call in 1 day        Clinical Impression:   Clinical Impression   Osteoarthritis of right hip, unspecified osteoarthritis type (Primary)       Follow Up: Fontaine Coy, MD  [address] [address]  [phone]    Call in 1 day      Medications Prescribed:   Discharge Medication List as of 11/23/2024  3:08 PM        START taking these medications    Details   ketorolac  tromethamine  (TORADOL ) 10 mg Oral Tablet Take 1 Tablet (10 mg total) by mouth Every 6 hours as needed for Pain, Disp-20 Tablet, R-0, E-Rx             All or part of note was dictated with M*Modal. Misspellings  or grammar errors may be present.     [facility] Karle Desrosier PA-C         [1]   Allergies  Allergen Reactions    Naproxen  Nausea/ Vomiting

## 2024-11-23 NOTE — ED Nurses Note (Signed)
 Rechecked pt O2 in which he was 88%. Pt states he is normally at that % and wants to go home. Pt denies CP, SOB, HA, or dizziness. Pt states he feels fine. [facility] PA-C made aware.

## 2024-11-23 NOTE — Discharge Instructions (Addendum)
 Patient may take Toradol  1 tablet every 6 hours as needed for pain.    Patient will follow up with Dr. Fontaine for further evaluation

## 2024-11-23 NOTE — ED Nurses Note (Signed)
 Pt states he does not want oxygen on at this time. Pt states he wears it intermittently at home if he needs it. Pt was educated to notify nurse if he feels he needs O2 therapy in which he verbally understood.

## 2024-11-23 NOTE — ED Triage Notes (Signed)
 Pt presents to the ED for right hip pain, pt states that this pain is chronic but worse recently with multiple falls.  PT has not seen a PCP in years.

## 2024-11-23 NOTE — ED Nurses Note (Signed)

## 2024-12-02 ENCOUNTER — Ambulatory Visit: Payer: MEDICAID

## 2024-12-02 ENCOUNTER — Other Ambulatory Visit: Payer: Self-pay

## 2024-12-02 ENCOUNTER — Encounter (INDEPENDENT_AMBULATORY_CARE_PROVIDER_SITE_OTHER): Payer: Self-pay

## 2024-12-02 ENCOUNTER — Ambulatory Visit (HOSPITAL_BASED_OUTPATIENT_CLINIC_OR_DEPARTMENT_OTHER): Admission: RE | Admit: 2024-12-02 | Discharge: 2024-12-02 | Disposition: A | Payer: MEDICAID | Source: Ambulatory Visit

## 2024-12-02 DIAGNOSIS — M1611 Unilateral primary osteoarthritis, right hip: Secondary | ICD-10-CM | POA: Insufficient documentation

## 2024-12-02 NOTE — Nursing Note (Signed)
 Patient presents here today, using a cane to ambulate, for a 11/23/24 ED follow up for Right Hip Pain, Osteoarthritis of Right Hip showing on films.  Negative for trauma or injury. Patient was told that he needed hip replacement, but he was unable at the time. Patient rates his pain as a 8/10 today. Using Toradol  for pain relief.

## 2024-12-03 ENCOUNTER — Telehealth (INDEPENDENT_AMBULATORY_CARE_PROVIDER_SITE_OTHER): Payer: Self-pay

## 2024-12-03 DIAGNOSIS — Z01818 Encounter for other preprocedural examination: Secondary | ICD-10-CM

## 2024-12-03 DIAGNOSIS — M1611 Unilateral primary osteoarthritis, right hip: Secondary | ICD-10-CM

## 2024-12-03 DIAGNOSIS — M25551 Pain in right hip: Secondary | ICD-10-CM

## 2024-12-03 NOTE — Telephone Encounter (Signed)
 Called pt again today at 421pm-spoke w/ pt. Reports does not have active PCP, discussed needing to obtain one. Also, will need dental recommendations as has been some years since last dental visit. Advised will ask front office staff to call pt with some recommendations tomorrow and then once these appts are established, we can work on timeframe for a surgery date. Pt voiced understanding

## 2024-12-03 NOTE — Telephone Encounter (Signed)
 Per Dr Fontaine- plan for right THA. Needs scheduled.     Called pt at 1218 pm to discuss above- pt asked if we can call back later this afternoon. Advised we will try to accommodate with call back later today

## 2024-12-08 ENCOUNTER — Telehealth (HOSPITAL_COMMUNITY): Payer: Self-pay

## 2024-12-08 NOTE — Telephone Encounter (Signed)
 7:45 AM received message from [facility] at Dr. Lemuel office. [name] [name] needs to have joint replacement surgery. He will will need both medical and dental clearance. He has not seen a dentist or PCP in years. 7:47 AM received message from [place] that he has been seen at Dr. Veola office in the past and they are willing to see him.  11:15 AM spoke with [name] [name]. He has not heard back from Dr. Veola office regarding an appointment. I told him I would contact the office. Also discussed need for dental evaluation. He has [facility]. He will call them to find a local dentist that is covered under his plan.  11:45 AM Sent e-mail to Damien Nutting PPD Manager.  12:00 PM Patient has appt scheduled for 12/22/2024 with Dr. Deward.

## 2024-12-22 ENCOUNTER — Ambulatory Visit (INDEPENDENT_AMBULATORY_CARE_PROVIDER_SITE_OTHER): Payer: Self-pay | Admitting: Family Medicine

## 2025-01-13 ENCOUNTER — Ambulatory Visit (INDEPENDENT_AMBULATORY_CARE_PROVIDER_SITE_OTHER): Payer: MEDICAID | Admitting: Family Medicine
